# Patient Record
Sex: Female | Born: 1955
Health system: Southern US, Community
[De-identification: ages and names within clinical notes are randomized; demographics above are authoritative.]

## PROBLEM LIST (undated history)

## (undated) DIAGNOSIS — M810 Age-related osteoporosis without current pathological fracture: Secondary | ICD-10-CM

## (undated) DIAGNOSIS — I700 Atherosclerosis of aorta: Secondary | ICD-10-CM

## (undated) DIAGNOSIS — E039 Hypothyroidism, unspecified: Secondary | ICD-10-CM

## (undated) DIAGNOSIS — J449 Chronic obstructive pulmonary disease, unspecified: Secondary | ICD-10-CM

## (undated) DIAGNOSIS — J189 Pneumonia, unspecified organism: Secondary | ICD-10-CM

## (undated) DIAGNOSIS — E041 Nontoxic single thyroid nodule: Secondary | ICD-10-CM

## (undated) DIAGNOSIS — B009 Herpesviral infection, unspecified: Secondary | ICD-10-CM

## (undated) DIAGNOSIS — E785 Hyperlipidemia, unspecified: Secondary | ICD-10-CM

## (undated) DIAGNOSIS — J45909 Unspecified asthma, uncomplicated: Secondary | ICD-10-CM

## (undated) DIAGNOSIS — U071 COVID-19: Secondary | ICD-10-CM

## (undated) DIAGNOSIS — B977 Papillomavirus as the cause of diseases classified elsewhere: Secondary | ICD-10-CM

## (undated) DIAGNOSIS — I2510 Atherosclerotic heart disease of native coronary artery without angina pectoris: Secondary | ICD-10-CM

## (undated) DIAGNOSIS — K635 Polyp of colon: Secondary | ICD-10-CM

## (undated) DIAGNOSIS — D649 Anemia, unspecified: Secondary | ICD-10-CM

## (undated) DIAGNOSIS — J439 Emphysema, unspecified: Secondary | ICD-10-CM

## (undated) DIAGNOSIS — R911 Solitary pulmonary nodule: Secondary | ICD-10-CM

## (undated) HISTORY — DX: Atherosclerosis of aorta: I70.0

## (undated) HISTORY — DX: Herpesviral infection, unspecified: B00.9

## (undated) HISTORY — DX: Nontoxic single thyroid nodule: E04.1

## (undated) HISTORY — DX: Polyp of colon: K63.5

## (undated) HISTORY — DX: Age-related osteoporosis without current pathological fracture: M81.0

## (undated) HISTORY — PX: COLONOSCOPY: SHX174

## (undated) HISTORY — DX: Papillomavirus as the cause of diseases classified elsewhere: B97.7

## (undated) HISTORY — DX: COVID-19: U07.1

## (undated) HISTORY — DX: Hypothyroidism, unspecified: E03.9

## (undated) HISTORY — DX: Solitary pulmonary nodule: R91.1

## (undated) HISTORY — DX: Anemia, unspecified: D64.9

## (undated) HISTORY — DX: Emphysema, unspecified: J43.9

## (undated) HISTORY — DX: Chronic obstructive pulmonary disease, unspecified: J44.9

## (undated) HISTORY — DX: Atherosclerotic heart disease of native coronary artery without angina pectoris: I25.10

## (undated) HISTORY — DX: Unspecified asthma, uncomplicated: J45.909

## (undated) HISTORY — DX: Pneumonia, unspecified organism: J18.9

## (undated) HISTORY — DX: Hyperlipidemia, unspecified: E78.5

---

## 2009-09-28 ENCOUNTER — Ambulatory Visit: Payer: Self-pay | Admitting: Internal Medicine

## 2009-09-28 DIAGNOSIS — J029 Acute pharyngitis, unspecified: Secondary | ICD-10-CM

## 2009-09-28 DIAGNOSIS — J45909 Unspecified asthma, uncomplicated: Secondary | ICD-10-CM | POA: Insufficient documentation

## 2009-09-28 DIAGNOSIS — J4540 Moderate persistent asthma, uncomplicated: Secondary | ICD-10-CM | POA: Insufficient documentation

## 2009-09-28 DIAGNOSIS — D649 Anemia, unspecified: Secondary | ICD-10-CM | POA: Insufficient documentation

## 2009-09-28 DIAGNOSIS — E039 Hypothyroidism, unspecified: Secondary | ICD-10-CM | POA: Insufficient documentation

## 2009-09-28 HISTORY — DX: Unspecified asthma, uncomplicated: J45.909

## 2009-09-28 HISTORY — DX: Anemia, unspecified: D64.9

## 2009-09-28 HISTORY — DX: Hypothyroidism, unspecified: E03.9

## 2010-02-21 NOTE — Assessment & Plan Note (Signed)
Summary: SORE THROAT (OK PER DR K) // RS   Vital Signs:  Patient profile:   55 year old female Height:      64.5 inches Weight:      133 pounds BMI:     22.56 Temp:     98.3 degrees F oral BP sitting:   108 / 78  (right arm) Cuff size:   regular  Vitals Entered By: Duard Brady LPN (September 28, 2009 11:44 AM) CC: c/o sore throat x 2 wks on/off pain Is Patient Diabetic? Yes   CC:  c/o sore throat x 2 wks on/off pain.  History of Present Illness: 55 year old patient who is seen today with a two week history of intermittent sore throat.  She has a history of asthma.  Other symptoms include occasional postnasal drip, and intermittent productive cough.  There's been no fever, sore throat, is her primary symptom.  There has been no asthma, shortness of breath or wheezing.  She continues to smoke tobacco products.  Preventive Screening-Counseling & Management  Alcohol-Tobacco     Smoking Status: current     Smoking Cessation Counseling: yes  Allergies (verified): 1)  ! Sulfa  Past History:  Past Medical History: Hypothyroidism history of cervical dysplasia Asthma  Past Surgical History: none  Family History: Reviewed history and no changes required. father with history of prostate cancer mother history of breast cancer  Social History: Reviewed history and no changes required. Occupation:massage therapist Married Current Smoker Smoking Status:  current  Review of Systems       The patient complains of anorexia and prolonged cough.  The patient denies fever, weight loss, weight gain, vision loss, decreased hearing, hoarseness, chest pain, syncope, dyspnea on exertion, peripheral edema, headaches, hemoptysis, abdominal pain, melena, hematochezia, severe indigestion/heartburn, hematuria, incontinence, genital sores, muscle weakness, suspicious skin lesions, transient blindness, difficulty walking, depression, unusual weight change, abnormal bleeding, enlarged  lymph nodes, angioedema, and breast masses.    Physical Exam  General:  Well-developed,well-nourished,in no acute distress; alert,appropriate and cooperative throughout examination Head:  Normocephalic and atraumatic without obvious abnormalities. No apparent alopecia or balding. Eyes:  No corneal or conjunctival inflammation noted. EOMI. Perrla. Funduscopic exam benign, without hemorrhages, exudates or papilledema. Vision grossly normal. Ears:  erythema of the oral pharynx with tonsillar enlargement Mouth:  Oral mucosa and oropharynx without lesions or exudates.  Teeth in good repair. Neck:  No deformities, masses, or tenderness noted. Chest Wall:  No deformities, masses, or tenderness noted. Lungs:  Normal respiratory effort, chest expands symmetrically. Lungs are clear to auscultation, no crackles or wheezes. Abdomen:  Bowel sounds positive,abdomen soft and non-tender without masses, organomegaly or hernias noted.   Impression & Recommendations:  Problem # 1:  PHARYNGITIS-ACUTE (ICD-462)  Problem # 2:  HYPOTHYROIDISM (ICD-244.9)  Complete Medication List: 1)  Theophylline Cr 300 Mg Xr12h-tab (Theophylline) .... Bid 2)  Albuterol Sulfate (2.5 Mg/80ml) 0.083% Nebu (Albuterol sulfate) .... Neb tx 3)  Ipratropium Bromide 0.02 % Soln (Ipratropium bromide) .... Ned tx 4)  Nature-throid Tabs (thyroid)  .... Qd 5)  Progesterone Compound   Other Orders: Rapid Strep (16109)  Patient Instructions: 1)  Tobacco is very bad for your health and your loved ones! You Should stop smoking!. 2)  Take 400-600mg  of Ibuprofen (Advil, Motrin) with food every 4-6 hours as needed for relief of pain or comfort of fever.  Laboratory Results  Date/Time Received: September 28, 2009 12:05 PM  Date/Time Reported: September 28, 2009 12:05 PM  Other Tests  Rapid Strep: negative

## 2010-04-05 ENCOUNTER — Encounter: Payer: Self-pay | Admitting: Internal Medicine

## 2010-04-06 ENCOUNTER — Ambulatory Visit (INDEPENDENT_AMBULATORY_CARE_PROVIDER_SITE_OTHER): Payer: Self-pay | Admitting: Internal Medicine

## 2010-04-06 ENCOUNTER — Encounter: Payer: Self-pay | Admitting: Internal Medicine

## 2010-04-06 VITALS — BP 110/78 | Temp 98.8°F | Ht 64.5 in | Wt 132.0 lb

## 2010-04-06 DIAGNOSIS — G4700 Insomnia, unspecified: Secondary | ICD-10-CM

## 2010-04-06 DIAGNOSIS — J45909 Unspecified asthma, uncomplicated: Secondary | ICD-10-CM

## 2010-04-06 MED ORDER — ALPRAZOLAM 0.5 MG PO TABS: 0.5000 mg | ORAL_TABLET | Freq: Every evening | ORAL | Status: AC | PRN

## 2010-04-06 MED ORDER — FLUTICASONE PROPIONATE HFA 220 MCG/ACT IN AERO: 2.0000 | INHALATION_SPRAY | Freq: Two times a day (BID) | RESPIRATORY_TRACT | Status: DC

## 2010-04-06 MED ORDER — FLUCONAZOLE 150 MG PO TABS: 150.0000 mg | ORAL_TABLET | Freq: Once | ORAL | Status: AC

## 2010-04-06 NOTE — Patient Instructions (Signed)
Smoking tobacco is very bad for your health. You should stop smoking immediately.  Call or return to clinic prn if these symptoms worsen or fail to improve as anticipated.  

## 2010-04-06 NOTE — Progress Notes (Signed)
  Subjective:    Patient ID: Barbara Lawson, female    DOB: 1955/09/16, 55 y.o.   MRN: [id number]  HPI  a 55 year old patient who is seen in frequently with a history of asthma. She does quite well clinically and has no health insurance. She does use home nebulizer treatments but states she gets very little benefit from rescue MDI. She has been given samples of  Dulera  Which he has not been able to afford. She has done well with Flovent maintenance medication in the past. Her progress status seems fairly stable but does have daily symptoms requiring nebulizer treatments. She has a history of mild thyroid enlargement for which she takes low-dose suppressive therapy. She continues to smoke tobacco unfortunately.  Review of Systems  Constitutional: Negative.   HENT: Negative for hearing loss, congestion, sore throat, rhinorrhea, dental problem, sinus pressure and tinnitus.   Eyes: Negative for pain, discharge and visual disturbance.  Respiratory: Positive for wheezing. Negative for cough and shortness of breath.   Cardiovascular: Negative for chest pain, palpitations and leg swelling.  Gastrointestinal: Negative for nausea, vomiting, abdominal pain, diarrhea, constipation, blood in stool and abdominal distention.  Genitourinary: Negative for dysuria, urgency, frequency, hematuria, flank pain, vaginal bleeding, vaginal discharge, difficulty urinating, vaginal pain and pelvic pain.  Musculoskeletal: Negative for joint swelling, arthralgias and gait problem.  Skin: Negative for rash.  Neurological: Negative for dizziness, syncope, speech difficulty, weakness, numbness and headaches.  Hematological: Negative for adenopathy.  Psychiatric/Behavioral: Negative for behavioral problems, dysphoric mood and agitation. The patient is not nervous/anxious.        Objective:   Physical Exam  Constitutional: She is oriented to person, place, and time. She appears well-developed and well-nourished.  HENT:    Head: Normocephalic.  Right Ear: External ear normal.  Left Ear: External ear normal.  Mouth/Throat: Oropharynx is clear and moist.  Eyes: Conjunctivae and EOM are normal. Pupils are equal, round, and reactive to light.  Neck: Normal range of motion. Neck supple. No thyromegaly present.  Cardiovascular: Normal rate, regular rhythm, normal heart sounds and intact distal pulses.   Pulmonary/Chest: Effort normal. No respiratory distress. She has no wheezes. She has no rales.  Abdominal: Soft. Bowel sounds are normal. She exhibits no mass. There is no tenderness.  Musculoskeletal: Normal range of motion.  Lymphadenopathy:    She has no cervical adenopathy.  Neurological: She is alert and oriented to person, place, and time.  Skin: Skin is warm and dry. No rash noted.  Psychiatric: She has a normal mood and affect. Her behavior is normal.          Assessment & Plan:   chronic stable asthma. We'll give a prescription for Flovent. Samples of Dulera  Also dispensed. She will continue home nebulizer treatments when necessary. She does not  Wish  a prescription for rescue albuterol MDI which she feels is not effective

## 2011-03-26 ENCOUNTER — Telehealth: Payer: Self-pay

## 2011-03-26 NOTE — Telephone Encounter (Signed)
.  [facility] Barbara Lawson STATES SHE NEED RECORDS ON HER DAUGHTER AND HER DAUGHTER IS FAXING OVER A RELEASE FOR HER TO OBTAIN THEM REALLY NEED ALL SHOT RECORDS. PLEASE CALL [phone] WHEN READY FOR P/U WAS TOLD IT MAY BE 24-48 HRS.

## 2013-02-05 ENCOUNTER — Ambulatory Visit (INDEPENDENT_AMBULATORY_CARE_PROVIDER_SITE_OTHER): Payer: Self-pay | Admitting: Internal Medicine

## 2013-02-05 ENCOUNTER — Encounter: Payer: Self-pay | Admitting: Internal Medicine

## 2013-02-05 VITALS — BP 116/70 | HR 90 | Temp 98.4°F | Resp 20 | Ht 64.5 in | Wt 126.0 lb

## 2013-02-05 DIAGNOSIS — B9789 Other viral agents as the cause of diseases classified elsewhere: Principal | ICD-10-CM

## 2013-02-05 DIAGNOSIS — J069 Acute upper respiratory infection, unspecified: Secondary | ICD-10-CM

## 2013-02-05 MED ORDER — HYDROCODONE-HOMATROPINE 5-1.5 MG/5ML PO SYRP: 5.0000 mL | ORAL_SOLUTION | Freq: Four times a day (QID) | ORAL | Status: AC | PRN

## 2013-02-05 MED ORDER — AZITHROMYCIN 250 MG PO TABS: ORAL_TABLET | ORAL | Status: DC

## 2013-02-05 NOTE — Progress Notes (Signed)
Subjective:    Patient ID: Barbara Lawson, female    DOB: June 24, 1955, 58 y.o.   MRN: [id number]  HPI  58 year old patient who presents with a ten-day history of worsening cough congestion and some wheezing. She is a former smoker but quit 10 days ago at the onset of her acute illness. Cough has become productive of yellow and green sputum. She has developed a sinus congestion and right-sided headaches.  Past Medical History  Diagnosis Date  . ANEMIA-NOS 09/28/2009  . ASTHMA 09/28/2009  . HYPOTHYROIDISM 09/28/2009    History   Social History  . Marital Status: Married    Spouse Name: N/A    Number of Children: N/A  . Years of Education: N/A   Occupational History  . Not on file.   Social History Main Topics  . Smoking status: Former Smoker -- 1.00 packs/day    Types: Cigarettes    Quit date: 01/26/2013  . Smokeless tobacco: Not on file  . Alcohol Use: 1.5 oz/week    3 drink(s) per week  . Drug Use: No  . Sexual Activity: Not on file   Other Topics Concern  . Not on file   Social History Narrative  . No narrative on file    History reviewed. No pertinent past surgical history.  No family history on file.  Allergies  Allergen Reactions  . Sulfonamide Derivatives     Current Outpatient Prescriptions on File Prior to Visit  Medication Sig Dispense Refill  . albuterol (PROVENTIL) (2.5 MG/3ML) 0.083% nebulizer solution Take 2.5 mg by nebulization every 6 (six) hours as needed.        Marland Kitchen ipratropium (ATROVENT) 0.02 % nebulizer solution Take 500 mcg by nebulization 4 (four) times daily.        Marland Kitchen PROGESTERONE MICRONIZED PO Take by mouth daily.        . theophylline (THEOPHYLLINE) 300 MG 12 hr tablet Take 300 mg by mouth 2 (two) times daily.        . Thyroid (NATURE-THROID PO) Take by mouth.         No current facility-administered medications on file prior to visit.    BP 116/70  Pulse 90  Temp(Src) 98.4 F (36.9 C) (Oral)  Resp 20  Ht 5' 4.5" (1.638 m)  Wt 126 lb  (57.153 kg)  BMI 21.30 kg/m2  SpO2 97%       Review of Systems  Constitutional: Positive for activity change, appetite change and fatigue.  HENT: Positive for rhinorrhea and sinus pressure. Negative for congestion, dental problem, hearing loss, sore throat and tinnitus.   Eyes: Negative for pain, discharge and visual disturbance.  Respiratory: Positive for cough. Negative for shortness of breath.   Cardiovascular: Negative for chest pain, palpitations and leg swelling.  Gastrointestinal: Negative for nausea, vomiting, abdominal pain, diarrhea, constipation, blood in stool and abdominal distention.  Genitourinary: Negative for dysuria, urgency, frequency, hematuria, flank pain, vaginal bleeding, vaginal discharge, difficulty urinating, vaginal pain and pelvic pain.  Musculoskeletal: Negative for arthralgias, gait problem and joint swelling.  Skin: Negative for rash.  Neurological: Positive for headaches. Negative for dizziness, syncope, speech difficulty, weakness and numbness.  Hematological: Negative for adenopathy.  Psychiatric/Behavioral: Negative for behavioral problems, dysphoric mood and agitation. The patient is not nervous/anxious.        Objective:   Physical Exam  Constitutional: She is oriented to person, place, and time. She appears well-developed and well-nourished.  HENT:  Head: Normocephalic.  Right Ear: External ear normal.  Left Ear: External ear normal.  Mouth/Throat: Oropharynx is clear and moist.  Eyes: Conjunctivae and EOM are normal. Pupils are equal, round, and reactive to light.  Neck: Normal range of motion. Neck supple. No thyromegaly present.  Cardiovascular: Normal rate, regular rhythm, normal heart sounds and intact distal pulses.   Pulmonary/Chest: Effort normal and breath sounds normal.  Bilateral coarse rhonchi. Frequent paroxysms of cough. O2 saturation 97%  Abdominal: Soft. Bowel sounds are normal. She exhibits no mass. There is no tenderness.    Musculoskeletal: Normal range of motion.  Lymphadenopathy:    She has no cervical adenopathy.  Neurological: She is alert and oriented to person, place, and time.  Skin: Skin is warm and dry. No rash noted.  Psychiatric: She has a normal mood and affect. Her behavior is normal.          Assessment & Plan:   COPD exacerbation. Continue smoking cessation encouraged. We'll treat with expectorants antitussives and azithromycin

## 2013-02-05 NOTE — Progress Notes (Signed)
Pre-visit discussion using our clinic review tool. No additional management support is needed unless otherwise documented below in the visit note.  

## 2013-02-05 NOTE — Patient Instructions (Signed)
Smoking tobacco is very bad for your health. You should stop smoking immediately.    Take over-the-counter expectorants and cough medications such as  Mucinex DM.  Call if there is no improvement in 5 to 7 days or if he developed worsening cough, fever, or new symptoms, such as shortness of breath or chest pain.  Take your antibiotic as prescribed until ALL of it is gone, but stop if you develop a rash, swelling, or any side effects of the medication.  Contact our office as soon as possible if  there are side effects of the medication.

## 2013-06-02 ENCOUNTER — Ambulatory Visit (INDEPENDENT_AMBULATORY_CARE_PROVIDER_SITE_OTHER): Payer: Self-pay | Admitting: Internal Medicine

## 2013-06-02 ENCOUNTER — Encounter: Payer: Self-pay | Admitting: Internal Medicine

## 2013-06-02 ENCOUNTER — Telehealth: Payer: Self-pay | Admitting: Internal Medicine

## 2013-06-02 VITALS — BP 100/68 | HR 75 | Temp 98.1°F | Resp 18 | Ht 64.5 in | Wt 126.0 lb

## 2013-06-02 DIAGNOSIS — J329 Chronic sinusitis, unspecified: Secondary | ICD-10-CM

## 2013-06-02 DIAGNOSIS — J45909 Unspecified asthma, uncomplicated: Secondary | ICD-10-CM

## 2013-06-02 DIAGNOSIS — E039 Hypothyroidism, unspecified: Secondary | ICD-10-CM

## 2013-06-02 MED ORDER — DOXYCYCLINE HYCLATE 100 MG PO TABS: 100.0000 mg | ORAL_TABLET | Freq: Two times a day (BID) | ORAL | Status: DC

## 2013-06-02 MED ORDER — BECLOMETHASONE DIPROPIONATE 80 MCG/ACT IN AERS: 1.0000 | INHALATION_SPRAY | Freq: Two times a day (BID) | RESPIRATORY_TRACT | Status: DC

## 2013-06-02 MED ORDER — ALPRAZOLAM 0.25 MG PO TABS: 0.2500 mg | ORAL_TABLET | Freq: Every evening | ORAL | Status: DC | PRN

## 2013-06-02 NOTE — Telephone Encounter (Signed)
Called received from Jefferson at [facility], [place]) to get clarification on rx written today for ALPRAZolam (XANAX) 0.25 MG tablet.  Per Jaclyn Shaggy pt has been getting clonazepam filled that is being prescribed by another provider.  Will the rx for alprazolam wrote today replace the clonazepam?

## 2013-06-02 NOTE — Telephone Encounter (Signed)
FYI - spoke with pharmacist.  Patient has called today and requested a refill of Klonopin from Dr Laurence Aly in [facility].  Patient has been informed that she can not have both prescriptions (alprazolam and Klonopin).

## 2013-06-02 NOTE — Progress Notes (Signed)
Pre-visit discussion using our clinic review tool. No additional management support is needed unless otherwise documented below in the visit note.  

## 2013-06-02 NOTE — Progress Notes (Signed)
Subjective:    Patient ID: Barbara Lawson, female    DOB: [birth year], 58 y.o.   MRN: [id number]  HPI  58 year old patient who has a history of asthma.  She has been on Proventil and also theophylline for a number of years.  She discontinued tobacco use in January of this year and has had some chronic insomnia.  For the past 2 weeks has had increasing cough, sinus pain, congestion, and intermittent, low-grade fever. Her asthma generally has been improved.  She still smokes sporadically, but rarely and does use nicotine gum  Past Medical History  Diagnosis Date  . ANEMIA-NOS 09/28/2009  . ASTHMA 09/28/2009  . HYPOTHYROIDISM 09/28/2009    History   Social History  . Marital Status: Married    Spouse Name: N/A    Number of Children: N/A  . Years of Education: N/A   Occupational History  . Not on file.   Social History Main Topics  . Smoking status: Former Smoker -- 1.00 packs/day    Types: Cigarettes    Quit date: 01/26/2013  . Smokeless tobacco: Not on file  . Alcohol Use: 1.5 oz/week    3 drink(s) per week  . Drug Use: No  . Sexual Activity: Not on file   Other Topics Concern  . Not on file   Social History Narrative  . No narrative on file    History reviewed. No pertinent past surgical history.  No family history on file.  Allergies  Allergen Reactions  . Sulfonamide Derivatives     Current Outpatient Prescriptions on File Prior to Visit  Medication Sig Dispense Refill  . albuterol (PROVENTIL) (2.5 MG/3ML) 0.083% nebulizer solution Take 2.5 mg by nebulization every 6 (six) hours as needed.        Marland Kitchen ipratropium (ATROVENT) 0.02 % nebulizer solution Take 500 mcg by nebulization 4 (four) times daily.        Marland Kitchen PROGESTERONE MICRONIZED PO Take by mouth daily.        . Thyroid (NATURE-THROID PO) Take by mouth.         No current facility-administered medications on file prior to visit.    BP 100/68  Pulse 75  Temp(Src) 98.1 F (36.7 C) (Oral)  Resp 18  Ht 5'  4.5" (1.638 m)  Wt 126 lb (57.153 kg)  BMI 21.30 kg/m2  SpO2 98%       Review of Systems  Constitutional: Positive for fever, activity change and fatigue.  HENT: Positive for congestion, postnasal drip and sinus pressure. Negative for dental problem, hearing loss, rhinorrhea, sore throat and tinnitus.   Eyes: Negative for pain, discharge and visual disturbance.  Respiratory: Positive for cough. Negative for shortness of breath.   Cardiovascular: Negative for chest pain, palpitations and leg swelling.  Gastrointestinal: Negative for nausea, vomiting, abdominal pain, diarrhea, constipation, blood in stool and abdominal distention.  Genitourinary: Negative for dysuria, urgency, frequency, hematuria, flank pain, vaginal bleeding, vaginal discharge, difficulty urinating, vaginal pain and pelvic pain.  Musculoskeletal: Negative for arthralgias, gait problem and joint swelling.  Skin: Negative for rash.  Neurological: Positive for headaches. Negative for dizziness, syncope, speech difficulty, weakness and numbness.  Hematological: Negative for adenopathy.  Psychiatric/Behavioral: Negative for behavioral problems, dysphoric mood and agitation. The patient is not nervous/anxious.        Objective:   Physical Exam  Constitutional: She is oriented to person, place, and time. She appears well-developed and well-nourished.  HENT:  Head: Normocephalic.  Right Ear: External ear normal.  Left Ear: External ear normal.  Mouth/Throat: Oropharynx is clear and moist.  Eyes: Conjunctivae and EOM are normal. Pupils are equal, round, and reactive to light.  Neck: Normal range of motion. Neck supple. No thyromegaly present.  Cardiovascular: Normal rate, regular rhythm, normal heart sounds and intact distal pulses.   Pulmonary/Chest: Effort normal and breath sounds normal.  Abdominal: Soft. Bowel sounds are normal. She exhibits no mass. There is no tenderness.  Musculoskeletal: Normal range of motion.    Lymphadenopathy:    She has no cervical adenopathy.  Neurological: She is alert and oriented to person, place, and time.  Skin: Skin is warm and dry. No rash noted.  Psychiatric: She has a normal mood and affect. Her behavior is normal.          Assessment & Plan:   Subacute sinusitis.  Will treatment with doxycycline for 10 days Asthma.  We'll taper and discontinue theophylline and substitute ICS Insomnia.  Hopefully, improve off theophylline and once acute illness, resolved.  We'll treat with short term Xanax per patient request No health insurance.  CPX when available

## 2013-06-02 NOTE — Telephone Encounter (Signed)
Yes alprazolam will replace Klonopin

## 2013-06-02 NOTE — Patient Instructions (Signed)
Taper and discontinue theophylline   Use saline irrigation, warm  moist compresses and over-the-counter decongestants only as directed.  Call if there is no improvement in 5 to 7 days, or sooner if you develop increasing pain, fever, or any new symptoms.

## 2014-06-16 ENCOUNTER — Telehealth: Payer: Self-pay | Admitting: Family Medicine

## 2014-06-16 NOTE — Telephone Encounter (Signed)
Left a message for the pt to return my call.  Need to see if she has had her annual mammogram.  If so, where and when.

## 2015-02-16 DIAGNOSIS — E8941 Symptomatic postprocedural ovarian failure: Secondary | ICD-10-CM | POA: Insufficient documentation

## 2015-02-16 DIAGNOSIS — N952 Postmenopausal atrophic vaginitis: Secondary | ICD-10-CM | POA: Insufficient documentation

## 2015-09-06 DIAGNOSIS — Z8619 Personal history of other infectious and parasitic diseases: Secondary | ICD-10-CM | POA: Insufficient documentation

## 2015-10-03 DIAGNOSIS — R059 Cough, unspecified: Secondary | ICD-10-CM | POA: Insufficient documentation

## 2015-10-03 DIAGNOSIS — R490 Dysphonia: Secondary | ICD-10-CM | POA: Insufficient documentation

## 2015-12-27 DIAGNOSIS — Z1211 Encounter for screening for malignant neoplasm of colon: Secondary | ICD-10-CM | POA: Insufficient documentation

## 2015-12-27 DIAGNOSIS — Z0000 Encounter for general adult medical examination without abnormal findings: Secondary | ICD-10-CM | POA: Insufficient documentation

## 2016-02-03 DIAGNOSIS — F419 Anxiety disorder, unspecified: Secondary | ICD-10-CM | POA: Insufficient documentation

## 2016-12-15 DIAGNOSIS — D72825 Bandemia: Secondary | ICD-10-CM | POA: Insufficient documentation

## 2017-01-21 LAB — HM COLONOSCOPY

## 2017-03-28 LAB — HM PAP SMEAR: HM Pap smear: NEGATIVE

## 2018-12-03 DIAGNOSIS — S93401A Sprain of unspecified ligament of right ankle, initial encounter: Secondary | ICD-10-CM | POA: Insufficient documentation

## 2019-08-24 LAB — HM DEXA SCAN

## 2019-12-09 LAB — HM MAMMOGRAPHY

## 2020-01-06 DIAGNOSIS — R82994 Hypercalciuria: Secondary | ICD-10-CM | POA: Insufficient documentation

## 2020-05-31 ENCOUNTER — Telehealth: Payer: Self-pay | Admitting: Internal Medicine

## 2020-05-31 ENCOUNTER — Encounter: Payer: Self-pay | Admitting: Internal Medicine

## 2020-05-31 ENCOUNTER — Other Ambulatory Visit: Payer: Self-pay

## 2020-05-31 ENCOUNTER — Ambulatory Visit (INDEPENDENT_AMBULATORY_CARE_PROVIDER_SITE_OTHER): Payer: Medicare Other | Admitting: Internal Medicine

## 2020-05-31 VITALS — BP 106/78 | HR 99 | Temp 98.5°F | Ht 63.86 in | Wt 117.0 lb

## 2020-05-31 DIAGNOSIS — I2510 Atherosclerotic heart disease of native coronary artery without angina pectoris: Secondary | ICD-10-CM | POA: Insufficient documentation

## 2020-05-31 DIAGNOSIS — R82994 Hypercalciuria: Secondary | ICD-10-CM

## 2020-05-31 DIAGNOSIS — J449 Chronic obstructive pulmonary disease, unspecified: Secondary | ICD-10-CM | POA: Diagnosis not present

## 2020-05-31 DIAGNOSIS — R634 Abnormal weight loss: Secondary | ICD-10-CM

## 2020-05-31 DIAGNOSIS — E559 Vitamin D deficiency, unspecified: Secondary | ICD-10-CM

## 2020-05-31 DIAGNOSIS — M810 Age-related osteoporosis without current pathological fracture: Secondary | ICD-10-CM

## 2020-05-31 DIAGNOSIS — E041 Nontoxic single thyroid nodule: Secondary | ICD-10-CM

## 2020-05-31 DIAGNOSIS — E785 Hyperlipidemia, unspecified: Secondary | ICD-10-CM

## 2020-05-31 DIAGNOSIS — E039 Hypothyroidism, unspecified: Secondary | ICD-10-CM

## 2020-05-31 DIAGNOSIS — R918 Other nonspecific abnormal finding of lung field: Secondary | ICD-10-CM

## 2020-05-31 DIAGNOSIS — Z1231 Encounter for screening mammogram for malignant neoplasm of breast: Secondary | ICD-10-CM

## 2020-05-31 DIAGNOSIS — Z0000 Encounter for general adult medical examination without abnormal findings: Secondary | ICD-10-CM

## 2020-05-31 DIAGNOSIS — R911 Solitary pulmonary nodule: Secondary | ICD-10-CM | POA: Diagnosis not present

## 2020-05-31 DIAGNOSIS — Z124 Encounter for screening for malignant neoplasm of cervix: Secondary | ICD-10-CM

## 2020-05-31 DIAGNOSIS — Z13818 Encounter for screening for other digestive system disorders: Secondary | ICD-10-CM

## 2020-05-31 DIAGNOSIS — B977 Papillomavirus as the cause of diseases classified elsewhere: Secondary | ICD-10-CM

## 2020-05-31 DIAGNOSIS — I10 Essential (primary) hypertension: Secondary | ICD-10-CM

## 2020-05-31 DIAGNOSIS — K635 Polyp of colon: Secondary | ICD-10-CM

## 2020-05-31 DIAGNOSIS — N83202 Unspecified ovarian cyst, left side: Secondary | ICD-10-CM

## 2020-05-31 DIAGNOSIS — Z87891 Personal history of nicotine dependence: Secondary | ICD-10-CM

## 2020-05-31 DIAGNOSIS — E7841 Elevated Lipoprotein(a): Secondary | ICD-10-CM

## 2020-05-31 DIAGNOSIS — Z1389 Encounter for screening for other disorder: Secondary | ICD-10-CM

## 2020-05-31 DIAGNOSIS — E069 Thyroiditis, unspecified: Secondary | ICD-10-CM

## 2020-05-31 DIAGNOSIS — B009 Herpesviral infection, unspecified: Secondary | ICD-10-CM

## 2020-05-31 DIAGNOSIS — I700 Atherosclerosis of aorta: Secondary | ICD-10-CM

## 2020-05-31 DIAGNOSIS — E7849 Other hyperlipidemia: Secondary | ICD-10-CM

## 2020-05-31 MED ORDER — VALACYCLOVIR HCL 1 G PO TABS: ORAL_TABLET | ORAL | 3 refills | Status: DC

## 2020-05-31 MED ORDER — HYDROCHLOROTHIAZIDE 25 MG PO TABS: 12.5000 mg | ORAL_TABLET | Freq: Every day | ORAL | 0 refills | Status: DC

## 2020-05-31 MED ORDER — ATORVASTATIN CALCIUM 10 MG PO TABS: 10.0000 mg | ORAL_TABLET | Freq: Every day | ORAL | 3 refills | Status: DC

## 2020-05-31 MED ORDER — THYROID 15 MG PO TABS: ORAL_TABLET | ORAL | Status: DC

## 2020-05-31 NOTE — Progress Notes (Signed)
Chief Complaint  Patient presents with  . Establish Care   New patient complicated with multiple medical chronic conditions stable moved from [place] and needs to establish new specialists  1. Hypothyroidism with h/o thyroiditis (I.e hashimotos), thyroid nodules right side on armour thyroid 45 mg M-F and 30 mg Sat and Sunday TSH uncontrolled with h/o osteoporosis on actonel 150 mg Q30 days and h/o hypercalciuria will refer to endocrine  Results for Barbara, Lawson (MRN [id number]) as of 06/28/2020 18:31  06/23/2020 08:55 TSH: 4.90 (H) Triiodothyronine,Free,Serum: 3.9 T4,Free(Direct): 0.84 Thyroperoxidase Ab SerPl-aCnc: 183 (H)  2.htn BP controlled on hctz 12.5 or 25 mg qd /familial hld with h/o elevated lipoprotein A, CAD, AA -will refer to cardiology Dr. Debara Pickett and [facility] cards stable no chest pain just establish care  She is on lipitor 10 mg qhs lipid normal  Results for Barbara, Lawson (MRN [id number]) as of 06/28/2020 18:50  06/23/2020 08:55 Total CHOL/HDL Ratio: 3 Cholesterol: 164 HDL Cholesterol: 56.00 LDL (calc): 93 NonHDL: 108.25 Triglycerides: 78.0 VLDL: 15.6   3. H/o herpes and takes prn valtrex will refill  4. H/o moderate persistent asthma/COPD stable, former smoker and copd on symbicort, prn combivent with h/o lung nodules see prior CT chest in A/P of note  She needs to establish with new pulmonologist refer [facility] in [place] on theophylline 200 mg in am and 1/2 pill qhs I do not Rx this informed pt   5. H/o colon polyps will refer to [facility] in [place] for f/u h/o 20+polyps in the past last [facility] [place]  6. H/o anxiety/chronic insomnia her holistic md Rx klonopin taking 0.25 mg qhs and hydroxyzine 50 mg qhs and hydrocodone 7.5-325 mg qd  7. H/o abnormal pap needs to see ob/gyn for f/u  Will refer     Review of Systems  Constitutional: Negative for weight loss.  HENT: Negative for hearing loss.   Eyes: Negative for blurred vision.  Respiratory: Negative for  shortness of breath.   Cardiovascular: Negative for chest pain.  Gastrointestinal: Negative for abdominal pain.  Musculoskeletal: Negative for back pain, falls, joint pain and neck pain.  Skin: Negative for rash.  Neurological: Negative for headaches.  Psychiatric/Behavioral: The patient is nervous/anxious and has insomnia.    Past Medical History:  Diagnosis Date  . ANEMIA-NOS 09/28/2009  . Aortic atherosclerosis ([facility])   . ASTHMA 09/28/2009   moderate persistent   . CAD (coronary artery disease)   . Colon polyps   . COPD (chronic obstructive pulmonary disease) ([place])   . COVID-19    01/20/2020  . Emphysema lung ([facility])    vs bronchitis;paraseptal   . Herpes   . HPV in female    hpv 20+ years   . Hyperlipemia   . HYPOTHYROIDISM 09/28/2009  . Lung nodule   . Osteoporosis   . Pneumonia    hosp in 2018  . Thyroid nodule    right thyroid 5.2 x 1.8 x 1.9 cm right lobe   Past Surgical History:  Procedure Laterality Date  . COLONOSCOPY     Family History  Problem Relation Age of Onset  . Cancer Mother   . Asthma Father   . Cancer Father   . Hyperlipidemia Father   . Hyperlipidemia Brother    Social History   Socioeconomic History  . Marital status: Married    Spouse name: Not on file  . Number of children: Not on file  . Years of education: Not on file  . Highest education  level: Not on file  Occupational History  . Not on file  Tobacco Use  . Smoking status: Former Smoker    Packs/day: 1.00    Types: Cigarettes    Quit date: 01/26/2013    Years since quitting: 7.4  . Smokeless tobacco: Never Used  Substance and Sexual Activity  . Alcohol use: Yes    Alcohol/week: 3.0 standard drinks    Types: 3 Standard drinks or equivalent per week  . Drug use: No  . Sexual activity: Not on file  Other Topics Concern  . Not on file  Social History Narrative   Lives alone with dog    [place]    Bachelors degree massage therapy    Age 29 y.o abortion    lmp age 8     Social Determinants of Health   Financial Resource Strain: Not on file  Food Insecurity: Not on file  Transportation Needs: Not on file  Physical Activity: Not on file  Stress: Not on file  Social Connections: Not on file  Intimate Partner Violence: Not on file   Current Meds  Medication Sig  . ascorbic acid (VITAMIN C) 1000 MG tablet Take by mouth.  . budesonide-formoterol (SYMBICORT) 80-4.5 MCG/ACT inhaler Inhale into the lungs.  . calcium citrate-vitamin D (CITRACAL+D) 315-200 MG-UNIT tablet Take 2 tablets by mouth daily.  . clonazePAM (KLONOPIN) 0.5 MG tablet Take 0.25 mg by mouth at bedtime. Outside prescriber holistic MD Dr. Cherre Blanc  . hydrochlorothiazide (HYDRODIURIL) 25 MG tablet Take 0.5 tablets (12.5 mg total) by mouth daily. (Patient taking differently: Take 25 mg by mouth daily. Verify dose at f/u 12.5 or 25 mg qd)  . HYDROcodone-acetaminophen (NORCO) 7.5-325 MG tablet Take by mouth. From holistic MD Dr. Cherre Blanc  . hydrOXYzine (VISTARIL) 25 MG capsule Take 50 mg by mouth at bedtime.  Marland Kitchen ipratropium (ATROVENT) 0.02 % nebulizer solution Take 500 mcg by nebulization 4 (four) times daily.  . Ipratropium-Albuterol (COMBIVENT RESPIMAT) 20-100 MCG/ACT AERS respimat INHALE ONE PUFF INTO THE LUNGS 4 TIMES DAILY  . Ipratropium-Albuterol (COMBIVENT RESPIMAT) 20-100 MCG/ACT AERS respimat Inhale 1 puff into the lungs every 6 (six) hours as needed for wheezing.  . progesterone (PROMETRIUM) 100 MG capsule Take 200 mg by mouth daily.  Marland Kitchen PROGESTERONE MICRONIZED PO Take by mouth daily.  . risedronate (ACTONEL) 150 MG tablet Take 150 mg by mouth every 30 (thirty) days.  . theophylline (THEODUR) 300 MG 12 hr tablet Take by mouth in the morning and at bedtime. Whole pill in the morning, half pill at night  . [DISCONTINUED] atorvastatin (LIPITOR) 10 MG tablet Take 1 tablet by mouth daily.  . [DISCONTINUED] hydrochlorothiazide (HYDRODIURIL) 25 MG tablet Take 25 mg by  mouth daily.  . [DISCONTINUED] thyroid (ARMOUR) 15 MG tablet TAKE 3 TABLETS BY MOUTH EVERY DAY EXCEPT 2 TABLETS ON SATURDAY AND SUNDAY  . [DISCONTINUED] valACYclovir (VALTREX) 1000 MG tablet Take by mouth.   Allergies  Allergen Reactions  . Cat Hair Extract   . Dust Mite Extract   . Sulfonamide Derivatives    Recent Results (from the past 2160 hour(s))  Hepatitis C antibody     Status: None   Collection Time: 06/23/20  8:55 AM  Result Value Ref Range   Hepatitis C Ab NON-REACTIVE NON-REACTIVE   SIGNAL TO CUT-OFF 0.00 <1.00    Comment: . HCV antibody was non-reactive. There is no laboratory  evidence of HCV infection. . In most cases, no further action is required. However,  if recent HCV exposure is suspected, a test for HCV RNA (test code 7371631822) is suggested. . For additional information please refer to http://education.questdiagn[facility].com/faq/FAQ22v1 (This link is being provided for informational/ educational purposes only.) .   Vitamin D (25 hydroxy)     Status: None   Collection Time: 06/23/20  8:55 AM  Result Value Ref Range   VITD 49.00 30.00 - 100.00 ng/mL  T3, free     Status: None   Collection Time: 06/23/20  8:55 AM  Result Value Ref Range   T3, Free 3.9 2.3 - 4.2 pg/mL  T4, free     Status: None   Collection Time: 06/23/20  8:55 AM  Result Value Ref Range   Free T4 0.84 0.60 - 1.60 ng/dL    Comment: Specimens from patients who are undergoing biotin therapy and /or ingesting biotin supplements may contain high levels of biotin.  The higher biotin concentration in these specimens interferes with this Free T4 assay.  Specimens that contain high levels  of biotin may cause false high results for this Free T4 assay.  Please interpret results in light of the total clinical presentation of the patient.    Thyroid peroxidase antibody     Status: Abnormal   Collection Time: 06/23/20  8:55 AM  Result Value Ref Range   Thyroperoxidase Ab SerPl-aCnc 183 (H) 0 - 34 IU/mL   Urinalysis, Routine w reflex microscopic     Status: None   Collection Time: 06/23/20  8:55 AM  Result Value Ref Range   Specific Gravity, UA 1.007 1.005 - 1.030   pH, UA 7.0 5.0 - 7.5   Color, UA Yellow Yellow   Appearance Ur Clear Clear   Leukocytes,UA Negative Negative   Protein,UA Negative Negative/Trace   Glucose, UA Negative Negative   Ketones, UA Negative Negative   RBC, UA Negative Negative   Bilirubin, UA Negative Negative   Urobilinogen, Ur 0.2 0.2 - 1.0 mg/dL   Nitrite, UA Negative Negative   Microscopic Examination Comment     Comment: Microscopic not indicated and not performed.  TSH     Status: Abnormal   Collection Time: 06/23/20  8:55 AM  Result Value Ref Range   TSH 4.90 (H) 0.35 - 4.50 uIU/mL  CBC with Differential/Platelet     Status: None   Collection Time: 06/23/20  8:55 AM  Result Value Ref Range   WBC 8.3 4.0 - 10.5 K/uL   RBC 4.46 3.87 - 5.11 Mil/uL   Hemoglobin 13.4 12.0 - 15.0 g/dL   HCT 40.1 36.0 - 46.0 %   MCV 89.8 78.0 - 100.0 fl   MCHC 33.5 30.0 - 36.0 g/dL   RDW 13.8 11.5 - 15.5 %   Platelets 370.0 150.0 - 400.0 K/uL   Neutrophils Relative % 65.6 43.0 - 77.0 %   Lymphocytes Relative 23.1 12.0 - 46.0 %   Monocytes Relative 7.3 3.0 - 12.0 %   Eosinophils Relative 2.9 0.0 - 5.0 %   Basophils Relative 1.1 0.0 - 3.0 %   Neutro Abs 5.4 1.4 - 7.7 K/uL   Lymphs Abs 1.9 0.7 - 4.0 K/uL   Monocytes Absolute 0.6 0.1 - 1.0 K/uL   Eosinophils Absolute 0.2 0.0 - 0.7 K/uL   Basophils Absolute 0.1 0.0 - 0.1 K/uL  Lipid panel     Status: None   Collection Time: 06/23/20  8:55 AM  Result Value Ref Range   Cholesterol 164 0 - 200 mg/dL    Comment: ATP III  Classification       Desirable:  < 200 mg/dL               Borderline High:  200 - 239 mg/dL          High:  > = 240 mg/dL   Triglycerides 78.0 0.0 - 149.0 mg/dL    Comment: Normal:  <150 mg/dLBorderline High:  150 - 199 mg/dL   HDL 56.00 >39.00 mg/dL   VLDL 15.6 0.0 - 40.0 mg/dL   LDL Cholesterol 93 0  - 99 mg/dL   Total CHOL/HDL Ratio 3     Comment:                Men          Women1/2 Average Risk     3.4          3.3Average Risk          5.0          4.42X Average Risk          9.6          7.13X Average Risk          15.0          11.0                       NonHDL 108.25     Comment: NOTE:  Non-HDL goal should be 30 mg/dL higher than patient's LDL goal (i.e. LDL goal of < 70 mg/dL, would have non-HDL goal of < 100 mg/dL)  Comprehensive metabolic panel     Status: None   Collection Time: 06/23/20  8:55 AM  Result Value Ref Range   Sodium 142 135 - 145 mEq/L   Potassium 3.9 3.5 - 5.1 mEq/L   Chloride 106 96 - 112 mEq/L   CO2 23 19 - 32 mEq/L   Glucose, Bld 93 70 - 99 mg/dL   BUN 14 6 - 23 mg/dL   Creatinine, Ser 0.77 0.40 - 1.20 mg/dL   Total Bilirubin 0.4 0.2 - 1.2 mg/dL   Alkaline Phosphatase 57 39 - 117 U/L   AST 15 0 - 37 U/L   ALT 14 0 - 35 U/L   Total Protein 6.3 6.0 - 8.3 g/dL   Albumin 4.3 3.5 - 5.2 g/dL   GFR 81.17 >60.00 mL/min    Comment: Calculated using the CKD-EPI Creatinine Equation (2021)   Calcium 9.5 8.4 - 10.5 mg/dL   Objective  Body mass index is 20.17 kg/m. Wt Readings from Last 3 Encounters:  05/31/20 117 lb (53.1 kg)  06/02/13 126 lb (57.2 kg)  02/05/13 126 lb (57.2 kg)   Temp Readings from Last 3 Encounters:  05/31/20 98.5 F (36.9 C) (Oral)  06/02/13 98.1 F (36.7 C) (Oral)  02/05/13 98.4 F (36.9 C) (Oral)   BP Readings from Last 3 Encounters:  05/31/20 106/78  06/02/13 100/68  02/05/13 116/70   Pulse Readings from Last 3 Encounters:  05/31/20 99  06/02/13 75  02/05/13 90    Physical Exam Vitals and nursing note reviewed.  Constitutional:      Appearance: Normal appearance. She is well-developed and well-groomed.  HENT:     Head: Normocephalic and atraumatic.  Eyes:     Conjunctiva/sclera: Conjunctivae normal.     Pupils: Pupils are equal, round, and reactive to light.  Cardiovascular:     Rate and Rhythm: Normal rate and  regular rhythm.  Heart sounds: Normal heart sounds. No murmur heard.   Pulmonary:     Effort: Pulmonary effort is normal.     Breath sounds: Normal breath sounds.  Abdominal:     General: Abdomen is flat. Bowel sounds are normal.  Skin:    General: Skin is warm and dry.  Neurological:     General: No focal deficit present.     Mental Status: She is alert and oriented to person, place, and time. Mental status is at baseline.     Gait: Gait normal.  Psychiatric:        Attention and Perception: Attention and perception normal.        Mood and Affect: Mood and affect normal.        Speech: Speech normal.        Behavior: Behavior normal. Behavior is cooperative.        Thought Content: Thought content normal.        Cognition and Memory: Cognition and memory normal.        Judgment: Judgment normal.     Assessment  Plan  CAD wo angina/HLD familial elevated lpa, AA - Plan: Ambulatory referral to Cardiology, Ambulatory referral to Cardiology [facility] [place] and Dr. Debara Pickett   Chronic obstructive pulmonary disease, stable former smoker h/o lung nodules and moderate persistent asthma - Plan: Ambulatory referral to Pulmonology [facility] in [place] Lung nodule - Plan: Ambulatory referral to Pulmonology  They will need to determine if she needs to continue theophylline 300 qam and 150 qpm  Cont symbicort, combivent prn   Thyroid nodule, hypothyroidism hashimotos thyroiditis, osteoporosis and hyercalciuria  - Plan: thyroid (ARMOUR) 15 MG tablet, Ambulatory referral to Endocrinology,  Thyroiditis - Plan: TSH, T4, free, Thyroid peroxidase antibody, T3, free, thyroid (ARMOUR) 45 mg M-F and Sat/sunday 30 mg qd   Vitamin D deficiency - Plan: Vitamin D (25 hydroxy) Results for Barbara, Lawson (MRN [id number]) as of 06/28/2020 18:59  Ref. Range 06/23/2020 08:55  VITD Latest Ref Range: 30.00 - 100.00 ng/mL 49.00    Polyp of colon,multiple - Plan: Ambulatory referral to Gastroenterology [facility] referred   [facility]    HPV in female - Plan: Ambulatory referral to Obstetrics / Gynecology [facility] due for pap Cyst of left ovary - Plan: Ambulatory referral to Obstetrics / Gynecology referred [facility] Herpes - Plan: Ambulatory referral to Obstetrics / Gynecology  Hypertension, controlled  On hctz 12.5 or 25 mg qd confirm dose   HM Flu shot not had  Tdap utd 10/20/19  shingrix 2/2  prevnar utd 06/23/20  pna 23 utd [place] 3/3 consider booster   Hep C negative 06/23/20 Pap 03/28/17 negative consider in future h/o abnormal pap  Mammogram 12/09/19 negative  --referred [place] when due 11/2020  dexa +osteoporsis dexa 08/24/19  -referred to [facility] endocrine   Colonoscopy h/o 20+ polyps as of 05/31/20 3-4 years ago  -referred [facility]   H/o thyroid nodule 5.2 x 1.8 x 1.9 cm right lobe thyroid  H/o osteoporosis T score -2.9, -2.2, -2.3  H/o lung nodules Stress echo had 06/26/16   Results  Global LVEF (rest): Normal (LVEF >50%)  Global LVEF (stress): Hyperkinetic (LVEF >70%)  ECG  Normal sinus rhythm. Normal tracing  Arrhythmias  No rhythm abnormality.  Symptoms  No cardiovascular symptoms with maximal exercise.   TVUS/US 08/08/16 2.5 cm simple cyst left ovary    CT 09/25/2018 CT lung screen  IMPRESSION:  1. Lung-RADS 2, benign appearance or behavior. Continue annual  screening with  low-dose chest CT without contrast in 12 months.  2. Emphysema. (ICD10-J43.9)  3. Aortic Atherosclerois (ICD10-170.0)    Electronically Signed  By: Misty Stanley M.D.  On: 09/26/2018 08:23 CT chest 01/07/20  IMPRESSION:  1. 1. Lung-RADS 2S, benign appearance or behavior. Continue annual  screening with low-dose chest CT without contrast in 12 months.  2. The "S" modifier above refers to potentially clinically  significant non lung cancer related findings. Specifically, there is  aortic atherosclerosis, in addition to 2 vessel coronary artery  disease. Please note that although the presence of coronary  artery  calcium documents the presence of coronary artery disease, the  severity of this disease and any potential stenosis cannot be  assessed on this non-gated CT examination. Assessment for potential  risk factor modification, dietary therapy or pharmacologic therapy  may be warranted, if clinically indicated.  3. Mild diffuse bronchial wall thickening with mild centrilobular  and paraseptal emphysema; imaging findings suggestive of underlying  COPD.   Aortic Atherosclerosis (ICD10-I70.0) and Emphysema (ICD10-J43.9).    Electronically Signed  By: Vinnie Langton M.D.  On: 01/08/2020 10:15   Xray right ankle 11/28/2018  1. Age-indeterminate avulsion fracture at the tip of the lateral malleolus.  2. Mild soft tissue swelling overlying the lateral malleolus.  3. Joint spaces are maintained.  4. Calcaneal enthesopathy  As of 05/2020 Referred  [facility] pulm in [place]  [facility] cards in [place] and [place] for HLD Dr. Jayme Cloud Endocrine  Mammogram [place] [facility] ob[facility]gyn  GI [place] in [facility]    Specialists  Eye exam as of 05/31/20 3 years ago  Cardiology Dr. Ella Jubilee Dr. Elijio Miles  Endocrine Dr. Marva Panda seen 05/28/19  Integrative doctor Dr. Cherre Blanc  Dr. Tacy Dura GYN [facility]  [facility] Dr. Dennie Bible prior [facility]  Dermatology Dr. Orlene Och [facility] sch 11/28/20  Or[facility]o [facility], Dr. Birdena Jubilee [facility] sch 07/06/20  [facility] pulm Dr. Elijio Miles  Chiropractor  [facility] ob/gyn Dr. Durene Fruits [facility] 08/31/16 voiding dysfunction Dr. Ihor Dow Neurology [facility] Dr. Candise Bowens 2018 [facility] [facility] cardiology Dr. Toya Smothers     Time spent >50% time 60 minutes review of patient prior history and notes and imaging care everywhere, addressed health maintenance, face to face with patient 30 minutes, 30 minutes review of entire medical chart labs, prior imaging, notes, and Rx medications with referrals to new specialists in the  area   Provider: Dr. Olivia Mackie McLean-[facility]oc[facility]-Internal Medicine

## 2020-05-31 NOTE — Telephone Encounter (Signed)
Did pt leave her new patient paperwork which was not complete I do not have this call and I need this please ?

## 2020-05-31 NOTE — Patient Instructions (Addendum)
Consider prevnar vaccine here nurse visit  [place] or [place] for chiropractor if interested  Figure out who in [place] you saw for colonoscopy please and we need to get records asap  replens over the counter  Consider GYN for pap smear   [facility]   Cardiology Dr. Debara Pickett in [place] and regular cardiologist   [facility]ocrine [facility]    Consider GYN [facility], [facility], [place] [facility], [facility]   [facility]  [place] [address] [phone]   [facility] [place]  [address]  [place] [address]  [phone]   Pneumococcal Conjugate Vaccine (Prevnar 13) Suspension for Injection What is this medicine? PNEUMOCOCCAL VACCINE (NEU mo KOK al vak SEEN) is a vaccine used to prevent pneumococcus bacterial infections. These bacteria can cause serious infections like pneumonia, meningitis, and blood infections. This vaccine will lower your chance of getting pneumonia. If you do get pneumonia, it can make your symptoms milder and your illness shorter. This vaccine will not treat an infection and will not cause infection. This vaccine is recommended for infants and young children, adults with certain medical conditions, and adults 25 years or older. This medicine may be used for other purposes; ask your health care provider or pharmacist if you have questions. COMMON BRAND NAME(S): Prevnar, Prevnar 13 What should I tell my health care provider before I take this medicine? They need to know if you have any of these conditions:  bleeding problems  fever  immune system problems  an unusual or allergic reaction to pneumococcal vaccine, diphtheria toxoid, other vaccines, latex, other medicines, foods, dyes, or preservatives  pregnant or trying to get pregnant  breast-feeding How should I use this  medicine? This vaccine is for injection into a muscle. It is given by a health care professional. A copy of Vaccine Information Statements will be given before each vaccination. Read this sheet carefully each time. The sheet may change frequently. Talk to your pediatrician regarding the use of this medicine in children. While this drug may be prescribed for children as young as 56 weeks old for selected conditions, precautions do apply. Overdosage: If you think you have taken too much of this medicine contact a poison control center or emergency room at once. NOTE: This medicine is only for you. Do not share this medicine with others. What if I miss a dose? It is important not to miss your dose. Call your doctor or health care professional if you are unable to keep an appointment. What may interact with this medicine?  medicines for cancer chemotherapy  medicines that suppress your immune function  steroid medicines like prednisone or cortisone This list may not describe all possible interactions. Give your health care provider a list of all the medicines, herbs, non-prescription drugs, or dietary supplements you use. Also tell them if you smoke, drink alcohol, or use illegal drugs. Some items may interact with your medicine. What should I watch for while using this medicine? Mild fever and pain should go away in 3 days or less. Report any unusual symptoms to your doctor or health care professional. What side effects may I notice from receiving this medicine? Side effects that you should report to your doctor or health care professional as soon as possible:  allergic reactions like skin rash, itching or hives, swelling of the face, lips, or tongue  breathing problems  confused  fast or irregular heartbeat  fever over 102 degrees F  seizures  unusual bleeding or bruising  unusual muscle weakness Side effects that usually do not require medical attention (report to your doctor or  health care professional if they continue or are bothersome):  aches and pains  diarrhea  fever of 102 degrees F or less  headache  irritable  loss of appetite  pain, tender at site where injected  trouble sleeping This list may not describe all possible side effects. Call your doctor for medical advice about side effects. You may report side effects to FDA at 1-800-FDA-1088. Where should I keep my medicine? This does not apply. This vaccine is given in a clinic, pharmacy, doctor's office, or other health care setting and will not be stored at home. NOTE: This sheet is a summary. It may not cover all possible information. If you have questions about this medicine, talk to your doctor, pharmacist, or health care provider.  2021 Elsevier/Gold Standard (2013-10-15 10:27:27)  High Cholesterol  High cholesterol is a condition in which the blood has high levels of a white, waxy substance similar to fat (cholesterol). The liver makes all the cholesterol that the body needs. The human body needs small amounts of cholesterol to help build cells. A person gets extra or excess cholesterol from the food that he or she eats. The blood carries cholesterol from the liver to the rest of the body. If you have high cholesterol, deposits (plaques) may build up on the walls of your arteries. Arteries are the blood vessels that carry blood away from your heart. These plaques make the arteries narrow and stiff. Cholesterol plaques increase your risk for heart attack and stroke. Work with your health care provider to keep your cholesterol levels in a healthy range. What increases the risk? The following factors may make you more likely to develop this condition:  Eating foods that are high in animal fat (saturated fat) or cholesterol.  Being overweight.  Not getting enough exercise.  A family history of high cholesterol (familial hypercholesterolemia).  Use of tobacco products.  Having  diabetes. What are the signs or symptoms? There are no symptoms of this condition. How is this diagnosed? This condition may be diagnosed based on the results of a blood test.  If you are older than 65 years of age, your health care provider may check your cholesterol levels every 4-6 years.  You may be checked more often if you have high cholesterol or other risk factors for heart disease. The blood test for cholesterol measures:  "Bad" cholesterol, or LDL cholesterol. This is the main type of cholesterol that causes heart disease. The desired level is less than 100 mg/dL.  "Good" cholesterol, or HDL cholesterol. HDL helps protect against heart disease by cleaning the arteries and carrying the LDL to the liver for processing. The desired level for HDL is 60 mg/dL or higher.  Triglycerides. These are fats that your body can store or burn for energy. The desired level is less than 150 mg/dL.  Total cholesterol. This measures the total amount of cholesterol in your blood and includes LDL, HDL, and triglycerides. The desired level is less than 200 mg/dL. How is this treated? This condition may be treated with:  Diet changes. You may be asked to eat foods that have more fiber and less saturated fats or added sugar.  Lifestyle changes. These may include regular exercise, maintaining a healthy weight, and quitting use of tobacco products.  Medicines. These  are given when diet and lifestyle changes have not worked. You may be prescribed a statin medicine to help lower your cholesterol levels. Follow these instructions at home: Eating and drinking  Eat a healthy, balanced diet. This diet includes: ? Daily servings of a variety of fresh, frozen, or canned fruits and vegetables. ? Daily servings of whole grain foods that are rich in fiber. ? Foods that are low in saturated fats and trans fats. These include poultry and fish without skin, lean cuts of meat, and low-fat dairy products. ? A  variety of fish, especially oily fish that contain omega-3 fatty acids. Aim to eat fish at least 2 times a week.  Avoid foods and drinks that have added sugar.  Use healthy cooking methods, such as roasting, grilling, broiling, baking, poaching, steaming, and stir-frying. Do not fry your food except for stir-frying.   Lifestyle  Get regular exercise. Aim to exercise for a total of 150 minutes a week. Increase your activity level by doing activities such as gardening, walking, and taking the stairs.  Do not use any products that contain nicotine or tobacco, such as cigarettes, e-cigarettes, and chewing tobacco. If you need help quitting, ask your health care provider.   General instructions  Take over-the-counter and prescription medicines only as told by your health care provider.  Keep all follow-up visits as told by your health care provider. This is important. Where to find more information  American Heart Association: www.heart.org  National Heart, Lung, and Blood Institute: https://wilson-eaton.com/ Contact a health care provider if:  You have trouble achieving or maintaining a healthy diet or weight.  You are starting an exercise program.  You are unable to stop smoking. Get help right away if:  You have chest pain.  You have trouble breathing.  You have any symptoms of a stroke. "BE FAST" is an easy way to remember the main warning signs of a stroke: ? B - Balance. Signs are dizziness, sudden trouble walking, or loss of balance. ? E - Eyes. Signs are trouble seeing or a sudden change in vision. ? F - Face. Signs are sudden weakness or numbness of the face, or the face or eyelid drooping on one side. ? A - Arms. Signs are weakness or numbness in an arm. This happens suddenly and usually on one side of the body. ? S - Speech. Signs are sudden trouble speaking, slurred speech, or trouble understanding what people say. ? T - Time. Time to call emergency services. Write down what  time symptoms started.  You have other signs of a stroke, such as: ? A sudden, severe headache with no known cause. ? Nausea or vomiting. ? Seizure. These symptoms may represent a serious problem that is an emergency. Do not wait to see if the symptoms will go away. Get medical help right away. Call your local emergency services (911 in the [place] Do not drive yourself to the hospital. Summary  Cholesterol plaques increase your risk for heart attack and stroke. Work with your health care provider to keep your cholesterol levels in a healthy range.  Eat a healthy, balanced diet, get regular exercise, and maintain a healthy weight.  Do not use any products that contain nicotine or tobacco, such as cigarettes, e-cigarettes, and chewing tobacco.  Get help right away if you have any symptoms of a stroke. This information is not intended to replace advice given to you by your health care provider. Make sure you discuss any questions you have  with your health care provider. Document Revised: 12/08/2018 Document Reviewed: 12/08/2018 Elsevier Patient Education  2021 [place].  Cholesterol Content in Foods Cholesterol is a waxy, fat-like substance that helps to carry fat in the blood. The body needs cholesterol in small amounts, but too much cholesterol can cause damage to the arteries and heart. Most people should eat less than 200 milligrams (mg) of cholesterol a day. Foods with cholesterol Cholesterol is found in animal-based foods, such as meat, seafood, and dairy. Generally, low-fat dairy and lean meats have less cholesterol than full-fat dairy and fatty meats. The milligrams of cholesterol per serving (mg per serving) of common cholesterol-containing foods are listed below. Meat and other proteins  Egg -- one large whole egg has 186 mg.  Veal shank -- 4 oz has 141 mg.  Lean ground [place] (93% lean) -- 4 oz has 118 mg.  Fat-trimmed lamb loin -- 4 oz has 106 mg.  Lean ground beef  (90% lean) -- 4 oz has 100 mg.  Lobster -- 3.5 oz has 90 mg.  Pork loin chops -- 4 oz has 86 mg.  Canned salmon -- 3.5 oz has 83 mg.  Fat-trimmed beef top loin -- 4 oz has 78 mg.  Frankfurter -- 1 frank (3.5 oz) has 77 mg.  Crab -- 3.5 oz has 71 mg.  Roasted chicken without skin, white meat -- 4 oz has 66 mg.  Light bologna -- 2 oz has 45 mg.  Deli-cut [place] -- 2 oz has 31 mg.  Canned tuna -- 3.5 oz has 31 mg.  Berniece Salines -- 1 oz has 29 mg.  Oysters and mussels (raw) -- 3.5 oz has 25 mg.  Mackerel -- 1 oz has 22 mg.  Trout -- 1 oz has 20 mg.  Pork sausage -- 1 link (1 oz) has 17 mg.  Salmon -- 1 oz has 16 mg.  Tilapia -- 1 oz has 14 mg. Dairy  Soft-serve ice cream --  cup (4 oz) has 103 mg.  Whole-milk yogurt -- 1 cup (8 oz) has 29 mg.  Cheddar cheese -- 1 oz has 28 mg.  American cheese -- 1 oz has 28 mg.  Whole milk -- 1 cup (8 oz) has 23 mg.  2% milk -- 1 cup (8 oz) has 18 mg.  Cream cheese -- 1 tablespoon (Tbsp) has 15 mg.  Cottage cheese --  cup (4 oz) has 14 mg.  Low-fat (1%) milk -- 1 cup (8 oz) has 10 mg.  Sour cream -- 1 Tbsp has 8.5 mg.  Low-fat yogurt -- 1 cup (8 oz) has 8 mg.  Nonfat Greek yogurt -- 1 cup (8 oz) has 7 mg.  Half-and-half cream -- 1 Tbsp has 5 mg. Fats and oils  Cod liver oil -- 1 tablespoon (Tbsp) has 82 mg.  Butter -- 1 Tbsp has 15 mg.  Lard -- 1 Tbsp has 14 mg.  Bacon grease -- 1 Tbsp has 14 mg.  Mayonnaise -- 1 Tbsp has 5-10 mg.  Margarine -- 1 Tbsp has 3-10 mg. Exact amounts of cholesterol in these foods may vary depending on specific ingredients and brands.   Foods without cholesterol Most plant-based foods do not have cholesterol unless you combine them with a food that has cholesterol. Foods without cholesterol include:  Grains and cereals.  Vegetables.  Fruits.  Vegetable oils, such as olive, canola, and sunflower oil.  Legumes, such as peas, beans, and lentils.  Nuts and seeds.  Egg whites.  Summary  The body needs cholesterol in small amounts, but too much cholesterol can cause damage to the arteries and heart.  Most people should eat less than 200 milligrams (mg) of cholesterol a day. This information is not intended to replace advice given to you by your health care provider. Make sure you discuss any questions you have with your health care provider. Document Revised: 06/01/2019 Document Reviewed: 06/01/2019 Elsevier Patient Education  [place] injection What is this medicine? ALIROCUMAB (al i ROC ue mab) is known as a PCSK9 inhibitor. It is used to lower the level of cholesterol in the blood. It may be used alone or in combination with other cholesterol-lowering drugs. This drug may also be used to reduce the risk of heart attack, stroke, and certain types of chest pain (unstable angina) that may need hospitalization. This medicine may be used for other purposes; ask your health care provider or pharmacist if you have questions. COMMON BRAND NAME(S): Praluent What should I tell my health care provider before I take this medicine? They need to know if you have any of these conditions:  any unusual or allergic reaction to alirocumab, other medicines, foods, dyes, or preservatives  pregnant or trying to get pregnant  breast-feeding How should I use this medicine? This medicine is for injection under the skin. You will be taught how to prepare and give this medicine. Use exactly as directed. Take your medicine at regular intervals. Do not take your medicine more often than directed. It is important that you put your used needles and syringes in a special sharps container. Do not put them in a trash can. If you do not have a sharps container, call your pharmacist or healthcare provider to get one. Talk to your pediatrician regarding the use of this medicine in children. Special care may be needed. Overdosage: If you think you have taken too much of this  medicine contact a poison control center or emergency room at once. NOTE: This medicine is only for you. Do not share this medicine with others. What if I miss a dose? If you are on an every 2 week schedule and miss a dose, take it as soon as you can. If your next dose is to be taken in less than 7 days, then do not take the missed dose. Take the next dose at your regular time. Do not take double or extra doses. If you are on a monthly schedule and miss a dose, take it as soon as you can. If the dose given is within 7 days of the missed dose, continue with your regular monthly schedule. If the missed dose is administered after 7 days of the original date, administer the dose and start a new monthly schedule based on this date. What may interact with this medicine? Interactions are not expected. This list may not describe all possible interactions. Give your health care provider a list of all the medicines, herbs, non-prescription drugs, or dietary supplements you use. Also tell them if you smoke, drink alcohol, or use illegal drugs. Some items may interact with your medicine. What should I watch for while using this medicine? You may need blood work done while you are taking this medicine. What side effects may I notice from receiving this medicine? Side effects that you should report to your doctor or health care professional as soon as possible:  allergic reactions like skin rash, itching or hives, swelling of the face, lips, or tongue  signs  and symptoms of infection like fever or chills; cough; sore throat; pain or trouble passing urine  signs and symptoms of liver injury like dark yellow or brown urine; general ill feeling or flu-like symptoms; light-colored stools; loss of appetite; nausea; right upper belly pain; unusually weak or tired; yellowing of the eyes or skin Side effects that usually do not require medical attention (report to your doctor or health care professional if they continue  or are bothersome):  diarrhea  muscle cramps  muscle pain  pain, redness, or irritation at site where injected This list may not describe all possible side effects. Call your doctor for medical advice about side effects. You may report side effects to FDA at 1-800-FDA-1088. Where should I keep my medicine? Keep out of the reach of children. You will be instructed on how to store this medicine. Throw away any unused medicine after the expiration date on the label. NOTE: This sheet is a summary. It may not cover all possible information. If you have questions about this medicine, talk to your doctor, pharmacist, or health care provider.  2021 Elsevier/Gold Standard (2017-05-23 22:02:52)  Evolocumab injection What is this medicine? EVOLOCUMAB (e voe LOK ue mab) treats high cholesterol. It is used with lifestyle changes, like diet and exercise. It is used alone or with other medicines. This medicine may be used for other purposes; ask your health care provider or pharmacist if you have questions. COMMON BRAND NAME(S): Repatha What should I tell my health care provider before I take this medicine? They need to know if you have any of these conditions:  an unusual or allergic reaction to evolocumab, other medicines, latex, foods, dyes, or preservatives  pregnant or trying to get pregnant  breast-feeding How should I use this medicine? This medicine is injected under the skin. You will be taught how to prepare and give it. Take it as directed on the prescription label at the same time every day. Keep taking it unless your health care provider tells you to stop. It is important that you put your used needles and syringes in a special sharps container. Do not put them in a trash can. If you do not have a sharps container, call your pharmacist or health care provider to get one. This medicine comes with INSTRUCTIONS FOR USE. Ask your pharmacist for directions on how to use this medicine. Read  the information carefully. Talk to your pharmacist or health care provider if you have questions. Talk to your health care provider about the use of this medicine in children. While it may be prescribed for children as young as 10 for selected conditions, precautions do apply. Overdosage: If you think you have taken too much of this medicine contact a poison control center or emergency room at once. NOTE: This medicine is only for you. Do not share this medicine with others. What if I miss a dose? It is important not to miss any doses. Talk to your health care provider about what to do if you miss a dose. What may interact with this medicine? Interactions are not expected. This list may not describe all possible interactions. Give your health care provider a list of all the medicines, herbs, non-prescription drugs, or dietary supplements you use. Also tell them if you smoke, drink alcohol, or use illegal drugs. Some items may interact with your medicine. What should I watch for while using this medicine? Visit your health care provider for regular checks on your progress. Tell your health care  provider if your symptoms do not start to get better or if they get worse. You may need blood work while you are taking this drug. Do not wear the on-body infuser during an MRI. Taking this drug is only part of a total heart healthy program. Your health care provider may give you a special diet to follow. Avoid alcohol. Avoid smoking. Ask your health care provider how much you should exercise. What side effects may I notice from receiving this medicine? Side effects that you should report to your doctor or health care provider as soon as possible:  allergic reactions (skin rash, itching or hives; swelling of the face, lips, or tongue)  high blood sugar (increased hunger, thirst, or urination; unusually weak or tired, blurry vision)  infection (fever, chills, cough, sore throat, pain, or trouble passing  urine) Side effects that usually do not require medical attention (report to your doctor or health care provider if they continue or are bothersome):  diarrhea  muscle pain  nausea  pain, redness, or irritation at site where injected This list may not describe all possible side effects. Call your doctor for medical advice about side effects. You may report side effects to FDA at 1-800-FDA-1088. Where should I keep my medicine? Keep out of the reach of children and pets. Store in a refrigerator or at room temperature between 20 and 25 degrees C (68 and 77 degrees F). Refrigeration (preferred): Store it in the refrigerator. Do not freeze. Keep it in the original carton until you are ready to take it. Remove the dose from the carton about 30 minutes before it is time for you to take it. Throw away any unused medicine after the expiration date. Room temperature: This medicine may be stored at room temperature for up to 30 days. Keep it in the original carton until you are ready to take it. If it is stored at room temperature, throw away any unused medicine after 30 days or after it expires, whichever is first. Protect from light. Do not shake. Avoid exposure to extreme heat. To get rid of medicines that are no longer needed or have expired:  Take the medicine to a medicine take-back program. Check with your pharmacy or law enforcement to find a location.  If you cannot return the medicine, ask your pharmacist or health care provider how to get rid of this medicine safely. NOTE: This sheet is a summary. It may not cover all possible information. If you have questions about this medicine, talk to your doctor, pharmacist, or health care provider.  2021 Elsevier/Gold Standard (2018-12-31 12:26:14)  Coronary Artery Disease, Female Coronary artery disease (CAD) is a condition in which the arteries that lead to the heart (coronary arteries) become narrow or blocked. The narrowing or blockage can lead  to decreased blood flow to the heart. Prolonged reduced blood flow can cause a heart attack (myocardial infarction or MI). This condition may also be called coronary heart disease. Because CAD is the leading cause of death in women, it is important to understand what causes this condition and how it is treated. What are the causes? CAD is most often caused by atherosclerosis. This is the buildup of fat and cholesterol (plaque) on the inside of the arteries. Over time, the plaque may narrow or block the artery, reducing blood flow to the heart. Plaque can also become weak and break off within a coronary artery and cause a sudden blockage. Other less common causes of CAD include:  A blood  clot or a piece of a blood clot or other substance that blocks the flow of blood in a coronary artery (embolism).  A tearing of the artery (spontaneous coronary artery dissection).  An enlargement of an artery (aneurysm).  Inflammation (vasculitis) in the artery wall.   What increases the risk? The following factors may make you more likely to develop this condition:  Age. Women over age 22 are at a greater risk of CAD.  Family history of CAD.  High blood pressure (hypertension).  Diabetes.  High cholesterol levels.  Tobacco use.  Lack of exercise.  Menopause. ? All postmenopausal women are at greater risk of CAD. ? Women who have experienced menopause between the ages of 66-45 (early menopause) are at a higher risk of CAD. ? Women who have experienced menopause before age 94 (premature menopause) are at a very high risk of CAD.  Excessive alcohol use.  A diet high in saturated and trans fats, such as fried food and processed meat. Other possible risk factors include:  High stress levels.  Depression.  Obesity.  Sleep apnea. What are the signs or symptoms? Many people do not have any symptoms during the early stages of CAD. As the condition progresses, symptoms may include:  Chest pain  (angina). The pain can: ? Feel like crushing or squeezing, or like a tightness, pressure, fullness, or heaviness in the chest. ? Last more than a few minutes or can stop and recur. The pain tends to get worse with exercise or stress and to fade with rest.  Pain in the arms, neck, jaw, ear, or back.  Unexplained heartburn or indigestion.  Shortness of breath.  Nausea.  Sudden cold sweats.  Sudden light-headedness.  Fluttering or fast heartbeat (palpitations). Many women have chest discomfort and the other symptoms. However, women often have unusual (atypical) symptoms, such as:  Fatigue.  Vomiting.  Unexplained feelings of nervousness or anxiety.  Unexplained weakness.  Dizziness or fainting. How is this diagnosed? This condition is diagnosed based on:  Your family and medical history.  A physical exam.  Tests, including: ? A test to check the electrical signals in your heart (electrocardiogram). ? Exercise stress test. This looks for signs of blockage when the heart is stressed with exercise, such as running on a treadmill. ? Pharmacologic stress test. This test looks for signs of blockage when the heart is being stressed with a medicine. ? Blood tests. ? Coronary angiogram. This is a procedure to look at the coronary arteries to see if there is any blockage. During this test, a dye is injected into your arteries so they appear on an X-ray. ? Coronary artery CT scan. This CT scan helps detect calcium deposits in your coronary arteries. Calcium deposits are an indicator of CAD. ? A test that uses sound waves to take a picture of your heart (echocardiogram). ? Chest X-ray. How is this treated? This condition may be treated by:  Healthy lifestyle changes to reduce risk factors.  Medicines such as: ? Antiplatelet medicines and blood-thinning medicines, such as aspirin. These help to prevent blood clots. ? Nitroglycerin. ? Blood pressure  medicines. ? Cholesterol-lowering medicine.  Coronary angioplasty and stenting. During this procedure, a thin, flexible tube is inserted through a blood vessel and into a blocked artery. A balloon or similar device on the end of the tube is inflated to open up the artery. In some cases, a small, mesh tube (stent) is inserted into the artery to keep it open.  Coronary  artery bypass surgery. During this surgery, veins or arteries from other parts of the body are used to create a bypass around the blockage and allow blood to reach your heart. Follow these instructions at home: Medicines  Take over-the-counter and prescription medicines only as told by your health care provider.  Do not take the following medicines unless your health care provider approves: ? NSAIDs, such as ibuprofen, naproxen, or celecoxib. ? Vitamin supplements that contain vitamin A, vitamin E, or both. ? Hormone replacement therapy that contains estrogen with or without progestin. Lifestyle  Follow an exercise program approved by your health care provider. Aim for 150 minutes of moderate exercise or 75 minutes of vigorous exercise each week.  Maintain a healthy weight or lose weight as approved by your health care provider.  Learn to manage stress or try to limit your stress. Ask your health care provider for suggestions if you need help.  Get screened for depression and seek treatment, if needed.  Do not use any products that contain nicotine or tobacco, such as cigarettes, e-cigarettes, and chewing tobacco. If you need help quitting, ask your health care provider.  Do not use illegal drugs. Eating and drinking  Follow a heart-healthy diet. A dietitian can help educate you about healthy food options and changes. In general, eat plenty of fruits and vegetables, lean meats, and whole grains.  Avoid foods high in: ? Sugar. ? Salt (sodium). ? Saturated fats, such as processed or fatty meat. ? Trans fats, such as  fried food.  Use healthy cooking methods such as roasting, grilling, broiling, baking, poaching, steaming, or stir-frying.  Do not drink alcohol if: ? Your health care provider tells you not to drink. ? You are pregnant, may be pregnant, or are planning to become pregnant.  If you drink alcohol: ? Limit how much you have to 0-1 drink a day. ? Be aware of how much alcohol is in your drink. In the [place] one drink equals one 12 oz bottle of beer (355 mL), one 5 oz glass of wine (148 mL), or one 1 oz glass of hard liquor (44 mL).   General instructions  Manage any other health conditions, such as hypertension and diabetes. These conditions affect your heart.  Your health care provider may ask you to monitor your blood pressure. Ideally, your blood pressure should be below 130/80.  Keep all follow-up visits as told by your health care provider. This is important. Get help right away if:  You have pain in your chest, neck, ear, arm, jaw, stomach, or back that: ? Lasts more than a few minutes. ? Is recurring. ? Is not relieved by taking medicine under your tongue (sublingual nitroglycerin).  You have profuse sweating without cause.  You have unexplained: ? Heartburn or indigestion. ? Shortness of breath or difficulty breathing. ? Fluttering or fast heartbeat (palpitations). ? Nausea or vomiting. ? Fatigue. ? Feelings of nervousness or anxiety. ? Weakness. ? Diarrhea.  You have sudden light-headedness or dizziness.  You faint.  You feel like hurting yourself or think about taking your own life. These symptoms may represent a serious problem that is an emergency. Do not wait to see if the symptoms will go away. Get medical help right away. Call your local emergency services (911 in the [place] Do not drive yourself to the hospital. Summary  Coronary artery disease (CAD) is a condition in which the arteries that lead to the heart (coronary arteries) become narrow or blocked. The  narrowing  or blockage can lead to a heart attack.  Many women have chest discomfort and other common symptoms of CAD. However, women often have unusual (atypical) symptoms, such as fatigue, vomiting, weakness, or dizziness.  CAD can be treated with lifestyle changes, medicines, surgery, or a combination of these treatments. This information is not intended to replace advice given to you by your health care provider. Make sure you discuss any questions you have with your health care provider. Document Revised: 09/27/2017 Document Reviewed: 09/17/2017 Elsevier Patient Education  2021 [employer].

## 2020-06-01 NOTE — Telephone Encounter (Signed)
Yes was on my desk. Given back to Dr Olivia Mackie McLean-Scocuzza this morning

## 2020-06-23 ENCOUNTER — Ambulatory Visit (INDEPENDENT_AMBULATORY_CARE_PROVIDER_SITE_OTHER): Payer: Medicare Other

## 2020-06-23 ENCOUNTER — Other Ambulatory Visit: Payer: Self-pay

## 2020-06-23 ENCOUNTER — Encounter (INDEPENDENT_AMBULATORY_CARE_PROVIDER_SITE_OTHER): Payer: Self-pay

## 2020-06-23 DIAGNOSIS — E069 Thyroiditis, unspecified: Secondary | ICD-10-CM

## 2020-06-23 DIAGNOSIS — E785 Hyperlipidemia, unspecified: Secondary | ICD-10-CM

## 2020-06-23 DIAGNOSIS — Z0000 Encounter for general adult medical examination without abnormal findings: Secondary | ICD-10-CM

## 2020-06-23 DIAGNOSIS — Z23 Encounter for immunization: Secondary | ICD-10-CM | POA: Diagnosis not present

## 2020-06-23 DIAGNOSIS — Z13818 Encounter for screening for other digestive system disorders: Secondary | ICD-10-CM | POA: Diagnosis not present

## 2020-06-23 DIAGNOSIS — E7841 Elevated Lipoprotein(a): Secondary | ICD-10-CM

## 2020-06-23 DIAGNOSIS — E559 Vitamin D deficiency, unspecified: Secondary | ICD-10-CM | POA: Diagnosis not present

## 2020-06-23 DIAGNOSIS — Z1389 Encounter for screening for other disorder: Secondary | ICD-10-CM

## 2020-06-23 LAB — CBC WITH DIFFERENTIAL/PLATELET
Basophils Absolute: 0.1 10*3/uL (ref 0.0–0.1)
Basophils Relative: 1.1 % (ref 0.0–3.0)
Eosinophils Absolute: 0.2 10*3/uL (ref 0.0–0.7)
Eosinophils Relative: 2.9 % (ref 0.0–5.0)
HCT: 40.1 % (ref 36.0–46.0)
Hemoglobin: 13.4 g/dL (ref 12.0–15.0)
Lymphocytes Relative: 23.1 % (ref 12.0–46.0)
Lymphs Abs: 1.9 10*3/uL (ref 0.7–4.0)
MCHC: 33.5 g/dL (ref 30.0–36.0)
MCV: 89.8 fl (ref 78.0–100.0)
Monocytes Absolute: 0.6 10*3/uL (ref 0.1–1.0)
Monocytes Relative: 7.3 % (ref 3.0–12.0)
Neutro Abs: 5.4 10*3/uL (ref 1.4–7.7)
Neutrophils Relative %: 65.6 % (ref 43.0–77.0)
Platelets: 370 10*3/uL (ref 150.0–400.0)
RBC: 4.46 Mil/uL (ref 3.87–5.11)
RDW: 13.8 % (ref 11.5–15.5)
WBC: 8.3 10*3/uL (ref 4.0–10.5)

## 2020-06-23 LAB — VITAMIN D 25 HYDROXY (VIT D DEFICIENCY, FRACTURES): VITD: 49 ng/mL (ref 30.00–100.00)

## 2020-06-23 LAB — TSH: TSH: 4.9 u[IU]/mL — ABNORMAL HIGH (ref 0.35–4.50)

## 2020-06-23 LAB — T3, FREE: T3, Free: 3.9 pg/mL (ref 2.3–4.2)

## 2020-06-23 LAB — T4, FREE: Free T4: 0.84 ng/dL (ref 0.60–1.60)

## 2020-06-23 NOTE — Progress Notes (Signed)
Patient presented for prevnar 13 injection to left deltoid, patient voiced no concerns nor showed any signs of distress during injection.

## 2020-06-24 ENCOUNTER — Encounter: Payer: Self-pay | Admitting: Internal Medicine

## 2020-06-24 DIAGNOSIS — E785 Hyperlipidemia, unspecified: Secondary | ICD-10-CM | POA: Insufficient documentation

## 2020-06-24 DIAGNOSIS — E7849 Other hyperlipidemia: Secondary | ICD-10-CM | POA: Insufficient documentation

## 2020-06-24 LAB — COMPREHENSIVE METABOLIC PANEL
ALT: 14 U/L (ref 0–35)
AST: 15 U/L (ref 0–37)
Albumin: 4.3 g/dL (ref 3.5–5.2)
Alkaline Phosphatase: 57 U/L (ref 39–117)
BUN: 14 mg/dL (ref 6–23)
CO2: 23 mEq/L (ref 19–32)
Calcium: 9.5 mg/dL (ref 8.4–10.5)
Chloride: 106 mEq/L (ref 96–112)
Creatinine, Ser: 0.77 mg/dL (ref 0.40–1.20)
GFR: 81.17 mL/min (ref >60.00)
Glucose, Bld: 93 mg/dL (ref 70–99)
Potassium: 3.9 mEq/L (ref 3.5–5.1)
Sodium: 142 mEq/L (ref 135–145)
Total Bilirubin: 0.4 mg/dL (ref 0.2–1.2)
Total Protein: 6.3 g/dL (ref 6.0–8.3)

## 2020-06-24 LAB — URINALYSIS, ROUTINE W REFLEX MICROSCOPIC
Bilirubin, UA: NEGATIVE
Glucose, UA: NEGATIVE
Ketones, UA: NEGATIVE
Leukocytes,UA: NEGATIVE
Nitrite, UA: NEGATIVE
Protein,UA: NEGATIVE
RBC, UA: NEGATIVE
Specific Gravity, UA: 1.007 (ref 1.005–1.030)
Urobilinogen, Ur: 0.2 mg/dL (ref 0.2–1.0)
pH, UA: 7 (ref 5.0–7.5)

## 2020-06-24 LAB — LIPID PANEL
Cholesterol: 164 mg/dL (ref 0–200)
HDL: 56 mg/dL (ref >39.00)
LDL Cholesterol: 93 mg/dL (ref 0–99)
NonHDL: 108.25
Total CHOL/HDL Ratio: 3
Triglycerides: 78 mg/dL (ref 0.0–149.0)
VLDL: 15.6 mg/dL (ref 0.0–40.0)

## 2020-06-24 LAB — HEPATITIS C ANTIBODY
Hepatitis C Ab: NONREACTIVE
SIGNAL TO CUT-OFF: 0 (ref <1.00)

## 2020-06-24 LAB — THYROID PEROXIDASE ANTIBODY: Thyroperoxidase Ab SerPl-aCnc: 183 IU/mL — ABNORMAL HIGH (ref 0–34)

## 2020-06-28 ENCOUNTER — Encounter: Payer: Self-pay | Admitting: Internal Medicine

## 2020-06-28 DIAGNOSIS — R82994 Hypercalciuria: Secondary | ICD-10-CM | POA: Insufficient documentation

## 2020-06-28 DIAGNOSIS — I700 Atherosclerosis of aorta: Secondary | ICD-10-CM | POA: Insufficient documentation

## 2020-06-28 DIAGNOSIS — Z87891 Personal history of nicotine dependence: Secondary | ICD-10-CM | POA: Insufficient documentation

## 2020-06-28 DIAGNOSIS — J449 Chronic obstructive pulmonary disease, unspecified: Secondary | ICD-10-CM | POA: Insufficient documentation

## 2020-06-28 DIAGNOSIS — E069 Thyroiditis, unspecified: Secondary | ICD-10-CM | POA: Insufficient documentation

## 2020-06-28 DIAGNOSIS — R918 Other nonspecific abnormal finding of lung field: Secondary | ICD-10-CM | POA: Insufficient documentation

## 2020-06-28 DIAGNOSIS — J189 Pneumonia, unspecified organism: Secondary | ICD-10-CM | POA: Insufficient documentation

## 2020-06-28 DIAGNOSIS — M810 Age-related osteoporosis without current pathological fracture: Secondary | ICD-10-CM | POA: Insufficient documentation

## 2020-06-28 DIAGNOSIS — N83202 Unspecified ovarian cyst, left side: Secondary | ICD-10-CM | POA: Insufficient documentation

## 2020-06-28 DIAGNOSIS — I2510 Atherosclerotic heart disease of native coronary artery without angina pectoris: Secondary | ICD-10-CM | POA: Insufficient documentation

## 2020-06-28 DIAGNOSIS — K635 Polyp of colon: Secondary | ICD-10-CM | POA: Insufficient documentation

## 2020-06-28 DIAGNOSIS — I10 Essential (primary) hypertension: Secondary | ICD-10-CM | POA: Insufficient documentation

## 2020-06-28 DIAGNOSIS — B009 Herpesviral infection, unspecified: Secondary | ICD-10-CM | POA: Insufficient documentation

## 2020-06-28 DIAGNOSIS — B977 Papillomavirus as the cause of diseases classified elsewhere: Secondary | ICD-10-CM | POA: Insufficient documentation

## 2020-06-28 DIAGNOSIS — E7841 Elevated Lipoprotein(a): Secondary | ICD-10-CM | POA: Insufficient documentation

## 2020-06-28 DIAGNOSIS — E041 Nontoxic single thyroid nodule: Secondary | ICD-10-CM | POA: Insufficient documentation

## 2020-06-28 NOTE — Addendum Note (Signed)
Addended by: Orland Mustard on: 06/28/2020 07:23 PM   Modules accepted: Orders

## 2020-06-30 ENCOUNTER — Telehealth: Payer: Self-pay | Admitting: Internal Medicine

## 2020-06-30 ENCOUNTER — Telehealth: Payer: Self-pay

## 2020-06-30 NOTE — Telephone Encounter (Signed)
Pt screened and scheduled with Dr Olivia Mackie tomorrow.

## 2020-06-30 NOTE — Telephone Encounter (Signed)
PT called to advise that she has a bad stye on her eye and would like to see what can be done about it. She would like a callback asap to find out if she needs to be seen or go to urgent care or if something can be prescribed.

## 2020-06-30 NOTE — Telephone Encounter (Signed)
LMTCB for labs. 

## 2020-07-01 ENCOUNTER — Encounter: Payer: Self-pay | Admitting: Internal Medicine

## 2020-07-01 ENCOUNTER — Other Ambulatory Visit: Payer: Self-pay

## 2020-07-01 ENCOUNTER — Ambulatory Visit (INDEPENDENT_AMBULATORY_CARE_PROVIDER_SITE_OTHER): Payer: Medicare Other | Admitting: Internal Medicine

## 2020-07-01 VITALS — BP 118/70 | HR 74 | Temp 97.9°F | Ht 63.86 in | Wt 114.2 lb

## 2020-07-01 DIAGNOSIS — H00012 Hordeolum externum right lower eyelid: Secondary | ICD-10-CM

## 2020-07-01 DIAGNOSIS — M25551 Pain in right hip: Secondary | ICD-10-CM | POA: Diagnosis not present

## 2020-07-01 DIAGNOSIS — M545 Low back pain, unspecified: Secondary | ICD-10-CM

## 2020-07-01 DIAGNOSIS — R82994 Hypercalciuria: Secondary | ICD-10-CM

## 2020-07-01 DIAGNOSIS — G894 Chronic pain syndrome: Secondary | ICD-10-CM | POA: Diagnosis not present

## 2020-07-01 DIAGNOSIS — F419 Anxiety disorder, unspecified: Secondary | ICD-10-CM

## 2020-07-01 DIAGNOSIS — G8929 Other chronic pain: Secondary | ICD-10-CM

## 2020-07-01 DIAGNOSIS — M25561 Pain in right knee: Secondary | ICD-10-CM

## 2020-07-01 DIAGNOSIS — F32A Depression, unspecified: Secondary | ICD-10-CM

## 2020-07-01 DIAGNOSIS — M25562 Pain in left knee: Secondary | ICD-10-CM | POA: Insufficient documentation

## 2020-07-01 MED ORDER — DOXYCYCLINE HYCLATE 100 MG PO TABS: 100.0000 mg | ORAL_TABLET | Freq: Two times a day (BID) | ORAL | 0 refills | Status: DC

## 2020-07-01 MED ORDER — HYDROCHLOROTHIAZIDE 25 MG PO TABS: 12.5000 mg | ORAL_TABLET | Freq: Every day | ORAL | 3 refills | Status: DC

## 2020-07-01 MED ORDER — ERYTHROMYCIN 5 MG/GM OP OINT: 1.0000 "application " | TOPICAL_OINTMENT | Freq: Three times a day (TID) | OPHTHALMIC | 0 refills | Status: DC

## 2020-07-01 NOTE — Progress Notes (Signed)
Chief Complaint  Patient presents with   Stye    On right eye since Tuesday. She has tried warm compresses, taking many showers & green tea bags with no relief. Eye is painful.    F/u  1. Stye right lower eye lid x Tuesday tried warm compress 2. Calciuria on hctz 12.5 mg qd but feels dehydrated asks about iv hydration therapy  3. Chronic pain knees, back, hip right hip wants referral to ortho and PT breakthrough did pt breakthrough in [place] and helped wants referral to [facility]  -let me know about ortho referral  4. CAD/HLD appt Dr. Debara Pickett wants referral to Dr Raliegh Ip in [place] [facility]  5. Anxiety/depression wants referral to therapy will let me know 6. Wt loss reduced amount of food intake due to stress but refer GI further w/u lost 15 lbs  Review of Systems  Constitutional:  Positive for weight loss.  HENT:  Negative for hearing loss.   Eyes:  Positive for redness.  Respiratory:  Negative for shortness of breath.   Cardiovascular:  Negative for chest pain.  Gastrointestinal:        Low appetite   Musculoskeletal:  Positive for back pain, joint pain and neck pain.  Skin:  Negative for rash.  Psychiatric/Behavioral:  Positive for depression. The patient is nervous/anxious.   Past Medical History:  Diagnosis Date   ANEMIA-NOS 09/28/2009   Aortic atherosclerosis ([place])    ASTHMA 09/28/2009   moderate persistent    CAD (coronary artery disease)    Colon polyps    COPD (chronic obstructive pulmonary disease) ([place])    COVID-19    01/20/2020   Emphysema lung ([facility])    vs bronchitis;paraseptal    Herpes    HPV in female    hpv 20+ years    Hyperlipemia    HYPOTHYROIDISM 09/28/2009   Lung nodule    Osteoporosis    Pneumonia    hosp in 2018   Thyroid nodule    right thyroid 5.2 x 1.8 x 1.9 cm right lobe   Past Surgical History:  Procedure Laterality Date   COLONOSCOPY     Family History  Problem Relation Age of Onset   Cancer Mother    Asthma Father    Cancer Father     Hyperlipidemia Father    Hyperlipidemia Brother    Social History   Socioeconomic History   Marital status: Married    Spouse name: Not on file   Number of children: Not on file   Years of education: Not on file   Highest education level: Not on file  Occupational History   Not on file  Tobacco Use   Smoking status: Former    Packs/day: 1.00    Pack years: 0.00    Types: Cigarettes    Quit date: 01/26/2013    Years since quitting: 7.4   Smokeless tobacco: Never  Substance and Sexual Activity   Alcohol use: Yes    Alcohol/week: 3.0 standard drinks    Types: 3 Standard drinks or equivalent per week   Drug use: No   Sexual activity: Not on file  Other Topics Concern   Not on file  Social History Narrative   Lives alone with dog    Moved from [place]    Bachelors degree massage therapy    Age 65 y.o abortion    lmp age 83    Social Determinants of Health   Financial Resource Strain: Not on file  Food Insecurity: Not  on file  Transportation Needs: Not on file  Physical Activity: Not on file  Stress: Not on file  Social Connections: Not on file  Intimate Partner Violence: Not on file   Current Meds  Medication Sig   ascorbic acid (VITAMIN C) 1000 MG tablet Take by mouth.   atorvastatin (LIPITOR) 10 MG tablet Take 1 tablet (10 mg total) by mouth daily.   budesonide-formoterol (SYMBICORT) 80-4.5 MCG/ACT inhaler Inhale into the lungs.   cholecalciferol (VITAMIN D) 25 MCG (1000 UNIT) tablet Take 2,000 Units by mouth daily.   clonazePAM (KLONOPIN) 0.5 MG tablet Take 0.25 mg by mouth at bedtime. Outside prescriber holistic MD Dr. Cherre Blanc   doxycycline (VIBRA-TABS) 100 MG tablet Take 1 tablet (100 mg total) by mouth 2 (two) times daily. With food   erythromycin ophthalmic ointment Place 1 application into the right eye 3 (three) times daily. X 4-7 days   HYDROcodone-acetaminophen (NORCO) 7.5-325 MG tablet Take by mouth. From holistic MD Dr. Sheppard Coil [place]    hydrOXYzine (VISTARIL) 25 MG capsule Take 50 mg by mouth at bedtime.   ipratropium (ATROVENT) 0.02 % nebulizer solution Take 500 mcg by nebulization 4 (four) times daily.   Ipratropium-Albuterol (COMBIVENT RESPIMAT) 20-100 MCG/ACT AERS respimat INHALE ONE PUFF INTO THE LUNGS 4 TIMES DAILY   Ipratropium-Albuterol (COMBIVENT RESPIMAT) 20-100 MCG/ACT AERS respimat Inhale 1 puff into the lungs every 6 (six) hours as needed for wheezing.   progesterone (PROMETRIUM) 100 MG capsule Take 200 mg by mouth daily.   risedronate (ACTONEL) 150 MG tablet Take 150 mg by mouth every 30 (thirty) days.   theophylline (THEODUR) 300 MG 12 hr tablet Take by mouth in the morning and at bedtime. Whole pill in the morning, half pill at night   thyroid (ARMOUR) 15 MG tablet 45 mg M-F, 30 mg Sat/Sunday   UNABLE TO FIND 4 tablets daily. AlgaeCal   valACYclovir (VALTREX) 1000 MG tablet 2 pills x 1 dose then X bid 3-7 days with outbreak until resolution   [DISCONTINUED] hydrochlorothiazide (HYDRODIURIL) 25 MG tablet Take 0.5 tablets (12.5 mg total) by mouth daily. (Patient taking differently: Take 25 mg by mouth daily. Verify dose at f/u 12.5 or 25 mg qd)   Allergies  Allergen Reactions   Cat Hair Extract    Dust Mite Extract    Sulfonamide Derivatives    Recent Results (from the past 2160 hour(s))  Hepatitis C antibody     Status: None   Collection Time: 06/23/20  8:55 AM  Result Value Ref Range   Hepatitis C Ab NON-REACTIVE NON-REACTIVE   SIGNAL TO CUT-OFF 0.00 <1.00    Comment: . HCV antibody was non-reactive. There is no laboratory  evidence of HCV infection. . In most cases, no further action is required. However, if recent HCV exposure is suspected, a test for HCV RNA (test code [id number]) is suggested. . For additional information please refer to http://education.questdiagnostics.com/faq/FAQ22v1 (This link is being provided for informational/ educational purposes only.) .   Vitamin D (25 hydroxy)      Status: None   Collection Time: 06/23/20  8:55 AM  Result Value Ref Range   VITD 49.00 30.00 - 100.00 ng/mL  T3, free     Status: None   Collection Time: 06/23/20  8:55 AM  Result Value Ref Range   T3, Free 3.9 2.3 - 4.2 pg/mL  T4, free     Status: None   Collection Time: 06/23/20  8:55 AM  Result Value Ref Range   Free  T4 0.84 0.60 - 1.60 ng/dL    Comment: Specimens from patients who are undergoing biotin therapy and /or ingesting biotin supplements may contain high levels of biotin.  The higher biotin concentration in these specimens interferes with this Free T4 assay.  Specimens that contain high levels  of biotin may cause false high results for this Free T4 assay.  Please interpret results in light of the total clinical presentation of the patient.    Thyroid peroxidase antibody     Status: Abnormal   Collection Time: 06/23/20  8:55 AM  Result Value Ref Range   Thyroperoxidase Ab SerPl-aCnc 183 (H) 0 - 34 IU/mL  Urinalysis, Routine w reflex microscopic     Status: None   Collection Time: 06/23/20  8:55 AM  Result Value Ref Range   Specific Gravity, UA 1.007 1.005 - 1.030   pH, UA 7.0 5.0 - 7.5   Color, UA Yellow Yellow   Appearance Ur Clear Clear   Leukocytes,UA Negative Negative   Protein,UA Negative Negative/Trace   Glucose, UA Negative Negative   Ketones, UA Negative Negative   RBC, UA Negative Negative   Bilirubin, UA Negative Negative   Urobilinogen, Ur 0.2 0.2 - 1.0 mg/dL   Nitrite, UA Negative Negative   Microscopic Examination Comment     Comment: Microscopic not indicated and not performed.  TSH     Status: Abnormal   Collection Time: 06/23/20  8:55 AM  Result Value Ref Range   TSH 4.90 (H) 0.35 - 4.50 uIU/mL  CBC with Differential/Platelet     Status: None   Collection Time: 06/23/20  8:55 AM  Result Value Ref Range   WBC 8.3 4.0 - 10.5 K/uL   RBC 4.46 3.87 - 5.11 Mil/uL   Hemoglobin 13.4 12.0 - 15.0 g/dL   HCT 40.1 36.0 - 46.0 %   MCV 89.8 78.0 - 100.0  fl   MCHC 33.5 30.0 - 36.0 g/dL   RDW 13.8 11.5 - 15.5 %   Platelets 370.0 150.0 - 400.0 K/uL   Neutrophils Relative % 65.6 43.0 - 77.0 %   Lymphocytes Relative 23.1 12.0 - 46.0 %   Monocytes Relative 7.3 3.0 - 12.0 %   Eosinophils Relative 2.9 0.0 - 5.0 %   Basophils Relative 1.1 0.0 - 3.0 %   Neutro Abs 5.4 1.4 - 7.7 K/uL   Lymphs Abs 1.9 0.7 - 4.0 K/uL   Monocytes Absolute 0.6 0.1 - 1.0 K/uL   Eosinophils Absolute 0.2 0.0 - 0.7 K/uL   Basophils Absolute 0.1 0.0 - 0.1 K/uL  Lipid panel     Status: None   Collection Time: 06/23/20  8:55 AM  Result Value Ref Range   Cholesterol 164 0 - 200 mg/dL    Comment: ATP III Classification       Desirable:  < 200 mg/dL               Borderline High:  200 - 239 mg/dL          High:  > = 240 mg/dL   Triglycerides 78.0 0.0 - 149.0 mg/dL    Comment: Normal:  <150 mg/dLBorderline High:  150 - 199 mg/dL   HDL 56.00 >39.00 mg/dL   VLDL 15.6 0.0 - 40.0 mg/dL   LDL Cholesterol 93 0 - 99 mg/dL   Total CHOL/HDL Ratio 3     Comment:                Men  Women1/2 Average Risk     3.4          3.3Average Risk          5.0          4.42X Average Risk          9.6          7.13X Average Risk          15.0          11.0                       NonHDL 108.25     Comment: NOTE:  Non-HDL goal should be 30 mg/dL higher than patient's LDL goal (i.e. LDL goal of < 70 mg/dL, would have non-HDL goal of < 100 mg/dL)  Comprehensive metabolic panel     Status: None   Collection Time: 06/23/20  8:55 AM  Result Value Ref Range   Sodium 142 135 - 145 mEq/L   Potassium 3.9 3.5 - 5.1 mEq/L   Chloride 106 96 - 112 mEq/L   CO2 23 19 - 32 mEq/L   Glucose, Bld 93 70 - 99 mg/dL   BUN 14 6 - 23 mg/dL   Creatinine, Ser 0.77 0.40 - 1.20 mg/dL   Total Bilirubin 0.4 0.2 - 1.2 mg/dL   Alkaline Phosphatase 57 39 - 117 U/L   AST 15 0 - 37 U/L   ALT 14 0 - 35 U/L   Total Protein 6.3 6.0 - 8.3 g/dL   Albumin 4.3 3.5 - 5.2 g/dL   GFR 81.17 >60.00 mL/min    Comment:  Calculated using the CKD-EPI Creatinine Equation (2021)   Calcium 9.5 8.4 - 10.5 mg/dL   Objective  Body mass index is 19.69 kg/m. Wt Readings from Last 3 Encounters:  07/01/20 114 lb 3.2 oz (51.8 kg)  05/31/20 117 lb (53.1 kg)  06/02/13 126 lb (57.2 kg)   Temp Readings from Last 3 Encounters:  07/01/20 97.9 F (36.6 C) (Oral)  05/31/20 98.5 F (36.9 C) (Oral)  06/02/13 98.1 F (36.7 C) (Oral)   BP Readings from Last 3 Encounters:  07/01/20 118/70  05/31/20 106/78  06/02/13 100/68   Pulse Readings from Last 3 Encounters:  07/01/20 74  05/31/20 99  06/02/13 75    Physical Exam Vitals and nursing note reviewed.  Constitutional:      Appearance: Normal appearance. She is well-developed and well-groomed.  HENT:     Head: Normocephalic and atraumatic.     Right Ear: There is impacted cerumen.     Left Ear: There is impacted cerumen.  Eyes:   Cardiovascular:     Rate and Rhythm: Normal rate and regular rhythm.     Heart sounds: Normal heart sounds. No murmur heard. Pulmonary:     Effort: Pulmonary effort is normal.     Breath sounds: Normal breath sounds.  Skin:    General: Skin is warm and dry.  Neurological:     General: No focal deficit present.     Mental Status: She is alert and oriented to person, place, and time. Mental status is at baseline.     Gait: Gait normal.  Psychiatric:        Attention and Perception: Attention and perception normal.        Mood and Affect: Mood and affect normal.        Speech: Speech normal.        Behavior: Behavior normal.  Behavior is cooperative.        Thought Content: Thought content normal.        Cognition and Memory: Cognition and memory normal.        Judgment: Judgment normal.    Assessment  Plan  Hordeolum externum of right lower eyelid - Plan: doxycycline (VIBRA-TABS) 100 MG tablet, erythromycin ophthalmic ointment Consider eye md if not better  Warm compress   Chronic pain syndrome - Plan: Ambulatory  referral to Physical Therapy  Chronic pain of right hip - Plan: Ambulatory referral to Physical Therapy  Chronic low back pain, unspecified back pain laterality, unspecified whether sciatica present - Plan: Ambulatory referral to Physical Therapy  Chronic pain of both knees - Plan: Ambulatory referral to Physical Therapy -consider ortho  Calciuria - Plan: hydrochlorothiazide (HYDRODIURIL) 12.5 MG tablet  If interested iv hydration  [facility] [address]  [place] [address]  [phone]   Anxiety and depression  Let me know about ortho/therapy referrals and eye    Therapy  [facility] or [facility] check to see if insurance will cover,   No referral needed   [facility] [place] [phone]   [facility] [id number] [facility] Flu shot not had Tdap utd 10/20/19  shingrix 2/2  prevnar utd 06/23/20 pna 23 utd [place] 3/3 consider booster   Hep C negative 06/23/20 Pap 03/28/17 negative consider in future h/o abnormal pap  Mammogram 12/09/19 negative  --referred [facility] when due 11/2020 she may go back other facility  dexa +osteoporsis dexa 08/24/19 -referred to [facility]    Colonoscopy h/o 20+ polyps as of 05/31/20 3-4 years ago  -referred [facility]   H/o thyroid nodule 5.2 x 1.8 x 1.9 cm right lobe thyroid H/o osteoporosis T score -2.9, -2.2, -2.3 H/o lung nodules Stress echo had 06/26/16   Results   Global LVEF (rest): Normal (LVEF >50%)   Global LVEF (stress): Hyperkinetic (LVEF >70%)   ECG   Normal sinus rhythm. Normal tracing   Arrhythmias   No rhythm abnormality.   Symptoms   No cardiovascular symptoms with maximal exercise.    TVUS/US 08/08/16 2.5 cm simple cyst left ovary     CT 09/25/2018 CT lung screen  IMPRESSION:  1. Lung-RADS 2, benign appearance or behavior. Continue annual   screening with low-dose chest CT without contrast in 12 months.  2.  Emphysema. (ICD10-J43.9)  3.  Aortic Atherosclerois (ICD10-170.0)    Electronically Signed    By: Misty Stanley M.D.    On: 09/26/2018 08:23 CT chest 01/07/20  IMPRESSION:  1. 1. Lung-RADS 2S, benign appearance or behavior. Continue annual  screening with low-dose chest CT without contrast in 12 months.  2. The "S" modifier above refers to potentially clinically  significant non lung cancer related findings. Specifically, there is  aortic atherosclerosis, in addition to 2 vessel coronary artery  disease. Please note that although the presence of coronary artery  calcium documents the presence of coronary artery disease, the  severity of this disease and any potential stenosis cannot be  assessed on this non-gated CT examination. Assessment for potential  risk factor modification, dietary therapy or pharmacologic therapy  may be warranted, if clinically indicated.  3. Mild diffuse bronchial wall thickening with mild centrilobular  and paraseptal emphysema; imaging findings suggestive of  underlying  COPD.   Aortic Atherosclerosis (ICD10-I70.0) and Emphysema (ICD10-J43.9).   Provider: Dr. Olivia Mackie McLean-Scocuzza-Internal Medicine

## 2020-07-01 NOTE — Patient Instructions (Addendum)
If interested iv hydration  [facility] [place] [address]  [phone]   Let me know about ortho/therapy referrals and eye    Therapy  [facility] or [facility] check to see if insurance will cover,   No referral needed   [facility] [place] [address] [phone]   [facility] [place] [address] [place] [address] [phone]   Stye A stye, also known as a hordeolum, is a bump that forms on an eyelid. It may look like a pimple next to the eyelash. A stye can form inside the eyelid (internal stye) or outside the eyelid (external stye). A stye can cause redness, swelling, and pain on the eyelid. Styes are very common. Anyone can get them at any age. They usually occur injust one eye, but you may have more than one in either eye. What are the causes? A stye is caused by an infection. The infection is almost always caused by bacteria called Staphylococcus aureus.This is a common type of bacteria that lives on the skin. An internal stye may result from an infected oil-producing gland inside the eyelid. An external stye may be caused by an infection at the base of the eyelash (hair follicle). What increases the risk? You are more likely to develop a stye if: You have had a stye before. You have any of these conditions: Diabetes. Red, itchy, inflamed eyelids (blepharitis). A skin condition such as seborrheic dermatitis or rosacea. High fat levels in your blood (lipids). What are the signs or symptoms? The most common symptom of a stye is eyelid pain. Internal styes are more painful than external styes. Other symptoms may include: Painful swelling of your eyelid. A scratchy feeling in your eye. Tearing and redness of your eye. Pus draining from the stye. How is this diagnosed? Your health care  provider may be able to diagnose a stye just by examining your eye. The health care provider may also check to make sure: You do not have a fever or other signs of a more serious infection. The infection has not spread to other parts of your eye or areas around your eye. How is this treated? Most styes will clear up in a few days without treatment or with warm compresses applied to the area. You may need to use antibiotic drops orointment to treat an infection. In some cases, if your stye does not heal with routine treatment, your health care provider may drain pus from the stye using a thin blade or needle. This may be done if the stye is large, causing a lot of pain, or affecting yourvision. Follow these instructions at home: Take over-the-counter and prescription medicines only as told by your health care provider. This includes eye drops or ointments. If you were prescribed an antibiotic medicine, apply or use it as told by your health care provider. Do not stop using the antibiotic even if your condition improves. Apply a warm, wet cloth (warm compress) to your eye for 5-10 minutes, 4 times a day. Clean the affected eyelid as directed by your health care provider. Do not wear contact lenses or eye makeup until your stye has healed. Do not try to pop or drain the stye. Do not rub your eye. Contact a health care provider if: You have chills or a fever. Your  stye does not go away after several days. Your stye affects your vision. Your eyeball becomes swollen, red, or painful. Get help right away if: You have pain when moving your eye around. Summary A stye is a bump that forms on an eyelid. It may look like a pimple next to the eyelash. A stye can form inside the eyelid (internal stye) or outside the eyelid (external stye). A stye can cause redness, swelling, and pain on the eyelid. Your health care provider may be able to diagnose a stye just by examining your eye. Apply a warm, wet cloth  (warm compress) to your eye for 5-10 minutes, 4 times a day. This information is not intended to replace advice given to you by your health care provider. Make sure you discuss any questions you have with your healthcare provider. Document Revised: 07/29/2019 Document Reviewed: 09/17/2019 Elsevier Patient Education  [place].

## 2020-07-01 NOTE — Addendum Note (Signed)
Addended by: Orland Mustard on: 07/01/2020 10:05 AM   Modules accepted: Orders

## 2020-07-07 ENCOUNTER — Telehealth: Payer: Self-pay | Admitting: Internal Medicine

## 2020-07-07 ENCOUNTER — Encounter: Payer: Self-pay | Admitting: Internal Medicine

## 2020-07-07 NOTE — Telephone Encounter (Signed)
Rejection Reason - Duplicate Referral - See referral currently in process- note that patient has been contacted multiple times by phone and mail but has not responded. Please ask patient to contact our office at [phone] so that we can schedule her appointment." Doyle Askew said on Jul 06, 2020 9:40 AM   Lft vm for pt to call [place] from [facility] endo

## 2020-07-27 ENCOUNTER — Telehealth: Payer: Self-pay | Admitting: Internal Medicine

## 2020-07-27 NOTE — Telephone Encounter (Signed)
Patient informed and verbalized understanding.  States she no longer wants to be seen at [facility] clinic endo. She would like a new referral to Dr Ronnald Collum

## 2020-07-27 NOTE — Telephone Encounter (Signed)
Referred to [facility] endocrine they can address osteoporosis Barbara Lawson is also endocrine she does not need 2  Referred ob/gyn Dr. Leafy Ro

## 2020-07-27 NOTE — Telephone Encounter (Signed)
PT called to see if she can get several referrals put in. The first one for Dr.Morayati at the [facility] for Osteoporosis. They are wanting the last bloodwork and previous notes from office visits to be sent in to [phone]. They also want to know if they can get a copy of their last lab work that they can pick up. They also would like to get a referral to Belva Agee at [facility] who is a gynecologist.

## 2020-07-27 NOTE — Telephone Encounter (Signed)
Okay for new referrals? Patient last seen 06/2020

## 2020-07-27 NOTE — Addendum Note (Signed)
Addended by: Orland Mustard on: 07/27/2020 01:31 PM   Modules accepted: Orders

## 2020-07-29 NOTE — Telephone Encounter (Signed)
New referral placed.

## 2020-07-29 NOTE — Addendum Note (Signed)
Addended by: Orland Mustard on: 07/29/2020 04:41 PM   Modules accepted: Orders

## 2020-07-29 NOTE — Telephone Encounter (Signed)
PT called to find out what is going on in regards to the referral to Dr.Morayati and would like for it to still be put in.

## 2020-08-04 ENCOUNTER — Telehealth: Payer: Self-pay | Admitting: Internal Medicine

## 2020-08-04 ENCOUNTER — Encounter: Payer: Self-pay | Admitting: Internal Medicine

## 2020-08-04 DIAGNOSIS — D126 Benign neoplasm of colon, unspecified: Secondary | ICD-10-CM | POA: Insufficient documentation

## 2020-08-04 NOTE — Telephone Encounter (Signed)
Dr.Morayti office called to advise that they did have the referral for the PT and would like for it to be faxed over to them at [phone].

## 2020-08-08 ENCOUNTER — Telehealth: Payer: Self-pay | Admitting: Internal Medicine

## 2020-08-08 ENCOUNTER — Encounter: Payer: Self-pay | Admitting: Internal Medicine

## 2020-08-08 NOTE — Telephone Encounter (Signed)
PT called to advise that they are needing a copy of their recent labs and would like a call in regards to when its ready for pickup. They would like either a vm or text if she can't be reached.

## 2020-08-08 NOTE — Progress Notes (Signed)
Release of information faxed to [facility]. Confirmation received and ROI sent to scan

## 2020-08-08 NOTE — Telephone Encounter (Signed)
Printed and Patient informed. Placed upfront

## 2020-08-09 DIAGNOSIS — I10 Essential (primary) hypertension: Secondary | ICD-10-CM | POA: Insufficient documentation

## 2020-08-12 ENCOUNTER — Telehealth: Payer: Self-pay | Admitting: Internal Medicine

## 2020-08-12 NOTE — Telephone Encounter (Signed)
"  Rejection Reason - Patient went elsewhere" Barbara Lawson said on Aug 12, 2020 1:29 PM  Msg from [facility] ob/gyn

## 2020-08-24 DIAGNOSIS — E063 Autoimmune thyroiditis: Secondary | ICD-10-CM | POA: Diagnosis not present

## 2020-08-24 DIAGNOSIS — E042 Nontoxic multinodular goiter: Secondary | ICD-10-CM | POA: Diagnosis not present

## 2020-08-24 DIAGNOSIS — E039 Hypothyroidism, unspecified: Secondary | ICD-10-CM | POA: Diagnosis not present

## 2020-08-24 DIAGNOSIS — J449 Chronic obstructive pulmonary disease, unspecified: Secondary | ICD-10-CM | POA: Diagnosis not present

## 2020-08-24 DIAGNOSIS — M810 Age-related osteoporosis without current pathological fracture: Secondary | ICD-10-CM | POA: Diagnosis not present

## 2020-08-31 DIAGNOSIS — M810 Age-related osteoporosis without current pathological fracture: Secondary | ICD-10-CM | POA: Diagnosis not present

## 2020-09-09 ENCOUNTER — Encounter: Payer: Self-pay | Admitting: Internal Medicine

## 2020-09-09 DIAGNOSIS — E063 Autoimmune thyroiditis: Secondary | ICD-10-CM | POA: Diagnosis not present

## 2020-09-09 DIAGNOSIS — M810 Age-related osteoporosis without current pathological fracture: Secondary | ICD-10-CM | POA: Diagnosis not present

## 2020-09-09 DIAGNOSIS — J449 Chronic obstructive pulmonary disease, unspecified: Secondary | ICD-10-CM | POA: Diagnosis not present

## 2020-09-09 DIAGNOSIS — E039 Hypothyroidism, unspecified: Secondary | ICD-10-CM | POA: Diagnosis not present

## 2020-09-09 DIAGNOSIS — E042 Nontoxic multinodular goiter: Secondary | ICD-10-CM | POA: Diagnosis not present

## 2020-09-13 DIAGNOSIS — M810 Age-related osteoporosis without current pathological fracture: Secondary | ICD-10-CM | POA: Diagnosis not present

## 2020-09-13 DIAGNOSIS — J449 Chronic obstructive pulmonary disease, unspecified: Secondary | ICD-10-CM | POA: Diagnosis not present

## 2020-09-13 DIAGNOSIS — E042 Nontoxic multinodular goiter: Secondary | ICD-10-CM | POA: Diagnosis not present

## 2020-09-13 DIAGNOSIS — E039 Hypothyroidism, unspecified: Secondary | ICD-10-CM | POA: Diagnosis not present

## 2020-09-13 DIAGNOSIS — E785 Hyperlipidemia, unspecified: Secondary | ICD-10-CM | POA: Diagnosis not present

## 2020-09-13 DIAGNOSIS — E063 Autoimmune thyroiditis: Secondary | ICD-10-CM | POA: Diagnosis not present

## 2020-09-15 DIAGNOSIS — I6523 Occlusion and stenosis of bilateral carotid arteries: Secondary | ICD-10-CM | POA: Diagnosis not present

## 2020-09-15 DIAGNOSIS — I25118 Atherosclerotic heart disease of native coronary artery with other forms of angina pectoris: Secondary | ICD-10-CM | POA: Diagnosis not present

## 2020-09-15 DIAGNOSIS — E785 Hyperlipidemia, unspecified: Secondary | ICD-10-CM | POA: Diagnosis not present

## 2020-09-15 DIAGNOSIS — I10 Essential (primary) hypertension: Secondary | ICD-10-CM | POA: Diagnosis not present

## 2020-09-20 DIAGNOSIS — Z6820 Body mass index (BMI) 20.0-20.9, adult: Secondary | ICD-10-CM | POA: Diagnosis not present

## 2020-09-20 DIAGNOSIS — R898 Other abnormal findings in specimens from other organs, systems and tissues: Secondary | ICD-10-CM | POA: Diagnosis not present

## 2020-09-20 DIAGNOSIS — Z8619 Personal history of other infectious and parasitic diseases: Secondary | ICD-10-CM | POA: Diagnosis not present

## 2020-09-20 LAB — HM PAP SMEAR: HM Pap smear: NORMAL

## 2020-09-21 ENCOUNTER — Ambulatory Visit (HOSPITAL_BASED_OUTPATIENT_CLINIC_OR_DEPARTMENT_OTHER): Payer: Medicare Other | Admitting: Internal Medicine

## 2020-09-22 DIAGNOSIS — R5381 Other malaise: Secondary | ICD-10-CM | POA: Diagnosis not present

## 2020-09-22 DIAGNOSIS — R634 Abnormal weight loss: Secondary | ICD-10-CM | POA: Diagnosis not present

## 2020-09-22 DIAGNOSIS — R7309 Other abnormal glucose: Secondary | ICD-10-CM | POA: Diagnosis not present

## 2020-10-03 DIAGNOSIS — I6523 Occlusion and stenosis of bilateral carotid arteries: Secondary | ICD-10-CM | POA: Insufficient documentation

## 2020-10-03 DIAGNOSIS — E782 Mixed hyperlipidemia: Secondary | ICD-10-CM | POA: Diagnosis not present

## 2020-10-03 DIAGNOSIS — I10 Essential (primary) hypertension: Secondary | ICD-10-CM | POA: Diagnosis not present

## 2020-10-03 DIAGNOSIS — I700 Atherosclerosis of aorta: Secondary | ICD-10-CM | POA: Diagnosis not present

## 2020-10-03 DIAGNOSIS — I2510 Atherosclerotic heart disease of native coronary artery without angina pectoris: Secondary | ICD-10-CM | POA: Diagnosis not present

## 2020-10-11 DIAGNOSIS — E063 Autoimmune thyroiditis: Secondary | ICD-10-CM | POA: Diagnosis not present

## 2020-10-11 DIAGNOSIS — E042 Nontoxic multinodular goiter: Secondary | ICD-10-CM | POA: Diagnosis not present

## 2020-10-11 DIAGNOSIS — J449 Chronic obstructive pulmonary disease, unspecified: Secondary | ICD-10-CM | POA: Diagnosis not present

## 2020-10-11 DIAGNOSIS — E039 Hypothyroidism, unspecified: Secondary | ICD-10-CM | POA: Diagnosis not present

## 2020-10-11 DIAGNOSIS — M810 Age-related osteoporosis without current pathological fracture: Secondary | ICD-10-CM | POA: Diagnosis not present

## 2020-10-11 DIAGNOSIS — E785 Hyperlipidemia, unspecified: Secondary | ICD-10-CM | POA: Diagnosis not present

## 2020-10-18 DIAGNOSIS — E039 Hypothyroidism, unspecified: Secondary | ICD-10-CM | POA: Diagnosis not present

## 2020-10-18 DIAGNOSIS — E063 Autoimmune thyroiditis: Secondary | ICD-10-CM | POA: Diagnosis not present

## 2020-10-18 DIAGNOSIS — E042 Nontoxic multinodular goiter: Secondary | ICD-10-CM | POA: Diagnosis not present

## 2020-10-18 DIAGNOSIS — J449 Chronic obstructive pulmonary disease, unspecified: Secondary | ICD-10-CM | POA: Diagnosis not present

## 2020-10-18 DIAGNOSIS — M810 Age-related osteoporosis without current pathological fracture: Secondary | ICD-10-CM | POA: Diagnosis not present

## 2020-10-18 DIAGNOSIS — Z23 Encounter for immunization: Secondary | ICD-10-CM | POA: Diagnosis not present

## 2020-10-21 DIAGNOSIS — Z23 Encounter for immunization: Secondary | ICD-10-CM | POA: Diagnosis not present

## 2020-11-01 DIAGNOSIS — E042 Nontoxic multinodular goiter: Secondary | ICD-10-CM | POA: Diagnosis not present

## 2020-11-01 DIAGNOSIS — E039 Hypothyroidism, unspecified: Secondary | ICD-10-CM | POA: Diagnosis not present

## 2020-11-01 DIAGNOSIS — J449 Chronic obstructive pulmonary disease, unspecified: Secondary | ICD-10-CM | POA: Diagnosis not present

## 2020-11-01 DIAGNOSIS — M810 Age-related osteoporosis without current pathological fracture: Secondary | ICD-10-CM | POA: Diagnosis not present

## 2020-11-08 DIAGNOSIS — H524 Presbyopia: Secondary | ICD-10-CM | POA: Diagnosis not present

## 2020-11-08 DIAGNOSIS — H52223 Regular astigmatism, bilateral: Secondary | ICD-10-CM | POA: Diagnosis not present

## 2020-11-08 DIAGNOSIS — H5212 Myopia, left eye: Secondary | ICD-10-CM | POA: Diagnosis not present

## 2020-11-17 DIAGNOSIS — J449 Chronic obstructive pulmonary disease, unspecified: Secondary | ICD-10-CM | POA: Diagnosis not present

## 2020-11-17 DIAGNOSIS — Z87891 Personal history of nicotine dependence: Secondary | ICD-10-CM | POA: Diagnosis not present

## 2020-11-28 DIAGNOSIS — L578 Other skin changes due to chronic exposure to nonionizing radiation: Secondary | ICD-10-CM | POA: Diagnosis not present

## 2020-11-28 DIAGNOSIS — L814 Other melanin hyperpigmentation: Secondary | ICD-10-CM | POA: Diagnosis not present

## 2020-11-28 DIAGNOSIS — L820 Inflamed seborrheic keratosis: Secondary | ICD-10-CM | POA: Diagnosis not present

## 2020-12-01 ENCOUNTER — Other Ambulatory Visit: Payer: Medicare Other

## 2020-12-01 ENCOUNTER — Ambulatory Visit (INDEPENDENT_AMBULATORY_CARE_PROVIDER_SITE_OTHER): Payer: Medicare Other | Admitting: Internal Medicine

## 2020-12-01 ENCOUNTER — Encounter: Payer: Self-pay | Admitting: Internal Medicine

## 2020-12-01 ENCOUNTER — Ambulatory Visit (INDEPENDENT_AMBULATORY_CARE_PROVIDER_SITE_OTHER): Payer: Medicare Other

## 2020-12-01 ENCOUNTER — Other Ambulatory Visit: Payer: Self-pay

## 2020-12-01 VITALS — BP 100/66 | HR 64 | Temp 97.1°F | Ht 63.86 in | Wt 130.4 lb

## 2020-12-01 DIAGNOSIS — M25562 Pain in left knee: Secondary | ICD-10-CM | POA: Diagnosis not present

## 2020-12-01 DIAGNOSIS — M810 Age-related osteoporosis without current pathological fracture: Secondary | ICD-10-CM | POA: Diagnosis not present

## 2020-12-01 NOTE — Progress Notes (Signed)
Chief Complaint  Patient presents with   Follow-up   Knee Pain    Left knee   F/u  1. Left knee pain 5/10 on and off for a while went hiking made worse and jumping on trampoline left knee pain and swelling  Tried ice and tens  2. Osteoporosis on prolia    Review of Systems  Constitutional:  Negative for weight loss.  HENT:  Negative for hearing loss.   Eyes:  Negative for blurred vision.  Respiratory:  Negative for shortness of breath.   Cardiovascular:  Negative for chest pain.  Gastrointestinal:  Negative for abdominal pain and blood in stool.  Genitourinary:  Negative for dysuria.  Musculoskeletal:  Positive for joint pain. Negative for falls.  Skin:  Negative for rash.  Neurological:  Negative for headaches.  Psychiatric/Behavioral:  Negative for depression.   Past Medical History:  Diagnosis Date   ANEMIA-NOS 09/28/2009   Aortic atherosclerosis ([place])    ASTHMA 09/28/2009   moderate persistent    CAD (coronary artery disease)    Colon polyps    COPD (chronic obstructive pulmonary disease) ([place])    COVID-19    01/20/2020   Emphysema lung ([facility])    vs bronchitis;paraseptal    Herpes    HPV in female    hpv 20+ years    Hyperlipemia    HYPOTHYROIDISM 09/28/2009   Lung nodule    Osteoporosis    Pneumonia    hosp in 2018   Thyroid nodule    right thyroid 5.2 x 1.8 x 1.9 cm right lobe   Past Surgical History:  Procedure Laterality Date   COLONOSCOPY     Family History  Problem Relation Age of Onset   Cancer Mother    Asthma Father    Cancer Father    Hyperlipidemia Father    Hyperlipidemia Brother    Social History   Socioeconomic History   Marital status: Married    Spouse name: Not on file   Number of children: Not on file   Years of education: Not on file   Highest education level: Not on file  Occupational History   Not on file  Tobacco Use   Smoking status: Former    Packs/day: 1.00    Types: Cigarettes    Quit date: 01/26/2013    Years since  quitting: 7.8   Smokeless tobacco: Never  Substance and Sexual Activity   Alcohol use: Yes    Alcohol/week: 3.0 standard drinks    Types: 3 Standard drinks or equivalent per week   Drug use: No   Sexual activity: Not on file  Other Topics Concern   Not on file  Social History Narrative   Lives alone with dog    Moved from [place]    Bachelors degree massage therapy    Age 41 y.o abortion    lmp age 58    Social Determinants of Health   Financial Resource Strain: Not on file  Food Insecurity: Not on file  Transportation Needs: Not on file  Physical Activity: Not on file  Stress: Not on file  Social Connections: Not on file  Intimate Partner Violence: Not on file   Current Meds  Medication Sig   ascorbic acid (VITAMIN C) 1000 MG tablet Take by mouth.   atorvastatin (LIPITOR) 10 MG tablet Take 1 tablet (10 mg total) by mouth daily.   budesonide-formoterol (SYMBICORT) 80-4.5 MCG/ACT inhaler Inhale into the lungs.   calcium citrate-vitamin D (CITRACAL+D) 315-200 MG-UNIT  tablet Take 2 tablets by mouth daily.   cholecalciferol (VITAMIN D) 25 MCG (1000 UNIT) tablet Take 2,000 Units by mouth daily.   clonazePAM (KLONOPIN) 0.5 MG tablet Take 0.25 mg by mouth at bedtime. Outside prescriber holistic MD Dr. Sheppard Coil Augustides   denosumab (PROLIA) 60 MG/ML SOSY injection Inject 60 mg into the skin every 6 (six) months.   hydrochlorothiazide (HYDRODIURIL) 25 MG tablet Take 0.5 tablets (12.5 mg total) by mouth daily.   hydrOXYzine (VISTARIL) 25 MG capsule Take 50 mg by mouth at bedtime.   ipratropium (ATROVENT) 0.02 % nebulizer solution Take 500 mcg by nebulization 4 (four) times daily.   Ipratropium-Albuterol (COMBIVENT RESPIMAT) 20-100 MCG/ACT AERS respimat Inhale 1 puff into the lungs every 6 (six) hours as needed for wheezing.   progesterone (PROMETRIUM) 100 MG capsule Take 200 mg by mouth daily.   theophylline (THEODUR) 300 MG 12 hr tablet Take by mouth in the morning and at bedtime.  Whole pill in the morning, half pill at night   thyroid (ARMOUR) 15 MG tablet 45 mg M-F, 30 mg Sat/Sunday   Allergies  Allergen Reactions   Cat Hair Extract    Dust Mite Extract    Sulfonamide Derivatives    No results found for this or any previous visit (from the past 2160 hour(s)). Objective  Body mass index is 22.48 kg/m. Wt Readings from Last 3 Encounters:  12/01/20 130 lb 6.4 oz (59.1 kg)  07/01/20 114 lb 3.2 oz (51.8 kg)  05/31/20 117 lb (53.1 kg)   Temp Readings from Last 3 Encounters:  12/01/20 (!) 97.1 F (36.2 C) (Temporal)  07/01/20 97.9 F (36.6 C) (Oral)  05/31/20 98.5 F (36.9 C) (Oral)   BP Readings from Last 3 Encounters:  12/01/20 100/66  07/01/20 118/70  05/31/20 106/78   Pulse Readings from Last 3 Encounters:  12/01/20 64  07/01/20 74  05/31/20 99    Physical Exam Vitals and nursing note reviewed.  Constitutional:      Appearance: Normal appearance. She is well-developed and well-groomed.  HENT:     Head: Normocephalic and atraumatic.  Eyes:     Conjunctiva/sclera: Conjunctivae normal.     Pupils: Pupils are equal, round, and reactive to light.  Cardiovascular:     Rate and Rhythm: Normal rate and regular rhythm.     Heart sounds: Normal heart sounds. No murmur heard. Pulmonary:     Effort: Pulmonary effort is normal.     Breath sounds: Normal breath sounds.  Abdominal:     General: Abdomen is flat. Bowel sounds are normal.     Tenderness: There is no abdominal tenderness.  Musculoskeletal:     Left lower leg: Swelling, tenderness and bony tenderness present.       Legs:  Skin:    General: Skin is warm and dry.  Neurological:     General: No focal deficit present.     Mental Status: She is alert and oriented to person, place, and time. Mental status is at baseline.     Cranial Nerves: Cranial nerves 2-12 are intact.     Gait: Gait is intact.  Psychiatric:        Attention and Perception: Attention and perception normal.         Mood and Affect: Mood and affect normal.        Speech: Speech normal.        Behavior: Behavior normal. Behavior is cooperative.        Thought Content: Thought content  normal.        Cognition and Memory: Cognition and memory normal.        Judgment: Judgment normal.    Assessment  Plan  Acute pain of left knee - Plan: DG Knee Complete 4 Views Left, AMB referral to orthopedics [facility]  Osteoporosis, unspecified osteoporosis type, unspecified pathological fracture presence  On prolia see Dr. Ronnald Collum for this and thyroid nodules  HM Flu shot utd Tdap utd 10/20/19  shingrix 2/2  prevnar utd 06/23/20  pna 23 utd [place] 4/4 consider booster    Hep C negative 06/23/20 Pap 03/28/17 negative consider in future h/o abnormal pap  PFW Dr. Lynnette Caffey   Mammogram 12/09/19 negative  --sch 12/2020 will go to [facility]  dexa +osteoporsis dexa 08/24/19  Prolia 10/2020 Dr. Ronnald Collum for thyroid and osteoporosis    Colonoscopy 01/21/18 tubular polyps x 5 repeat in 2024   H/o thyroid nodule 5.2 x 1.8 x 1.9 cm right lobe thyroid  H/o osteoporosis T score -2.9, -2.2, -2.3  H/o lung nodules Stress echo had 06/26/16   Results   Global LVEF (rest): Normal (LVEF >50%)   Global LVEF (stress): Hyperkinetic (LVEF >70%)   ECG   Normal sinus rhythm. Normal tracing   Arrhythmias   No rhythm abnormality.   Symptoms   No cardiovascular symptoms with maximal exercise.    TVUS/US 08/08/16 2.5 cm simple cyst left ovary      CT 09/25/2018 CT lung screen  IMPRESSION:  1. Lung-RADS 2, benign appearance or behavior. Continue annual  screening with low-dose chest CT without contrast in 12 months.  2.  Emphysema. (ICD10-J43.9)  3.  Aortic Atherosclerois (ICD10-170.0)    Electronically Signed    By: Misty Stanley M.D.    On: 09/26/2018 08:23 CT chest 01/07/20  IMPRESSION:  1. 1. Lung-RADS 2S, benign appearance or behavior. Continue annual  screening with low-dose chest CT without contrast in 12 months.  2.  The "S" modifier above refers to potentially clinically  significant non lung cancer related findings. Specifically, there is  aortic atherosclerosis, in addition to 2 vessel coronary artery  disease. Please note that although the presence of coronary artery  calcium documents the presence of coronary artery disease, the  severity of this disease and any potential stenosis cannot be  assessed on this non-gated CT examination. Assessment for potential  risk factor modification, dietary therapy or pharmacologic therapy  may be warranted, if clinically indicated.  3. Mild diffuse bronchial wall thickening with mild centrilobular  and paraseptal emphysema; imaging findings suggestive of underlying  COPD.   Aortic Atherosclerosis (ICD10-I70.0) and Emphysema (ICD10-J43.9).   Provider: Dr. Olivia Mackie McLean-Scocuzza-[facility] Medicine

## 2020-12-01 NOTE — Patient Instructions (Signed)
Tylenol  Aspercream with lidocaine Or voltaren gel   Knee Exercises Ask your health care provider which exercises are safe for you. Do exercises exactly as told by your health care provider and adjust them as directed. It is normal to feel mild stretching, pulling, tightness, or discomfort as you do these exercises. Stop right away if you feel sudden pain or your pain gets worse. Do not begin these exercises until told by your health care provider. Stretching and range-of-motion exercises These exercises warm up your muscles and joints and improve the movement and flexibility of your knee. These exercises also help to relieve pain and swelling. Knee extension, prone  Lie on your abdomen (prone position) on a bed. Place your left / right knee just beyond the edge of the surface so your knee is not on the bed. You can put a towel under your left / right thigh just above your kneecap for comfort. Relax your leg muscles and allow gravity to straighten your knee (extension). You should feel a stretch behind your left / right knee. Hold this position for __________ seconds. Scoot up so your knee is supported between repetitions. Repeat __________ times. Complete this exercise __________ times a day. Knee flexion, active  Lie on your back with both legs straight. If this causes back discomfort, bend your left / right knee so your foot is flat on the floor. Slowly slide your left / right heel back toward your buttocks. Stop when you feel a gentle stretch in the front of your knee or thigh (flexion). Hold this position for __________ seconds. Slowly slide your left / right heel back to the starting position. Repeat __________ times. Complete this exercise __________ times a day. Quadriceps stretch, prone  Lie on your abdomen on a firm surface, such as a bed or padded floor. Bend your left / right knee and hold your ankle. If you cannot reach your ankle or pant leg, loop a belt around your foot and  grab the belt instead. Gently pull your heel toward your buttocks. Your knee should not slide out to the side. You should feel a stretch in the front of your thigh and knee (quadriceps). Hold this position for __________ seconds. Repeat __________ times. Complete this exercise __________ times a day. Hamstring, supine  Lie on your back (supine position). Loop a belt or towel over the ball of your left / right foot. The ball of your foot is on the walking surface, right under your toes. Straighten your left / right knee and slowly pull on the belt to raise your leg until you feel a gentle stretch behind your knee (hamstring). Do not let your knee bend while you do this. Keep your other leg flat on the floor. Hold this position for __________ seconds. Repeat __________ times. Complete this exercise __________ times a day. Strengthening exercises These exercises build strength and endurance in your knee. Endurance is the ability to use your muscles for a long time, even after they get tired. Quadriceps, isometric This exercise strengthens the muscles in front of your thigh (quadriceps) without moving your knee joint (isometric). Lie on your back with your left / right leg extended and your other knee bent. Put a rolled towel or small pillow under your knee if told by your health care provider. Slowly tense the muscles in the front of your left / right thigh. You should see your kneecap slide up toward your hip or see increased dimpling just above the knee. This motion  will push the back of the knee toward the floor. For __________ seconds, hold the muscle as tight as you can without increasing your pain. Relax the muscles slowly and completely. Repeat __________ times. Complete this exercise __________ times a day. Straight leg raises This exercise strengthens the muscles in front of your thigh (quadriceps) and the muscles that move your hips (hip flexors). Lie on your back with your left /  right leg extended and your other knee bent. Tense the muscles in the front of your left / right thigh. You should see your kneecap slide up or see increased dimpling just above the knee. Your thigh may even shake a bit. Keep these muscles tight as you raise your leg 4-6 inches (10-15 cm) off the floor. Do not let your knee bend. Hold this position for __________ seconds. Keep these muscles tense as you lower your leg. Relax your muscles slowly and completely after each repetition. Repeat __________ times. Complete this exercise __________ times a day. Hamstring, isometric  Lie on your back on a firm surface. Bend your left / right knee about __________ degrees. Dig your left / right heel into the surface as if you are trying to pull it toward your buttocks. Tighten the muscles in the back of your thighs (hamstring) to "dig" as hard as you can without increasing any pain. Hold this position for __________ seconds. Release the tension gradually and allow your muscles to relax completely for __________ seconds after each repetition. Repeat __________ times. Complete this exercise __________ times a day. Hamstring curls If told by your health care provider, do this exercise while wearing ankle weights. Begin with __________lb / kg weights. Then increase the weight by 1 lb (0.5 kg) increments. Do not wear ankle weights that are more than __________lb / kg. Lie on your abdomen with your legs straight. Bend your left / right knee as far as you can without feeling pain. Keep your hips flat against the floor. Hold this position for __________ seconds. Slowly lower your leg to the starting position. Repeat __________ times. Complete this exercise __________ times a day. Squats This exercise strengthens the muscles in front of your thigh and knee (quadriceps). Stand in front of a table, with your feet and knees pointing straight ahead. You may rest your hands on the table for balance but not for  support. Slowly bend your knees and lower your hips like you are going to sit in a chair. Keep your weight over your heels, not over your toes. Keep your lower legs upright so they are parallel with the table legs. Do not let your hips go lower than your knees. Do not bend lower than told by your health care provider. If your knee pain increases, do not bend as low. Hold the squat position for __________ seconds. Slowly push with your legs to return to standing. Do not use your hands to pull yourself to standing. Repeat __________ times. Complete this exercise __________ times a day. Wall slides This exercise strengthens the muscles in front of your thigh and knee (quadriceps). Lean your back against a smooth wall or door, and walk your feet out 18-24 inches (46-61 cm) from it. Place your feet hip-width apart. Slowly slide down the wall or door until your knees bend __________ degrees. Keep your knees over your heels, not over your toes. Keep your knees in line with your hips. Hold this position for __________ seconds. Repeat __________ times. Complete this exercise __________ times a day. Straight  leg raises, side-lying This exercise strengthens the muscles that rotate the leg at the hip and move it away from your body (hip abductors). Lie on your side with your left / right leg in the top position. Lie so your head, shoulder, knee, and hip line up. You may bend your bottom knee to help you keep your balance. Roll your hips slightly forward so your hips are stacked directly over each other and your left / right knee is facing forward. Leading with your heel, lift your top leg 4-6 inches (10-15 cm). You should feel the muscles in your outer hip lifting. Do not let your foot drift forward. Do not let your knee roll toward the ceiling. Hold this position for __________ seconds. Slowly return your leg to the starting position. Let your muscles relax completely after each repetition. Repeat  __________ times. Complete this exercise __________ times a day. Straight leg raises, prone This exercise stretches the muscles that move your hips away from the front of the pelvis (hip extensors). Lie on your abdomen on a firm surface. You can put a pillow under your hips if that is more comfortable. Tense the muscles in your buttocks and lift your left / right leg about 4-6 inches (10-15 cm). Keep your knee straight as you lift your leg. Hold this position for __________ seconds. Slowly lower your leg to the starting position. Let your leg relax completely after each repetition. Repeat __________ times. Complete this exercise __________ times a day. This information is not intended to replace advice given to you by your health care provider. Make sure you discuss any questions you have with your health care provider. Document Revised: 09/20/2020 Document Reviewed: 09/20/2020 Elsevier Patient Education  [place].  Exercises for Chronic Knee Pain Chronic knee pain is pain that lasts longer than 3 months. For most people with chronic knee pain, exercise and weight loss is an important part of treatment. Your health care provider may want you to focus on: Strengthening the muscles that support your knee. This can take pressure off your knee and lessen pain. Preventing knee stiffness. Maintaining or increasing how far you can move your knee. Losing weight (if this applies) to take pressure off your knee, decrease your risk for injury, and make it easier for you to exercise. Your health care provider will help you develop an exercise program that matches your needs and physical abilities. Below are simple, low-impact exercises you can do at home. Ask your health care provider or a physical therapist how often you should do your exercise program and how many times to repeat each exercise. General safety tips Follow these safety tips for exercising with chronic knee pain: Get your health  care provider's approval before doing any exercises. Start slowly and stop any time an exercise causes pain. Do not exercise if your knee pain is flaring up. Warm up first. Stretching a cold muscle can cause an injury. Do 5-10 minutes of easy movement or light stretching before beginning your exercise routine. Do 5-10 minutes of low-impact activity (like walking or cycling) before starting strengthening exercises. Contact your health care provider any time you have pain during or after exercising. Exercise may cause discomfort but should not be painful. It is normal to be a little stiff or sore after exercising.  Stretching and range-of-motion exercises Front thigh stretch  Stand up straight and support your body by holding on to a chair or resting one hand on a wall. With your legs straight  and close together, bend one knee to lift your heel up toward your buttocks. Using one hand for support, grab your ankle with your free hand. Pull your foot up closer toward your buttocks to feel the stretch in front of your thigh. Hold the stretch for 30 seconds. Repeat __________ times. Complete this exercise __________ times a day. Back thigh stretch  Sit on the floor with your back straight and your legs out straight in front of you. Place the palms of your hands on the floor and slide them toward your feet as you bend at the hip. Try to touch your nose to your knees and feel the stretch in the back of your thighs. Hold for 30 seconds. Repeat __________ times. Complete this exercise __________ times a day. Calf stretch  Stand facing a wall. Place the palms of your hands flat against the wall, arms extended, and lean slightly against the wall. Get into a lunge position with one leg bent at the knee and the other leg stretched out straight behind you. Keep both feet facing the wall and increase the bend in your knee while keeping the heel of the other leg flat on the ground. You should feel the  stretch in your calf. Hold for 30 seconds. Repeat __________ times. Complete this exercise __________ times a day. Strengthening exercises Straight leg lift Lie on your back with one knee bent and the other leg out straight. Slowly lift the straight leg without bending the knee. Lift until your foot is about 12 inches (30 cm) off the floor. Hold for 3-5 seconds and slowly lower your leg. Repeat __________ times. Complete this exercise __________ times a day. Single leg dip Stand between two chairs and put both hands on the backs of the chairs for support. Extend one leg out straight with your body weight resting on the heel of the standing leg. Slowly bend your standing knee to dip your body to the level that is comfortable for you. Hold for 3-5 seconds. Repeat __________ times. Complete this exercise __________ times a day. Hamstring curls Stand straight, knees close together, facing the back of a chair. Hold on to the back of a chair with both hands. Keep one leg straight. Bend the other knee while bringing the heel up toward the buttock until the knee is bent at a 90-degree angle (right angle). Hold for 3-5 seconds. Repeat __________ times. Complete this exercise __________ times a day. Wall squat Stand straight with your back, hips, and head against a wall. Step forward one foot at a time with your back still against the wall. Your feet should be 2 feet (61 cm) from the wall at shoulder width. Keeping your back, hips, and head against the wall, slide down the wall to as close of a sitting position as you can get. Hold for 5-10 seconds, then slowly slide back up. Repeat __________ times. Complete this exercise __________ times a day. Step-ups Step up with one foot onto a sturdy platform or stool that is about 6 inches (15 cm) high. Face sideways with one foot on the platform and one on the ground. Place all your weight on the platform foot and lift your body off the ground until your  knee extends. Let your other leg hang free to the side. Hold for 3-5 seconds then slowly lower your weight down to the floor foot. Repeat __________ times. Complete this exercise __________ times a day. Contact a health care provider if: Your exercise causes pain. Your  pain is worse after you exercise. Your pain prevents you from doing your exercises. This information is not intended to replace advice given to you by your health care provider. Make sure you discuss any questions you have with your health care provider. Document Revised: 05/14/2019 Document Reviewed: 01/05/2019 Elsevier Patient Education  2022 [employer].

## 2020-12-05 NOTE — Progress Notes (Signed)
Mild osteoarthritis in left knee follow up with ortho  Referral placed

## 2020-12-07 DIAGNOSIS — M1712 Unilateral primary osteoarthritis, left knee: Secondary | ICD-10-CM | POA: Diagnosis not present

## 2020-12-08 DIAGNOSIS — J449 Chronic obstructive pulmonary disease, unspecified: Secondary | ICD-10-CM | POA: Diagnosis not present

## 2020-12-12 DIAGNOSIS — Z1231 Encounter for screening mammogram for malignant neoplasm of breast: Secondary | ICD-10-CM | POA: Diagnosis not present

## 2020-12-12 LAB — HM MAMMOGRAPHY

## 2020-12-20 ENCOUNTER — Encounter: Payer: Self-pay | Admitting: Internal Medicine

## 2020-12-26 ENCOUNTER — Telehealth: Payer: Self-pay | Admitting: Internal Medicine

## 2020-12-26 NOTE — Telephone Encounter (Signed)
Patient has a history of lower and middle back pain. She is requesting x-rays of her back so she can take to the chiropractor.

## 2020-12-27 NOTE — Telephone Encounter (Signed)
Please advise, have you seen Patient for this before? Does she need an appointment?

## 2020-12-27 NOTE — Telephone Encounter (Signed)
She has Xrays from 2019 and 2018 prior to being seen my me if we have not discussed this issue  Sch appt please

## 2020-12-28 NOTE — Telephone Encounter (Signed)
Left message to return call. Okay to schedule Patient for an appointment when calling back

## 2020-12-29 ENCOUNTER — Ambulatory Visit: Payer: Medicare Other | Admitting: Internal Medicine

## 2020-12-29 NOTE — Telephone Encounter (Signed)
Patient calling back in and will be informed and scheduled by the front desk if Patient is agreeable

## 2021-01-02 ENCOUNTER — Telehealth: Payer: Self-pay | Admitting: Internal Medicine

## 2021-01-02 NOTE — Telephone Encounter (Signed)
Faxed signed ROI to [facility] requesting Patient's last pap results. Received confirmation of fax going through.   ROI sent to scan.

## 2021-01-05 ENCOUNTER — Ambulatory Visit (INDEPENDENT_AMBULATORY_CARE_PROVIDER_SITE_OTHER): Payer: Medicare Other | Admitting: Internal Medicine

## 2021-01-05 ENCOUNTER — Ambulatory Visit (INDEPENDENT_AMBULATORY_CARE_PROVIDER_SITE_OTHER): Payer: Medicare Other

## 2021-01-05 ENCOUNTER — Encounter: Payer: Self-pay | Admitting: Internal Medicine

## 2021-01-05 ENCOUNTER — Other Ambulatory Visit: Payer: Self-pay

## 2021-01-05 VITALS — BP 110/78 | HR 72 | Temp 97.0°F | Ht 63.86 in | Wt 134.2 lb

## 2021-01-05 DIAGNOSIS — M549 Dorsalgia, unspecified: Secondary | ICD-10-CM | POA: Diagnosis not present

## 2021-01-05 DIAGNOSIS — G8929 Other chronic pain: Secondary | ICD-10-CM

## 2021-01-05 DIAGNOSIS — M542 Cervicalgia: Secondary | ICD-10-CM | POA: Diagnosis not present

## 2021-01-05 DIAGNOSIS — M419 Scoliosis, unspecified: Secondary | ICD-10-CM | POA: Diagnosis not present

## 2021-01-05 DIAGNOSIS — Z0000 Encounter for general adult medical examination without abnormal findings: Secondary | ICD-10-CM

## 2021-01-05 DIAGNOSIS — M40204 Unspecified kyphosis, thoracic region: Secondary | ICD-10-CM | POA: Diagnosis not present

## 2021-01-05 DIAGNOSIS — M546 Pain in thoracic spine: Secondary | ICD-10-CM | POA: Diagnosis not present

## 2021-01-05 DIAGNOSIS — M50322 Other cervical disc degeneration at C5-C6 level: Secondary | ICD-10-CM | POA: Diagnosis not present

## 2021-01-05 DIAGNOSIS — M4312 Spondylolisthesis, cervical region: Secondary | ICD-10-CM | POA: Diagnosis not present

## 2021-01-05 DIAGNOSIS — M50323 Other cervical disc degeneration at C6-C7 level: Secondary | ICD-10-CM | POA: Diagnosis not present

## 2021-01-05 DIAGNOSIS — M545 Low back pain, unspecified: Secondary | ICD-10-CM

## 2021-01-05 MED ORDER — CYCLOBENZAPRINE HCL 10 MG PO TABS: 10.0000 mg | ORAL_TABLET | Freq: Every evening | ORAL | 3 refills | Status: DC | PRN

## 2021-01-05 NOTE — Patient Instructions (Addendum)
Pna 23  (pneumovax) due 06/23/21 do it pplease on same days as labs  Let me know if you want to do PT in the future [facility], [facility], or [facility] let me know  [facility] Barbara Lawson   Let me know if you want to do this Consider Ct cardiac calcium score test to look for plaque build up in heart arteries  Pneumococcal Vaccine, Polyvalent solution for injection What is this medication? PNEUMOCOCCAL VACCINE, POLYVALENT (NEU mo KOK al  vak SEEN,  pol ee VEY luhnt) is a vaccine to prevent pneumococcus bacteria infection. These bacteria are a major cause of ear infections, Strep throat infections, and serious pneumonia, meningitis, or blood infections worldwide. These vaccines help the body to produce antibodies (protective substances) that help your body defend against these bacteria. This vaccine is recommended for people 65 years of age and older with health problems. It is also recommended for all adults over 65 years old. This vaccine will not treat an infection. This medicine may be used for other purposes; ask your health care provider or pharmacist if you have questions. COMMON BRAND NAME(S): Pneumovax 23 What should I tell my care team before I take this medication? They need to know if you have any of these conditions: bleeding problems bone marrow or organ transplant cancer, Hodgkin's disease fever infection immune system problems low platelet count in the blood seizures an unusual or allergic reaction to pneumococcal vaccine, diphtheria toxoid, other vaccines, latex, other medicines, foods, dyes, or preservatives pregnant or trying to get pregnant breast-feeding How should I use this medication? This vaccine is for injection into a muscle or under the skin. It is given by a health care professional. A copy of Vaccine Information Statements will be given before each vaccination. Read this sheet carefully each time. The sheet may change frequently. Talk to your pediatrician regarding  the use of this medicine in children. While this drug may be prescribed for children as young as 65 years of age for selected conditions, precautions do apply. Overdosage: If you think you have taken too much of this medicine contact a poison control center or emergency room at once. NOTE: This medicine is only for you. Do not share this medicine with others. What if I miss a dose? It is important not to miss your dose. Call your doctor or health care professional if you are unable to keep an appointment. What may interact with this medication? medicines for cancer chemotherapy medicines that suppress your immune function medicines that treat or prevent blood clots like warfarin, enoxaparin, and dalteparin steroid medicines like prednisone or cortisone This list may not describe all possible interactions. Give your health care provider a list of all the medicines, herbs, non-prescription drugs, or dietary supplements you use. Also tell them if you smoke, drink alcohol, or use illegal drugs. Some items may interact with your medicine. What should I watch for while using this medication? Mild fever and pain should go away in 3 days or less. Report any unusual symptoms to your doctor or health care professional. What side effects may I notice from receiving this medication? Side effects that you should report to your doctor or health care professional as soon as possible: allergic reactions like skin rash, itching or hives, swelling of the face, lips, or tongue breathing problems confused fever over 102 degrees F pain, tingling, numbness in the hands or feet seizures unusual bleeding or bruising unusual muscle weakness Side effects that usually do not require medical  attention (report to your doctor or health care professional if they continue or are bothersome): aches and pains diarrhea fever of 102 degrees F or less headache irritable loss of appetite pain, tender at site where  injected trouble sleeping This list may not describe all possible side effects. Call your doctor for medical advice about side effects. You may report side effects to FDA at 1-800-FDA-1088. Where should I keep my medication? This does not apply. This vaccine is given in a clinic, pharmacy, doctor's office, or other health care setting and will not be stored at home. NOTE: This sheet is a summary. It may not cover all possible information. If you have questions about this medicine, talk to your doctor, pharmacist, or health care provider.  2022 Elsevier/Gold Standard (2007-08-31 00:00:00)  Coronary Calcium Scan A coronary calcium scan is an imaging test used to look for deposits of plaque in the inner lining of the blood vessels of the heart (coronary arteries). Plaque is made up of calcium, protein, and fatty substances. These deposits of plaque can partly clog and narrow the coronary arteries without producing any symptoms or warning signs. This puts a person at risk for a heart attack. This test is recommended for people who are at moderate risk for heart disease. The test can find plaque deposits before symptoms develop. Tell a health care provider about: Any allergies you have. All medicines you are taking, including vitamins, herbs, eye drops, creams, and over-the-counter medicines. Any problems you or family members have had with anesthetic medicines. Any blood disorders you have. Any surgeries you have had. Any medical conditions you have. Whether you are pregnant or may be pregnant. What are the risks? Generally, this is a safe procedure. However, problems may occur, including: Harm to a pregnant woman and her unborn baby. This test involves the use of radiation. Radiation exposure can be dangerous to a pregnant woman and her unborn baby. If you are pregnant or think you may be pregnant, you should not have this procedure done. Slight increase in the risk of cancer. This is because of  the radiation involved in the test. What happens before the procedure? Ask your health care provider for any specific instructions on how to prepare for this procedure. You may be asked to avoid products that contain caffeine, tobacco, or nicotine for 4 hours before the procedure. What happens during the procedure?  You will undress and remove any jewelry from your neck or chest. You will put on a hospital gown. Sticky electrodes will be placed on your chest. The electrodes will be connected to an electrocardiogram (ECG) machine to record a tracing of the electrical activity of your heart. You will lie down on a curved bed that is attached to the [place]. You may be given medicine to slow down your heart rate so that clear pictures can be created. You will be moved into the CT scanner, and the CT scanner will take pictures of your heart. During this time, you will be asked to lie still and hold your breath for 2-3 seconds at a time while each picture of your heart is being taken. The procedure may vary among health care providers and hospitals. What happens after the procedure? You can get dressed. You can return to your normal activities. It is up to you to get the results of your procedure. Ask your health care provider, or the department that is doing the procedure, when your results will be ready. Summary A coronary calcium  scan is an imaging test used to look for deposits of plaque in the inner lining of the blood vessels of the heart (coronary arteries). Plaque is made up of calcium, protein, and fatty substances. Generally, this is a safe procedure. Tell your health care provider if you are pregnant or may be pregnant. Ask your health care provider for any specific instructions on how to prepare for this procedure. A CT scanner will take pictures of your heart. You can return to your normal activities after the scan is done. This information is not intended to replace advice given to  you by your health care provider. Make sure you discuss any questions you have with your health care provider. Document Revised: 07/24/2018 Document Reviewed: 07/29/2018 Elsevier Patient Education  [place].

## 2021-01-05 NOTE — Progress Notes (Signed)
Chief Complaint  Patient presents with   Back Pain   F/u  1. Chronic neck pain looking down and to the left and scoliosis mid and low back going to see Dr. Max Fickle in [place] soon and needs Xrays tries ice tens and traction pain at times 8/10 non today  2. Cad disc ct calcium score declines today ordering of this test est with cards cad noted on ct lung cancer screening and test upcoming  Review of Systems  Constitutional:  Negative for weight loss.  HENT:  Negative for hearing loss.   Eyes:  Negative for blurred vision.  Respiratory:  Negative for shortness of breath.   Cardiovascular:  Negative for chest pain.  Gastrointestinal:  Negative for abdominal pain and blood in stool.  Genitourinary:  Negative for dysuria.  Musculoskeletal:  Positive for back pain and neck pain. Negative for falls and joint pain.  Skin:  Negative for rash.  Neurological:  Negative for headaches.  Psychiatric/Behavioral:  Negative for depression.   Past Medical History:  Diagnosis Date   ANEMIA-NOS 09/28/2009   Aortic atherosclerosis ([place])    ASTHMA 09/28/2009   moderate persistent    CAD (coronary artery disease)    Colon polyps    COPD (chronic obstructive pulmonary disease) ([place])    COVID-19    01/20/2020   Emphysema lung ([facility])    vs bronchitis;paraseptal    Herpes    HPV in female    hpv 20+ years    Hyperlipemia    HYPOTHYROIDISM 09/28/2009   Lung nodule    Osteoporosis    Pneumonia    hosp in 2018   Thyroid nodule    right thyroid 5.2 x 1.8 x 1.9 cm right lobe   Past Surgical History:  Procedure Laterality Date   COLONOSCOPY     Family History  Problem Relation Age of Onset   Cancer Mother    Asthma Father    Cancer Father    Hyperlipidemia Father    Hyperlipidemia Brother    Social History   Socioeconomic History   Marital status: Married    Spouse name: Not on file   Number of children: Not on file   Years of education: Not on file   Highest education level: Not  on file  Occupational History   Not on file  Tobacco Use   Smoking status: Former    Packs/day: 1.00    Types: Cigarettes    Quit date: 01/26/2013    Years since quitting: 7.9   Smokeless tobacco: Never  Substance and Sexual Activity   Alcohol use: Yes    Alcohol/week: 3.0 standard drinks    Types: 3 Standard drinks or equivalent per week   Drug use: No   Sexual activity: Not on file  Other Topics Concern   Not on file  Social History Narrative   Lives alone with dog    Moved from [place]    Bachelors degree massage therapy    Age 65 y.o abortion    lmp age 77    Social Determinants of Health   Financial Resource Strain: Not on file  Food Insecurity: Not on file  Transportation Needs: Not on file  Physical Activity: Not on file  Stress: Not on file  Social Connections: Not on file  Intimate Partner Violence: Not on file   Current Meds  Medication Sig   ascorbic acid (VITAMIN C) 1000 MG tablet Take by mouth.   atorvastatin (LIPITOR) 10 MG tablet Take  1 tablet (10 mg total) by mouth daily.   budesonide-formoterol (SYMBICORT) 80-4.5 MCG/ACT inhaler Inhale into the lungs.   calcium citrate-vitamin D (CITRACAL+D) 315-200 MG-UNIT tablet Take 2 tablets by mouth daily.   cholecalciferol (VITAMIN D) 25 MCG (1000 UNIT) tablet Take 2,000 Units by mouth daily.   clonazePAM (KLONOPIN) 0.5 MG tablet Take 0.25 mg by mouth at bedtime. Outside prescriber holistic MD Dr. Cherre Blanc   cyclobenzaprine (FLEXERIL) 10 MG tablet Take 1 tablet (10 mg total) by mouth at bedtime as needed for muscle spasms.   denosumab (PROLIA) 60 MG/ML SOSY injection Inject 60 mg into the skin every 6 (six) months.   hydrochlorothiazide (HYDRODIURIL) 25 MG tablet Take 0.5 tablets (12.5 mg total) by mouth daily.   HYDROcodone-acetaminophen (NORCO) 7.5-325 MG tablet Take by mouth. From holistic MD Dr. Sheppard Coil Augustides   hydrOXYzine (VISTARIL) 25 MG capsule Take 50 mg by mouth at bedtime.   ipratropium  (ATROVENT) 0.02 % nebulizer solution Take 500 mcg by nebulization 4 (four) times daily.   Ipratropium-Albuterol (COMBIVENT RESPIMAT) 20-100 MCG/ACT AERS respimat Inhale 1 puff into the lungs every 6 (six) hours as needed for wheezing.   progesterone (PROMETRIUM) 100 MG capsule Take 200 mg by mouth daily.   theophylline (THEODUR) 300 MG 12 hr tablet Take by mouth in the morning and at bedtime. Whole pill in the morning, half pill at night   thyroid (ARMOUR) 15 MG tablet 45 mg M-F, 30 mg Sat/Sunday   Allergies  Allergen Reactions   Cat Hair Extract    Dust Mite Extract    Sulfonamide Derivatives    No results found for this or any previous visit (from the past 2160 hour(s)). Objective  Body mass index is 23.14 kg/m. Wt Readings from Last 3 Encounters:  01/05/21 134 lb 3.2 oz (60.9 kg)  12/01/20 130 lb 6.4 oz (59.1 kg)  07/01/20 114 lb 3.2 oz (51.8 kg)   Temp Readings from Last 3 Encounters:  01/05/21 (!) 97 F (36.1 C) (Temporal)  12/01/20 (!) 97.1 F (36.2 C) (Temporal)  07/01/20 97.9 F (36.6 C) (Oral)   BP Readings from Last 3 Encounters:  01/05/21 110/78  12/01/20 100/66  07/01/20 118/70   Pulse Readings from Last 3 Encounters:  01/05/21 72  12/01/20 64  07/01/20 74    Physical Exam Vitals and nursing note reviewed.  Constitutional:      Appearance: Normal appearance. She is well-developed and well-groomed.  HENT:     Head: Normocephalic and atraumatic.  Eyes:     Conjunctiva/sclera: Conjunctivae normal.     Pupils: Pupils are equal, round, and reactive to light.  Cardiovascular:     Rate and Rhythm: Normal rate and regular rhythm.     Heart sounds: Normal heart sounds. No murmur heard. Pulmonary:     Effort: Pulmonary effort is normal.     Breath sounds: Normal breath sounds.  Abdominal:     General: Abdomen is flat. Bowel sounds are normal.     Tenderness: There is no abdominal tenderness.  Musculoskeletal:        General: No tenderness.     Cervical  back: Pain with movement present. Normal range of motion.     Thoracic back: Normal.     Lumbar back: Normal.  Skin:    General: Skin is warm and dry.  Neurological:     General: No focal deficit present.     Mental Status: She is alert and oriented to person, place, and time. Mental status  is at baseline.     Cranial Nerves: Cranial nerves 2-12 are intact.     Gait: Gait is intact.  Psychiatric:        Attention and Perception: Attention and perception normal.        Mood and Affect: Mood and affect normal.        Speech: Speech normal.        Behavior: Behavior normal. Behavior is cooperative.        Thought Content: Thought content normal.        Cognition and Memory: Cognition and memory normal.        Judgment: Judgment normal.    Assessment  Plan  Mid back pain h/o scoliosis - Plan: DG Thoracic Spine 2 View, cyclobenzaprine (FLEXERIL) 10 MG tablet  Chronic low back pain, unspecified back pain laterality, unspecified whether sciatica present - Plan: DG Lumbar Spine Complete, cyclobenzaprine (FLEXERIL) 10 MG tablet   Cervicalgia - Plan: DG Cervical Spine Complete, cyclobenzaprine (FLEXERIL) 10 MG tablet F/u Dr. Max Fickle chiropractor [place]    HM Flu shot utd Tdap utd 10/20/19  shingrix 2/2  prevnar utd 06/23/20  pna 23 utd will do 06/2021 with labs [place] 4/4 consider booster    Hep C negative 06/23/20 Pap 03/28/17 negative consider in future h/o abnormal pap  PF[facility] Dr. Lynnette Caffey    Mammogram 12/09/19 negative  --sch 12/2020 will go to [facility]   dexa +osteoporsis dexa 08/24/19  Prolia 10/2020 Dr. Ronnald Collum for thyroid and osteoporosis     Colonoscopy 01/21/18 tubular polyps x 5 repeat in 2024   H/o thyroid nodule 5.2 x 1.8 x 1.9 cm right lobe thyroid  H/o osteoporosis T score -2.9, -2.2, -2.3  H/o lung nodules Stress echo had 06/26/16   Results   Global LVEF (rest): Normal (LVEF >50%)   Global LVEF (stress): Hyperkinetic (LVEF >70%)   ECG   Normal sinus  rhythm. Normal tracing   Arrhythmias   No rhythm abnormality.   Symptoms   No cardiovascular symptoms with maximal exercise.    TVUS/US 08/08/16 2.5 cm simple cyst left ovary      CT 09/25/2018 CT lung screen  IMPRESSION:  1. Lung-RADS 2, benign appearance or behavior. Continue annual  screening with low-dose chest CT without contrast in 12 months.  2.  Emphysema. (ICD10-J43.9)  3.  Aortic Atherosclerois (ICD10-170.0)    Electronically Signed    By: Misty Stanley M.D.    On: 09/26/2018 08:23 CT chest 01/07/20  IMPRESSION:  1. 1. Lung-RADS 2S, benign appearance or behavior. Continue annual  screening with low-dose chest CT without contrast in 12 months.  2. The "S" modifier above refers to potentially clinically  significant non lung cancer related findings. Specifically, there is  aortic atherosclerosis, in addition to 2 vessel coronary artery  disease. Please note that although the presence of coronary artery  calcium documents the presence of coronary artery disease, the  severity of this disease and any potential stenosis cannot be  assessed on this non-gated CT examination. Assessment for potential  risk factor modification, dietary therapy or pharmacologic therapy  may be warranted, if clinically indicated.  3. Mild diffuse bronchial wall thickening with mild centrilobular  and paraseptal emphysema; imaging findings suggestive of underlying  COPD.   Aortic Atherosclerosis (ICD10-I70.0) and Emphysema (ICD10-J43.9).    Provider: Dr. Olivia Mackie McLean-Scocuzza-Internal Medicine

## 2021-01-06 ENCOUNTER — Telehealth: Payer: Self-pay | Admitting: Internal Medicine

## 2021-01-06 NOTE — Telephone Encounter (Signed)
Patient advised that had xray on yesterday morning and would like xray  imaging on disc and report with it. Please call patient  at [phone] Patient  advised need everything by Monday morning

## 2021-01-09 NOTE — Telephone Encounter (Signed)
Patient came and picked up disk

## 2021-01-09 NOTE — Telephone Encounter (Signed)
See result note.  

## 2021-01-09 NOTE — Telephone Encounter (Signed)
Can this be placed on a disk for the Patient?

## 2021-01-19 DIAGNOSIS — Z87891 Personal history of nicotine dependence: Secondary | ICD-10-CM | POA: Diagnosis not present

## 2021-01-23 DIAGNOSIS — M5451 Vertebrogenic low back pain: Secondary | ICD-10-CM | POA: Diagnosis not present

## 2021-01-23 DIAGNOSIS — M9904 Segmental and somatic dysfunction of sacral region: Secondary | ICD-10-CM | POA: Diagnosis not present

## 2021-01-23 DIAGNOSIS — M546 Pain in thoracic spine: Secondary | ICD-10-CM | POA: Diagnosis not present

## 2021-01-23 DIAGNOSIS — M461 Sacroiliitis, not elsewhere classified: Secondary | ICD-10-CM | POA: Diagnosis not present

## 2021-01-23 DIAGNOSIS — M9902 Segmental and somatic dysfunction of thoracic region: Secondary | ICD-10-CM | POA: Diagnosis not present

## 2021-01-23 DIAGNOSIS — M9903 Segmental and somatic dysfunction of lumbar region: Secondary | ICD-10-CM | POA: Diagnosis not present

## 2021-01-24 DIAGNOSIS — M810 Age-related osteoporosis without current pathological fracture: Secondary | ICD-10-CM | POA: Diagnosis not present

## 2021-01-24 DIAGNOSIS — E063 Autoimmune thyroiditis: Secondary | ICD-10-CM | POA: Diagnosis not present

## 2021-01-24 DIAGNOSIS — E785 Hyperlipidemia, unspecified: Secondary | ICD-10-CM | POA: Diagnosis not present

## 2021-01-24 DIAGNOSIS — E042 Nontoxic multinodular goiter: Secondary | ICD-10-CM | POA: Diagnosis not present

## 2021-01-24 DIAGNOSIS — E039 Hypothyroidism, unspecified: Secondary | ICD-10-CM | POA: Diagnosis not present

## 2021-01-25 DIAGNOSIS — M9902 Segmental and somatic dysfunction of thoracic region: Secondary | ICD-10-CM | POA: Diagnosis not present

## 2021-01-25 DIAGNOSIS — M9903 Segmental and somatic dysfunction of lumbar region: Secondary | ICD-10-CM | POA: Diagnosis not present

## 2021-01-25 DIAGNOSIS — M546 Pain in thoracic spine: Secondary | ICD-10-CM | POA: Diagnosis not present

## 2021-01-25 DIAGNOSIS — M5451 Vertebrogenic low back pain: Secondary | ICD-10-CM | POA: Diagnosis not present

## 2021-01-25 DIAGNOSIS — M9904 Segmental and somatic dysfunction of sacral region: Secondary | ICD-10-CM | POA: Diagnosis not present

## 2021-01-27 DIAGNOSIS — M5451 Vertebrogenic low back pain: Secondary | ICD-10-CM | POA: Diagnosis not present

## 2021-01-27 DIAGNOSIS — M9903 Segmental and somatic dysfunction of lumbar region: Secondary | ICD-10-CM | POA: Diagnosis not present

## 2021-01-27 DIAGNOSIS — M546 Pain in thoracic spine: Secondary | ICD-10-CM | POA: Diagnosis not present

## 2021-01-27 DIAGNOSIS — M9904 Segmental and somatic dysfunction of sacral region: Secondary | ICD-10-CM | POA: Diagnosis not present

## 2021-01-27 DIAGNOSIS — M9902 Segmental and somatic dysfunction of thoracic region: Secondary | ICD-10-CM | POA: Diagnosis not present

## 2021-01-30 DIAGNOSIS — M5451 Vertebrogenic low back pain: Secondary | ICD-10-CM | POA: Diagnosis not present

## 2021-01-30 DIAGNOSIS — M9903 Segmental and somatic dysfunction of lumbar region: Secondary | ICD-10-CM | POA: Diagnosis not present

## 2021-01-30 DIAGNOSIS — M9904 Segmental and somatic dysfunction of sacral region: Secondary | ICD-10-CM | POA: Diagnosis not present

## 2021-01-30 DIAGNOSIS — M546 Pain in thoracic spine: Secondary | ICD-10-CM | POA: Diagnosis not present

## 2021-01-30 DIAGNOSIS — M9902 Segmental and somatic dysfunction of thoracic region: Secondary | ICD-10-CM | POA: Diagnosis not present

## 2021-01-31 DIAGNOSIS — E042 Nontoxic multinodular goiter: Secondary | ICD-10-CM | POA: Diagnosis not present

## 2021-01-31 DIAGNOSIS — J449 Chronic obstructive pulmonary disease, unspecified: Secondary | ICD-10-CM | POA: Diagnosis not present

## 2021-01-31 DIAGNOSIS — E063 Autoimmune thyroiditis: Secondary | ICD-10-CM | POA: Diagnosis not present

## 2021-01-31 DIAGNOSIS — M810 Age-related osteoporosis without current pathological fracture: Secondary | ICD-10-CM | POA: Diagnosis not present

## 2021-01-31 DIAGNOSIS — E039 Hypothyroidism, unspecified: Secondary | ICD-10-CM | POA: Diagnosis not present

## 2021-02-01 DIAGNOSIS — M9902 Segmental and somatic dysfunction of thoracic region: Secondary | ICD-10-CM | POA: Diagnosis not present

## 2021-02-01 DIAGNOSIS — M546 Pain in thoracic spine: Secondary | ICD-10-CM | POA: Diagnosis not present

## 2021-02-01 DIAGNOSIS — M9904 Segmental and somatic dysfunction of sacral region: Secondary | ICD-10-CM | POA: Diagnosis not present

## 2021-02-01 DIAGNOSIS — M9903 Segmental and somatic dysfunction of lumbar region: Secondary | ICD-10-CM | POA: Diagnosis not present

## 2021-02-01 DIAGNOSIS — M5451 Vertebrogenic low back pain: Secondary | ICD-10-CM | POA: Diagnosis not present

## 2021-02-27 DIAGNOSIS — M5451 Vertebrogenic low back pain: Secondary | ICD-10-CM | POA: Diagnosis not present

## 2021-02-27 DIAGNOSIS — M9904 Segmental and somatic dysfunction of sacral region: Secondary | ICD-10-CM | POA: Diagnosis not present

## 2021-02-27 DIAGNOSIS — M9902 Segmental and somatic dysfunction of thoracic region: Secondary | ICD-10-CM | POA: Diagnosis not present

## 2021-02-27 DIAGNOSIS — M9903 Segmental and somatic dysfunction of lumbar region: Secondary | ICD-10-CM | POA: Diagnosis not present

## 2021-02-27 DIAGNOSIS — M546 Pain in thoracic spine: Secondary | ICD-10-CM | POA: Diagnosis not present

## 2021-03-31 ENCOUNTER — Encounter: Payer: Self-pay | Admitting: Internal Medicine

## 2021-03-31 ENCOUNTER — Ambulatory Visit (INDEPENDENT_AMBULATORY_CARE_PROVIDER_SITE_OTHER): Payer: Medicare Other | Admitting: Internal Medicine

## 2021-03-31 ENCOUNTER — Other Ambulatory Visit: Payer: Self-pay

## 2021-03-31 VITALS — BP 118/76 | HR 65 | Temp 97.9°F | Resp 16 | Ht 63.0 in | Wt 138.1 lb

## 2021-03-31 DIAGNOSIS — R52 Pain, unspecified: Secondary | ICD-10-CM | POA: Diagnosis not present

## 2021-03-31 DIAGNOSIS — J029 Acute pharyngitis, unspecified: Secondary | ICD-10-CM | POA: Diagnosis not present

## 2021-03-31 DIAGNOSIS — J321 Chronic frontal sinusitis: Secondary | ICD-10-CM | POA: Diagnosis not present

## 2021-03-31 LAB — POCT RAPID STREP A (OFFICE): Rapid Strep A Screen: NEGATIVE

## 2021-03-31 LAB — POCT INFLUENZA A/B
Influenza A, POC: NEGATIVE
Influenza B, POC: NEGATIVE

## 2021-03-31 NOTE — Progress Notes (Signed)
Chief Complaint  Patient presents with   Headache   Cough   Sinusitis   URI   F/u  Sinus pressure and pain frontal started Friday/sat worse Sunday worse achy, runny nose subjective fever felt hot tried prn zyrtec, nyquil no cough  has yellow nasal discharge and h/a and sinus congestion    Review of Systems  Constitutional:  Negative for weight loss.  HENT:  Negative for hearing loss.   Eyes:  Negative for blurred vision.  Respiratory:  Negative for cough and shortness of breath.   Cardiovascular:  Negative for chest pain.  Gastrointestinal:  Negative for abdominal pain and blood in stool.  Genitourinary:  Negative for dysuria.  Musculoskeletal:  Negative for falls and joint pain.  Skin:  Negative for rash.  Neurological:  Positive for headaches.  Psychiatric/Behavioral:  Negative for depression.   Past Medical History:  Diagnosis Date   ANEMIA-NOS 09/28/2009   Aortic atherosclerosis ([place])    ASTHMA 09/28/2009   moderate persistent    CAD (coronary artery disease)    Colon polyps    COPD (chronic obstructive pulmonary disease) ([place])    COVID-19    01/20/2020   Emphysema lung (HCC)    vs bronchitis;paraseptal    Herpes    HPV in female    hpv 20+ years    Hyperlipemia    HYPOTHYROIDISM 09/28/2009   Lung nodule    Osteoporosis    Pneumonia    hosp in 2018   Thyroid nodule    right thyroid 5.2 x 1.8 x 1.9 cm right lobe   Past Surgical History:  Procedure Laterality Date   COLONOSCOPY     Family History  Problem Relation Age of Onset   Cancer Mother    Asthma Father    Cancer Father    Hyperlipidemia Father    Hyperlipidemia Brother    Social History   Socioeconomic History   Marital status: Married    Spouse name: Not on file   Number of children: Not on file   Years of education: Not on file   Highest education level: Not on file  Occupational History   Not on file  Tobacco Use   Smoking status: Former    Packs/day: 1.00    Types: Cigarettes    Quit  date: 01/26/2013    Years since quitting: 8.1   Smokeless tobacco: Never  Substance and Sexual Activity   Alcohol use: Yes    Alcohol/week: 3.0 standard drinks    Types: 3 Standard drinks or equivalent per week   Drug use: No   Sexual activity: Not on file  Other Topics Concern   Not on file  Social History Narrative   Lives alone with dog    Moved from [place]    Bachelors degree massage therapy    Age 21 y.o abortion    lmp age 57    Social Determinants of Health   Financial Resource Strain: Not on file  Food Insecurity: Not on file  Transportation Needs: Not on file  Physical Activity: Not on file  Stress: Not on file  Social Connections: Not on file  Intimate Partner Violence: Not on file   Current Meds  Medication Sig   ascorbic acid (VITAMIN C) 1000 MG tablet Take by mouth.   atorvastatin (LIPITOR) 10 MG tablet Take 1 tablet (10 mg total) by mouth daily.   budesonide-formoterol (SYMBICORT) 80-4.5 MCG/ACT inhaler Inhale into the lungs.   calcium citrate-vitamin D (CITRACAL+D) 315-200 MG-UNIT  tablet Take 2 tablets by mouth daily.   cholecalciferol (VITAMIN D) 25 MCG (1000 UNIT) tablet Take 2,000 Units by mouth daily.   clonazePAM (KLONOPIN) 0.5 MG tablet Take 0.25 mg by mouth at bedtime. Outside prescriber holistic MD Dr. Cherre Blanc   cyclobenzaprine (FLEXERIL) 10 MG tablet Take 1 tablet (10 mg total) by mouth at bedtime as needed for muscle spasms.   denosumab (PROLIA) 60 MG/ML SOSY injection Inject 60 mg into the skin every 6 (six) months.   erythromycin ophthalmic ointment Place 1 application into the right eye 3 (three) times daily. X 4-7 days   hydrochlorothiazide (HYDRODIURIL) 25 MG tablet Take 0.5 tablets (12.5 mg total) by mouth daily.   HYDROcodone-acetaminophen (NORCO) 7.5-325 MG tablet Take by mouth. From holistic MD Dr. Sheppard Coil Augustides   hydrOXYzine (VISTARIL) 25 MG capsule Take 50 mg by mouth at bedtime.   ipratropium (ATROVENT) 0.02 % nebulizer  solution Take 500 mcg by nebulization 4 (four) times daily.   Ipratropium-Albuterol (COMBIVENT RESPIMAT) 20-100 MCG/ACT AERS respimat    Ipratropium-Albuterol (COMBIVENT RESPIMAT) 20-100 MCG/ACT AERS respimat Inhale 1 puff into the lungs every 6 (six) hours as needed for wheezing.   progesterone (PROMETRIUM) 100 MG capsule Take 200 mg by mouth daily.   theophylline (THEODUR) 300 MG 12 hr tablet Take by mouth in the morning and at bedtime. Whole pill in the morning, half pill at night   thyroid (ARMOUR) 15 MG tablet 45 mg M-F, 30 mg Sat/Sunday   valACYclovir (VALTREX) 1000 MG tablet 2 pills x 1 dose then X bid 3-7 days with outbreak until resolution   Allergies  Allergen Reactions   Cat Hair Extract    Dust Mite Extract    Sulfonamide Derivatives    Recent Results (from the past 2160 hour(s))  POCT rapid strep A     Status: None   Collection Time: 03/31/21 11:48 AM  Result Value Ref Range   Rapid Strep A Screen Negative Negative  POCT Influenza A/B     Status: None   Collection Time: 03/31/21 11:48 AM  Result Value Ref Range   Influenza A, POC Negative Negative   Influenza B, POC Negative Negative   Objective  Body mass index is 24.47 kg/m. Wt Readings from Last 3 Encounters:  03/31/21 138 lb 2 oz (62.7 kg)  01/05/21 134 lb 3.2 oz (60.9 kg)  12/01/20 130 lb 6.4 oz (59.1 kg)   Temp Readings from Last 3 Encounters:  03/31/21 97.9 F (36.6 C) (Oral)  01/05/21 (!) 97 F (36.1 C) (Temporal)  12/01/20 (!) 97.1 F (36.2 C) (Temporal)   BP Readings from Last 3 Encounters:  03/31/21 118/76  01/05/21 110/78  12/01/20 100/66   Pulse Readings from Last 3 Encounters:  03/31/21 65  01/05/21 72  12/01/20 64    Physical Exam Vitals and nursing note reviewed.  Constitutional:      Appearance: Normal appearance. She is well-developed and well-groomed.  HENT:     Head: Normocephalic and atraumatic.     Ears:     Comments: Right frontal eye pain Eyes:     Conjunctiva/sclera:  Conjunctivae normal.     Pupils: Pupils are equal, round, and reactive to light.  Cardiovascular:     Rate and Rhythm: Normal rate and regular rhythm.     Heart sounds: Normal heart sounds. No murmur heard. Pulmonary:     Effort: Pulmonary effort is normal.     Breath sounds: Normal breath sounds.  Abdominal:  General: Abdomen is flat. Bowel sounds are normal.     Tenderness: There is no abdominal tenderness.  Musculoskeletal:        General: No tenderness.  Skin:    General: Skin is warm and dry.  Neurological:     General: No focal deficit present.     Mental Status: She is alert and oriented to person, place, and time. Mental status is at baseline.     Cranial Nerves: Cranial nerves 2-12 are intact.     Motor: Motor function is intact.     Coordination: Coordination is intact.     Gait: Gait is intact.  Psychiatric:        Attention and Perception: Attention and perception normal.        Mood and Affect: Mood and affect normal.        Speech: Speech normal.        Behavior: Behavior normal. Behavior is cooperative.        Thought Content: Thought content normal.        Cognition and Memory: Cognition and memory normal.        Judgment: Judgment normal.    Assessment  Plan  Frontal sinusitis, unspecified chronicity  Body aches - Plan: POCT Influenza A/B, COVID-19, Flu A+B and RSV  Sore throat - Plan: POCT rapid strep A  Flu and strep neg  Declines Abx for now  Prn zyrtec,  nyquil  Prn sudafed x 3 years   If needing prescription strength medication we will need to make an appointment with a provider.  These are over the counter medication options:  Mucinex dm green label for cough or robitussin DM  Multivitamin or below vitamins  Vitamin C 1000 mg daily.  Vitamin D3 4000 Iu (units) daily.  Zinc 100 mg daily.  Quercetin 250-500 mg 2 times per day   Elderberry  Oil of oregano  cepacol or chloroseptic spray Warm salt water gargles +hydrogen peroxide Sugar free  cough drops  Warm tea with honey and lemon  Hydration  Try to eat though you dont feel like it   Tylenol or Advil  Nasal saline and Flonase 2 sprays nasal congestion  If sneezing/runny nose over the counter allergy pill claritin,allegra, zyrtec, xyzal Quarantine x 10-14 days 14 days preferred   Monitor pulse oximeter, buy from [place] if oxygen is less than 90 please go to the hospital.        Are you feeling really sick? Shortness of breath, cough, chest pain?, dizziness? Confusion   If so let me know  If worsening, go to hospital or [facility] Urgent care for further treatment.    Provider: Dr. Olivia Mackie McLean-Scocuzza-Internal Medicine

## 2021-03-31 NOTE — Patient Instructions (Addendum)
[facility]  ?Molnupiravir cousin of paxlovid use for covid 30 with +covid test  ? ?Nasal saline 2 sprays each followed by flonase  ?Continue zyrtec  ? ?Mucinex dm green label for cough or robitussin DM - for cough  ? ?Multivitamin or below vitamins  ?Vitamin C 1000 mg daily.  ?Vitamin D3 4000 Iu (units) daily.  ?Zinc 100 mg daily.  ? ?Quercetin 250-500 mg 2 times per day   ?Elderberry  ?Oil of oregano -immune  ? ?Cepacol lozenges or chloroseptic spray-sore throat  ? ?Warm salt water gargles +hydrogen peroxide-sore  ? ?Sugar free cough drops honey and lemon ? ?Warm tea with manuka honey and lemon  ? ?Hydration  ? ?Try to eat though you dont feel like it   ? ?Tylenol as needed  ? ?

## 2021-04-02 LAB — COVID-19, FLU A+B AND RSV
Influenza A, NAA: NOT DETECTED
Influenza B, NAA: NOT DETECTED
RSV, NAA: NOT DETECTED
SARS-CoV-2, NAA: NOT DETECTED

## 2021-04-27 DIAGNOSIS — M810 Age-related osteoporosis without current pathological fracture: Secondary | ICD-10-CM | POA: Diagnosis not present

## 2021-04-27 DIAGNOSIS — J441 Chronic obstructive pulmonary disease with (acute) exacerbation: Secondary | ICD-10-CM | POA: Diagnosis not present

## 2021-04-27 DIAGNOSIS — R7309 Other abnormal glucose: Secondary | ICD-10-CM | POA: Diagnosis not present

## 2021-04-27 DIAGNOSIS — E782 Mixed hyperlipidemia: Secondary | ICD-10-CM | POA: Diagnosis not present

## 2021-04-27 DIAGNOSIS — E039 Hypothyroidism, unspecified: Secondary | ICD-10-CM | POA: Diagnosis not present

## 2021-05-02 DIAGNOSIS — R7989 Other specified abnormal findings of blood chemistry: Secondary | ICD-10-CM | POA: Diagnosis not present

## 2021-05-02 DIAGNOSIS — I7090 Unspecified atherosclerosis: Secondary | ICD-10-CM | POA: Diagnosis not present

## 2021-05-10 DIAGNOSIS — E042 Nontoxic multinodular goiter: Secondary | ICD-10-CM | POA: Diagnosis not present

## 2021-05-10 DIAGNOSIS — E039 Hypothyroidism, unspecified: Secondary | ICD-10-CM | POA: Diagnosis not present

## 2021-05-10 DIAGNOSIS — M810 Age-related osteoporosis without current pathological fracture: Secondary | ICD-10-CM | POA: Diagnosis not present

## 2021-05-10 DIAGNOSIS — J449 Chronic obstructive pulmonary disease, unspecified: Secondary | ICD-10-CM | POA: Diagnosis not present

## 2021-05-11 ENCOUNTER — Other Ambulatory Visit: Payer: Self-pay | Admitting: Family Medicine

## 2021-05-11 DIAGNOSIS — E049 Nontoxic goiter, unspecified: Secondary | ICD-10-CM

## 2021-05-11 DIAGNOSIS — R7989 Other specified abnormal findings of blood chemistry: Secondary | ICD-10-CM

## 2021-05-15 ENCOUNTER — Telehealth: Payer: Self-pay | Admitting: Internal Medicine

## 2021-05-15 NOTE — Addendum Note (Signed)
Addended by: Orland Mustard on: 05/15/2021 09:57 AM ? ? Modules accepted: Orders ? ?

## 2021-05-15 NOTE — Telephone Encounter (Signed)
Pt would like to get a referral for ENT for her throat. She thinks its something down there ?

## 2021-05-18 ENCOUNTER — Ambulatory Visit
Admission: RE | Admit: 2021-05-18 | Discharge: 2021-05-18 | Disposition: A | Discharge status: Home / Self Care | Payer: Medicare Other | Source: Ambulatory Visit | Attending: Family Medicine | Admitting: Family Medicine

## 2021-05-18 DIAGNOSIS — R7989 Other specified abnormal findings of blood chemistry: Secondary | ICD-10-CM

## 2021-05-18 DIAGNOSIS — E049 Nontoxic goiter, unspecified: Secondary | ICD-10-CM | POA: Insufficient documentation

## 2021-05-18 DIAGNOSIS — R945 Abnormal results of liver function studies: Secondary | ICD-10-CM | POA: Diagnosis not present

## 2021-05-26 DIAGNOSIS — E782 Mixed hyperlipidemia: Secondary | ICD-10-CM | POA: Diagnosis not present

## 2021-05-26 DIAGNOSIS — R7989 Other specified abnormal findings of blood chemistry: Secondary | ICD-10-CM | POA: Diagnosis not present

## 2021-05-26 DIAGNOSIS — R7309 Other abnormal glucose: Secondary | ICD-10-CM | POA: Diagnosis not present

## 2021-05-26 DIAGNOSIS — E049 Nontoxic goiter, unspecified: Secondary | ICD-10-CM | POA: Diagnosis not present

## 2021-05-26 DIAGNOSIS — R945 Abnormal results of liver function studies: Secondary | ICD-10-CM | POA: Diagnosis not present

## 2021-05-26 DIAGNOSIS — E039 Hypothyroidism, unspecified: Secondary | ICD-10-CM | POA: Diagnosis not present

## 2021-05-26 DIAGNOSIS — D509 Iron deficiency anemia, unspecified: Secondary | ICD-10-CM | POA: Diagnosis not present

## 2021-05-26 DIAGNOSIS — R5381 Other malaise: Secondary | ICD-10-CM | POA: Diagnosis not present

## 2021-05-26 DIAGNOSIS — E063 Autoimmune thyroiditis: Secondary | ICD-10-CM | POA: Diagnosis not present

## 2021-06-02 DIAGNOSIS — M9903 Segmental and somatic dysfunction of lumbar region: Secondary | ICD-10-CM | POA: Diagnosis not present

## 2021-06-02 DIAGNOSIS — M461 Sacroiliitis, not elsewhere classified: Secondary | ICD-10-CM | POA: Diagnosis not present

## 2021-06-02 DIAGNOSIS — M5451 Vertebrogenic low back pain: Secondary | ICD-10-CM | POA: Diagnosis not present

## 2021-06-02 DIAGNOSIS — M9902 Segmental and somatic dysfunction of thoracic region: Secondary | ICD-10-CM | POA: Diagnosis not present

## 2021-06-02 DIAGNOSIS — M546 Pain in thoracic spine: Secondary | ICD-10-CM | POA: Diagnosis not present

## 2021-06-02 DIAGNOSIS — M9904 Segmental and somatic dysfunction of sacral region: Secondary | ICD-10-CM | POA: Diagnosis not present

## 2021-06-21 DIAGNOSIS — M9904 Segmental and somatic dysfunction of sacral region: Secondary | ICD-10-CM | POA: Diagnosis not present

## 2021-06-21 DIAGNOSIS — M9903 Segmental and somatic dysfunction of lumbar region: Secondary | ICD-10-CM | POA: Diagnosis not present

## 2021-06-21 DIAGNOSIS — M9902 Segmental and somatic dysfunction of thoracic region: Secondary | ICD-10-CM | POA: Diagnosis not present

## 2021-06-21 DIAGNOSIS — M546 Pain in thoracic spine: Secondary | ICD-10-CM | POA: Diagnosis not present

## 2021-06-21 DIAGNOSIS — M5451 Vertebrogenic low back pain: Secondary | ICD-10-CM | POA: Diagnosis not present

## 2021-06-23 DIAGNOSIS — K219 Gastro-esophageal reflux disease without esophagitis: Secondary | ICD-10-CM | POA: Diagnosis not present

## 2021-06-23 DIAGNOSIS — R1314 Dysphagia, pharyngoesophageal phase: Secondary | ICD-10-CM | POA: Diagnosis not present

## 2021-07-06 ENCOUNTER — Ambulatory Visit: Payer: Medicare Other | Admitting: Internal Medicine

## 2021-07-17 DIAGNOSIS — M461 Sacroiliitis, not elsewhere classified: Secondary | ICD-10-CM | POA: Diagnosis not present

## 2021-07-17 DIAGNOSIS — M5451 Vertebrogenic low back pain: Secondary | ICD-10-CM | POA: Diagnosis not present

## 2021-07-17 DIAGNOSIS — M9904 Segmental and somatic dysfunction of sacral region: Secondary | ICD-10-CM | POA: Diagnosis not present

## 2021-07-17 DIAGNOSIS — M546 Pain in thoracic spine: Secondary | ICD-10-CM | POA: Diagnosis not present

## 2021-07-17 DIAGNOSIS — M9902 Segmental and somatic dysfunction of thoracic region: Secondary | ICD-10-CM | POA: Diagnosis not present

## 2021-07-17 DIAGNOSIS — M9903 Segmental and somatic dysfunction of lumbar region: Secondary | ICD-10-CM | POA: Diagnosis not present

## 2021-07-19 ENCOUNTER — Telehealth: Payer: Self-pay

## 2021-07-19 DIAGNOSIS — Z03818 Encounter for observation for suspected exposure to other biological agents ruled out: Secondary | ICD-10-CM | POA: Diagnosis not present

## 2021-07-19 DIAGNOSIS — R197 Diarrhea, unspecified: Secondary | ICD-10-CM | POA: Diagnosis not present

## 2021-07-19 DIAGNOSIS — R059 Cough, unspecified: Secondary | ICD-10-CM | POA: Diagnosis not present

## 2021-07-19 NOTE — Telephone Encounter (Signed)
Patient states she has a cough, which started yesterday, and diarrhea, which started over a week ago.  Patient states Dr. Olivia Mackie McLean-Scocuzza told her to call if she feels like she needs a covid test, and patient states she feels like she needs to have a covid test.  Patient states she would like to have it today.  Patient states she took an RV trip across the country and flew back, arriving home 07/13/2021.  Patient states she took a covid test when she arrived home, and it was negative.

## 2021-07-31 DIAGNOSIS — E039 Hypothyroidism, unspecified: Secondary | ICD-10-CM | POA: Diagnosis not present

## 2021-07-31 DIAGNOSIS — E042 Nontoxic multinodular goiter: Secondary | ICD-10-CM | POA: Diagnosis not present

## 2021-07-31 DIAGNOSIS — M810 Age-related osteoporosis without current pathological fracture: Secondary | ICD-10-CM | POA: Diagnosis not present

## 2021-07-31 DIAGNOSIS — E785 Hyperlipidemia, unspecified: Secondary | ICD-10-CM | POA: Diagnosis not present

## 2021-07-31 DIAGNOSIS — E063 Autoimmune thyroiditis: Secondary | ICD-10-CM | POA: Diagnosis not present

## 2021-08-10 ENCOUNTER — Ambulatory Visit (INDEPENDENT_AMBULATORY_CARE_PROVIDER_SITE_OTHER): Payer: Medicare Other | Admitting: Internal Medicine

## 2021-08-10 ENCOUNTER — Encounter: Payer: Self-pay | Admitting: Internal Medicine

## 2021-08-10 ENCOUNTER — Ambulatory Visit: Payer: Medicare Other

## 2021-08-10 VITALS — BP 116/62 | HR 65 | Temp 97.9°F | Ht 63.0 in | Wt 136.4 lb

## 2021-08-10 DIAGNOSIS — Z23 Encounter for immunization: Secondary | ICD-10-CM

## 2021-08-10 DIAGNOSIS — J449 Chronic obstructive pulmonary disease, unspecified: Secondary | ICD-10-CM

## 2021-08-10 DIAGNOSIS — L709 Acne, unspecified: Secondary | ICD-10-CM | POA: Diagnosis not present

## 2021-08-10 DIAGNOSIS — R918 Other nonspecific abnormal finding of lung field: Secondary | ICD-10-CM

## 2021-08-10 DIAGNOSIS — R197 Diarrhea, unspecified: Secondary | ICD-10-CM | POA: Diagnosis not present

## 2021-08-10 DIAGNOSIS — J4520 Mild intermittent asthma, uncomplicated: Secondary | ICD-10-CM | POA: Diagnosis not present

## 2021-08-10 DIAGNOSIS — Z1283 Encounter for screening for malignant neoplasm of skin: Secondary | ICD-10-CM | POA: Diagnosis not present

## 2021-08-10 NOTE — Progress Notes (Signed)
Chief Complaint  Patient presents with   Follow-up    6 month f/u with concerns about diarrhea ( for over a month0 She experiences bloating and gas no abdominal pain. She also wants to discuss a sore that's in her mouth    F/u  1. Diarrhea x 1 month after RV road trip up to 10 x per day with bloating &gas tried immodium, peptobismol w/o relief has dog no one else sick friend has ibs  2. Canker sore left lower gumline  3. Asthma/copd/lung nodules 4. Cystic acne and tbse wants referral dermatology tried minocycline in the past     Review of Systems  Constitutional:  Negative for weight loss.  HENT:  Negative for hearing loss.   Eyes:  Negative for blurred vision.  Respiratory:  Negative for shortness of breath.   Cardiovascular:  Negative for chest pain.  Gastrointestinal:  Positive for diarrhea. Negative for abdominal pain and blood in stool.  Genitourinary:  Negative for dysuria.  Musculoskeletal:  Negative for falls and joint pain.  Skin:  Positive for rash.  Neurological:  Negative for headaches.  Psychiatric/Behavioral:  Negative for depression.    Past Medical History:  Diagnosis Date   ANEMIA-NOS 09/28/2009   Aortic atherosclerosis ([place])    ASTHMA 09/28/2009   moderate persistent    CAD (coronary artery disease)    Colon polyps    COPD (chronic obstructive pulmonary disease) ([place])    COVID-19    01/20/2020   Emphysema lung ([facility])    vs bronchitis;paraseptal    Herpes    HPV in female    hpv 20+ years    Hyperlipemia    HYPOTHYROIDISM 09/28/2009   Lung nodule    Osteoporosis    Pneumonia    hosp in 2018   Thyroid nodule    right thyroid 5.2 x 1.8 x 1.9 cm right lobe   Past Surgical History:  Procedure Laterality Date   COLONOSCOPY     Family History  Problem Relation Age of Onset   Cancer Mother    Asthma Father    Cancer Father    Hyperlipidemia Father    Hyperlipidemia Brother    Social History   Socioeconomic History   Marital status: Married     Spouse name: Not on file   Number of children: Not on file   Years of education: Not on file   Highest education level: Not on file  Occupational History   Not on file  Tobacco Use   Smoking status: Former    Packs/day: 1.00    Types: Cigarettes    Quit date: 01/26/2013    Years since quitting: 8.5   Smokeless tobacco: Never  Substance and Sexual Activity   Alcohol use: Yes    Alcohol/week: 3.0 standard drinks of alcohol    Types: 3 Standard drinks or equivalent per week   Drug use: No   Sexual activity: Not on file  Other Topics Concern   Not on file  Social History Narrative   Lives alone with dog    Moved from [place]    Bachelors degree massage therapy    Age 89 y.o abortion    lmp age 46    Social Determinants of Health   Financial Resource Strain: Not on file  Food Insecurity: Not on file  Transportation Needs: Not on file  Physical Activity: Not on file  Stress: Not on file  Social Connections: Not on file  Intimate Partner Violence: Not on file  Current Meds  Medication Sig   ascorbic acid (VITAMIN C) 1000 MG tablet Take by mouth.   atorvastatin (LIPITOR) 10 MG tablet Take 1 tablet (10 mg total) by mouth daily.   budesonide-formoterol (SYMBICORT) 80-4.5 MCG/ACT inhaler Inhale into the lungs.   calcium citrate-vitamin D (CITRACAL+D) 315-200 MG-UNIT tablet Take 2 tablets by mouth daily.   cholecalciferol (VITAMIN D) 25 MCG (1000 UNIT) tablet Take 2,000 Units by mouth daily.   clonazePAM (KLONOPIN) 0.5 MG tablet Take 0.25 mg by mouth at bedtime. Outside prescriber holistic MD Dr. Cherre Blanc   cyclobenzaprine (FLEXERIL) 10 MG tablet Take 1 tablet (10 mg total) by mouth at bedtime as needed for muscle spasms.   denosumab (PROLIA) 60 MG/ML SOSY injection Inject 60 mg into the skin every 6 (six) months.   hydrochlorothiazide (HYDRODIURIL) 25 MG tablet Take 0.5 tablets (12.5 mg total) by mouth daily.   HYDROcodone-acetaminophen (NORCO) 7.5-325 MG tablet Take  by mouth. From holistic MD Dr. Sheppard Coil Augustides   ipratropium (ATROVENT) 0.02 % nebulizer solution Take 500 mcg by nebulization 4 (four) times daily.   Ipratropium-Albuterol (COMBIVENT RESPIMAT) 20-100 MCG/ACT AERS respimat    Ipratropium-Albuterol (COMBIVENT RESPIMAT) 20-100 MCG/ACT AERS respimat Inhale 1 puff into the lungs every 6 (six) hours as needed for wheezing.   progesterone (PROMETRIUM) 100 MG capsule Take 200 mg by mouth daily.   theophylline (THEODUR) 300 MG 12 hr tablet Take by mouth in the morning and at bedtime. Whole pill in the morning, half pill at night   thyroid (ARMOUR) 15 MG tablet 45 mg M-F, 30 mg Sat/Sunday   valACYclovir (VALTREX) 1000 MG tablet 2 pills x 1 dose then X bid 3-7 days with outbreak until resolution   Allergies  Allergen Reactions   Cat Hair Extract    Dust Mite Extract    Sulfonamide Derivatives    No results found for this or any previous visit (from the past 2160 hour(s)). Objective  Body mass index is 24.16 kg/m. Wt Readings from Last 3 Encounters:  08/10/21 136 lb 6.4 oz (61.9 kg)  03/31/21 138 lb 2 oz (62.7 kg)  01/05/21 134 lb 3.2 oz (60.9 kg)   Temp Readings from Last 3 Encounters:  08/10/21 97.9 F (36.6 C) (Oral)  03/31/21 97.9 F (36.6 C) (Oral)  01/05/21 (!) 97 F (36.1 C) (Temporal)   BP Readings from Last 3 Encounters:  08/10/21 116/62  03/31/21 118/76  01/05/21 110/78   Pulse Readings from Last 3 Encounters:  08/10/21 65  03/31/21 65  01/05/21 72    Physical Exam Vitals and nursing note reviewed.  Constitutional:      Appearance: Normal appearance. She is well-developed and well-groomed.  HENT:     Head: Normocephalic and atraumatic.  Eyes:     Conjunctiva/sclera: Conjunctivae normal.     Pupils: Pupils are equal, round, and reactive to light.  Cardiovascular:     Rate and Rhythm: Normal rate and regular rhythm.     Heart sounds: Normal heart sounds. No murmur heard. Pulmonary:     Effort: Pulmonary  effort is normal.     Breath sounds: Normal breath sounds.  Abdominal:     General: Abdomen is flat. Bowel sounds are normal.     Tenderness: There is no abdominal tenderness.     Comments: +bloating  Musculoskeletal:        General: No tenderness.  Skin:    General: Skin is warm and dry.  Neurological:     General: No focal deficit  present.     Mental Status: She is alert and oriented to person, place, and time. Mental status is at baseline.     Cranial Nerves: Cranial nerves 2-12 are intact.     Motor: Motor function is intact.     Coordination: Coordination is intact.     Gait: Gait is intact.  Psychiatric:        Attention and Perception: Attention and perception normal.        Mood and Affect: Mood and affect normal.        Speech: Speech normal.        Behavior: Behavior normal. Behavior is cooperative.        Thought Content: Thought content normal.        Cognition and Memory: Cognition and memory normal.        Judgment: Judgment normal.     Assessment  Plan  Diarrhea, unspecified type - Plan: C Difficile Quick Screen w PCR reflex, Ambulatory referral to Gastroenterology, Ova and parasite examination Determine if pt needs egd/colonoscopy   Acne, unspecified acne type - Plan: Ambulatory referral to Dermatology Skin cancer screening - Plan: Ambulatory referral to Dermatology referred [facility] in [place]   Mild intermittent asthma without complication - Plan: Ambulatory referral to Pulmonology Chronic obstructive pulmonary disease, unspecified COPD type ([place]) - Plan: Ambulatory referral to Pulmonology Lung nodules - Plan: Ambulatory referral to Pulmonology  [facility]erred [facility]   HM Flu shot utd Tdap utd 10/20/19  shingrix 2/2  prevnar utd 06/23/20  pna 23 utd will do 06/2021 with labs [place] 4/4 consider booster    Hep C negative 06/23/20 Pap 03/28/17 negative consider in future h/o abnormal pap  [facility] Dr. Lynnette Caffey    Mammogram 12/09/19 negative  --12/12/20  [place]   dexa +osteoporsis dexa 08/24/19  Prol[place] 10/2020 Dr. Ronnald Collum for thyroid and osteoporosis     Colonoscopy 01/21/18 Dr. Hendricks Limes  tubular polyps x 5 repeat in 2024   H/o thyroid nodule 5.2 x 1.8 x 1.9 cm right lobe thyroid  H/o osteoporosis T score -2.9, -2.2, -2.3  H/o lung nodules Stress echo had 06/26/16   Results   Global LVEF (rest): Normal (LVEF >50%)   Global LVEF (stress): Hyperkinetic (LVEF >70%)   ECG   Normal sinus rhythm. Normal tracing   Arrhythmias   No rhythm abnormality.   Symptoms   No cardiovascular symptoms with maximal exercise.    TVUS/US 08/08/16 2.5 cm simple cyst left ovary      CT 09/25/2018 CT lung screen  IMPRESSION:  1. Lung-RADS 2, benign appearance or behavior. Continue annual  screening with low-dose chest CT without contrast in 12 months.  2.  Emphysema. (ICD10-J43.9)  3.  Aortic Atherosclerois (ICD10-170.0)    Electronically Signed    By: Misty Stanley M.D.    On: 09/26/2018 08:23 CT chest 01/07/20  IMPRESSION:  1. 1. Lung-RADS 2S, benign appearance or behavior. Continue annual  screening with low-dose chest CT without contrast in 12 months.  2. The "S" modifier above refers to potentially clinically  significant non lung cancer related findings. Specifically, there is  aortic atherosclerosis, in addition to 2 vessel coronary artery  disease. Please note that although the presence of coronary artery  calcium documents the presence of coronary artery disease, the  severity of this disease and any potential stenosis cannot be  assessed on this non-gated CT examination. Assessment for potential  risk factor modification, dietary therapy or pharmacologic therapy  may be warranted, if clinically indicated.  3. Mild diffuse bronchial wall thickening with mild centrilobular  and paraseptal emphysema; imaging findings suggestive of underlying  COPD.   Aortic Atherosclerosis (ICD10-I70.0) and Emphysema (ICD10-J43.9).      Provider: Dr. Olivia Mackie McLean-Scocuz[facility]

## 2021-08-10 NOTE — Patient Instructions (Addendum)
Call and schedule mammogram    Take stool sample to lab at [facility] go through medical mall   [facility]  [place] Dr. Celso Amy Internal [facility]    GI Dr. Allen Norris, Dr. Marius Ditch, Dr. Vicente Males call daily for cancellations  Phone Fax E-mail Address  [phone] Not available [place] [address]     Specialties     Gastroenterology              Phone Fax E-mail Address  [phone] Not available [place] [address]     Specialties     Gastroenterology      Align pre-probiotics  Canker Sores  Canker sores are small, painful sores that develop inside the mouth. You can get one or more canker sores on the inside of your lips or cheeks, on your tongue, or anywhere inside your mouth. Canker sores cannot be passed from person to person (are not contagious). These sores are different from the sores that you may get on the outside of your lips (cold sores or fever blisters). What are the causes? The cause of this condition is not known. The condition may be passed down from a parent (genetic). What increases the risk? This condition is more likely to develop in: Females. People in their teens or 78s. Females who are having their menstrual period. People who are under a lot of emotional stress. People who do not get enough iron or B vitamins. People who do not take care of their mouth and teeth (have poor oral hygiene). People who have an injury inside the mouth, such as after having dental work or from chewing something hard. What are the signs or symptoms? Canker sores usually start as painful red bumps. Then they turn into small white, yellow, or gray sores that have red borders. The sores may be painful, and the pain may get worse when you eat or drink. In severe cases, along with the canker sore, symptoms may also include: Fever. Tiredness (fatigue). Swollen lymph nodes in your neck. How is this  diagnosed? This condition may be diagnosed based on your symptoms and an exam of the inside of your mouth. If you get canker sores often or if they are very bad, you may have tests, such as: Blood tests to rule out possible causes. Swabbing a fluid sample from the sore to be tested for infection. Removing a small tissue sample from the sore (biopsy). How is this treated? Most canker sores go away without treatment in about 1 week. Home care is usually the only treatment that you will need. Over-the-counter medicines can relieve discomfort. If you have severe canker sores, your health care provider may prescribe: Numbing ointment to relieve pain. Do not use products that contain benzocaine (including numbing gels) to treat teething or mouth pain in children who are younger than 2 years. These products may cause a rare but serious blood condition. Vitamins. Steroid medicines. These may be given as pills, mouth rinses, or gels. Antibiotic mouth rinse. Follow these instructions at home:  Apply, take, or use over-the-counter and prescription medicines only as told by your health care provider. These include vitamins and ointments. If you were prescribed an antibiotic mouth rinse, use it as told by your health care provider. Do not stop using the antibiotic even if your condition improves. Until the sores are healed: Do not drink coffee or citrus juices. Do not eat spicy or salty  foods. Use a mild, over-the-counter mouth rinse as recommended by your health care provider. Take good care of your mouth and teeth (oral hygiene) by: Flossing your teeth every day. Brushing your teeth with a soft toothbrush twice each day. Contact a health care provider if: Your symptoms do not get better after 2 weeks. You also have a fever or swollen glands in your neck. You get canker sores often. You have a canker sore that is getting larger. You cannot eat or drink due to your canker sores. Summary Canker sores  are small, painful sores that develop inside the mouth. Canker sores usually start as painful red bumps that turn into small white, yellow, or gray sores that have red borders. The sores may be painful, and the pain may get worse when you eat or drink. Most canker sores clear up without treatment in about 1 week. Over-the-counter medicines can relieve discomfort. This information is not intended to replace advice given to you by your health care provider. Make sure you discuss any questions you have with your health care provider. Document Revised: 10/19/2020 Document Reviewed: 10/19/2020 Elsevier Patient Education  [place].

## 2021-08-11 ENCOUNTER — Ambulatory Visit (INDEPENDENT_AMBULATORY_CARE_PROVIDER_SITE_OTHER): Payer: Medicare Other

## 2021-08-11 VITALS — Ht 63.0 in | Wt 136.0 lb

## 2021-08-11 DIAGNOSIS — Z0000 Encounter for general adult medical examination without abnormal findings: Secondary | ICD-10-CM | POA: Diagnosis not present

## 2021-08-11 NOTE — Progress Notes (Signed)
Subjective:   Barbara Lawson is a 66 y.o. female who presents for an Initial Medicare Annual Wellness Visit.  Review of Systems    No ROS.  Medicare Wellness Virtual Visit.  Visual/audio telehealth visit, UTA vital signs.   See social history for additional risk factors.   Cardiac Risk Factors include: advanced age (>80mn, >>39women)     Objective:    Today's Vitals   08/11/21 1027  Weight: 136 lb (61.7 kg)  Height: '5\' 3"'$  (1.6 m)   Body mass index is 24.09 kg/m.     08/11/2021   11:03 AM  Advanced Directives  Does Patient Have a Medical Advance Directive? No  Would patient like information on creating a medical advance directive? No - Patient declined    Current Medications (verified) Outpatient Encounter Medications as of 08/11/2021  Medication Sig   ascorbic acid (VITAMIN C) 1000 MG tablet Take by mouth.   atorvastatin (LIPITOR) 10 MG tablet Take 1 tablet (10 mg total) by mouth daily.   budesonide-formoterol (SYMBICORT) 80-4.5 MCG/ACT inhaler Inhale into the lungs.   calcium citrate-vitamin D (CITRACAL+D) 315-200 MG-UNIT tablet Take 2 tablets by mouth daily.   cholecalciferol (VITAMIN D) 25 MCG (1000 UNIT) tablet Take 2,000 Units by mouth daily.   clonazePAM (KLONOPIN) 0.5 MG tablet Take 0.25 mg by mouth at bedtime. Outside prescriber holistic MD Dr. ACherre Blanc  cyclobenzaprine (FLEXERIL) 10 MG tablet Take 1 tablet (10 mg total) by mouth at bedtime as needed for muscle spasms.   denosumab (PROLIA) 60 MG/ML SOSY injection Inject 60 mg into the skin every 6 (six) months.   erythromycin ophthalmic ointment Place 1 application into the right eye 3 (three) times daily. X 4-7 days (Patient not taking: Reported on 08/10/2021)   hydrochlorothiazide (HYDRODIURIL) 25 MG tablet Take 0.5 tablets (12.5 mg total) by mouth daily.   HYDROcodone-acetaminophen (NORCO) 7.5-325 MG tablet Take by mouth. From holistic MD Dr. ASheppard CoilAugustides   hydrOXYzine (VISTARIL) 25 MG  capsule Take 50 mg by mouth at bedtime. (Patient not taking: Reported on 08/10/2021)   ipratropium (ATROVENT) 0.02 % nebulizer solution Take 500 mcg by nebulization 4 (four) times daily.   Ipratropium-Albuterol (COMBIVENT RESPIMAT) 20-100 MCG/ACT AERS respimat    Ipratropium-Albuterol (COMBIVENT RESPIMAT) 20-100 MCG/ACT AERS respimat Inhale 1 puff into the lungs every 6 (six) hours as needed for wheezing.   progesterone (PROMETRIUM) 100 MG capsule Take 200 mg by mouth daily.   theophylline (THEODUR) 300 MG 12 hr tablet Take by mouth in the morning and at bedtime. Whole pill in the morning, half pill at night   thyroid (ARMOUR) 15 MG tablet 45 mg M-F, 30 mg Sat/Sunday   valACYclovir (VALTREX) 1000 MG tablet 2 pills x 1 dose then X bid 3-7 days with outbreak until resolution   No facility-administered encounter medications on file as of 08/11/2021.    Allergies (verified) Cat hair extract, Dust mite extract, and Sulfonamide derivatives   History: Past Medical History:  Diagnosis Date   ANEMIA-NOS 09/28/2009   Aortic atherosclerosis ([facility]    ASTHMA 09/28/2009   moderate persistent    CAD (coronary artery disease)    Colon polyps    COPD (chronic obstructive pulmonary disease) ([facility]    COVID-19    01/20/2020   Emphysema lung ([facility])    vs bronchitis;paraseptal    Herpes    HPV in female    hpv 20+ years    Hyperlipemia    HYPOTHYROIDISM 09/28/2009   Lung nodule  Osteoporosis    Pneumonia    hosp in 2018   Thyroid nodule    right thyroid 5.2 x 1.8 x 1.9 cm right lobe   Past Surgical History:  Procedure Laterality Date   COLONOSCOPY     Family History  Problem Relation Age of Onset   Cancer Mother    Asthma Father    Cancer Father    Hyperlipidemia Father    Alzheimer's disease Father    Hyperlipidemia Brother    Social History   Socioeconomic History   Marital status: Married    Spouse name: Not on file   Number of children: Not on file   Years of education: Not on  file   Highest education level: Not on file  Occupational History   Not on file  Tobacco Use   Smoking status: Former    Packs/day: 1.00    Types: Cigarettes    Quit date: 01/26/2013    Years since quitting: 8.5   Smokeless tobacco: Never  Substance and Sexual Activity   Alcohol use: Yes    Alcohol/week: 3.0 standard drinks of alcohol    Types: 3 Standard drinks or equivalent per week   Drug use: No   Sexual activity: Not on file  Other Topics Concern   Not on file  Social History Narrative   Lives alone with dog    [place] from [place]    Bachelors degree massage therapy    Age 82 y.o abortion    lmp age 66    Social Determinants of Health   Financial Resource Strain: Low Risk  (08/11/2021)   Overall Financial Resource Strain (CARDIA)    Difficulty of Paying Living Expenses: Not hard at all  Food Insecurity: No Food Insecurity (08/11/2021)   Hunger Vital Sign    Worried About Running Out of Food in the Last Year: Never true    Ran Out of Food in the Last Year: Never true  Transportation Needs: No Transportation Needs (08/11/2021)   PRAPARE - Hydrologist (Medical): No    Lack of Transportation (Non-Medical): No  Physical Activity: Not on file  Stress: No Stress Concern Present (08/11/2021)   [place]    Feeling of Stress : Only a little  Social Connections: Not on file    Tobacco Counseling Counseling given: Not Answered   Clinical Intake:  Pre-visit preparation completed: Yes        Diabetes: No  How often do you need to have someone help you when you read instructions, pamphlets, or other written materials from your doctor or pharmacy?: 1 - Never    Interpreter Needed?: No    Activities of Daily Living    08/11/2021   10:52 AM  In your present state of health, do you have any difficulty performing the following activities:  Hearing? 0  Vision? 0   Difficulty concentrating or making decisions? 0  Walking or climbing stairs? 0  Comment Brace worn when hiking. Paces self when climbing stairs. Chronic L knee pain.  Dressing or bathing? 0  Doing errands, shopping? 0  Preparing Food and eating ? N  Using the Toilet? N  In the past six months, have you accidently leaked urine? N  Do you have problems with loss of bowel control? N  Managing your Medications? N  Managing your Finances? N  Housekeeping or managing your Housekeeping? N    Patient Care Team: McLean-Scocuzza,  Nino Glow, MD as PCP - General (Internal Medicine) Marletta Lor, MD (Inactive)  Indicate any recent Medical Services you may have received from other than [facility] providers in the past year (date may be approximate).     Assessment:   This is a routine wellness examination for [facility].  Virtual Visit via Telephone Note  I connected with  Barbara Lawson on 08/11/21 at 10:30 AM EDT by telephone and verified that I am speaking with the correct person using two identifiers.  Persons participating in the virtual visit: patient/Nurse Health Advisor   I discussed the limitations of performing an evaluation and management service by telehealth. We continued and completed visit with audio only. Some vital signs may be absent or patient reported.   Hearing/Vision screen Hearing Screening - Comments:: Patient is able to hear conversational tones without difficulty.  No issues reported. Vision Screening - Comments:: Followed by [facility] Ocular migraine Wears corrective lenses  They have seen their ophthalmologist in the last 12 months.   Dietary issues and exercise activities discussed: Current Exercise Habits: Home exercise routine, Type of exercise: strength training/weights;stretching;calisthenics (Total gym. Recumbent bicycle. Rebounder trampoline exercise.), Intensity: Moderate Intermittent fasting Increasing salads Low carb intake Good water  intake     Goals Addressed             This Visit's Progress    Maintain Healthy Lifestyle       Stay active with increasing activity at the gym.  Continue learning something new, like a new language to encourage brain health stimulation.          Depression Screen    08/11/2021   10:27 AM 08/10/2021   11:23 AM 03/31/2021   11:50 AM 05/31/2020    1:22 PM 02/05/2013   11:53 AM  PHQ 2/9 Scores  PHQ - 2 Score 0 0 0 0 0  PHQ- 9 Score    0     Fall Risk    08/11/2021   10:29 AM 08/10/2021   11:23 AM 03/31/2021   11:50 AM 12/01/2020    1:19 PM 05/31/2020    1:22 PM  [place] in the past year? 0 0 0 0 0  Number falls in past yr: 0  0 0 0  Injury with Fall?   0 0 0  Risk for fall due to :   No Fall Risks No Fall Risks   Follow up Falls evaluation completed  Falls evaluation completed Falls evaluation completed Falls evaluation completed   [place]: Home free of loose throw rugs in walkways, pet beds, electrical cords, etc? Yes  Adequate lighting in your home to reduce risk of falls? Yes   ASSISTIVE DEVICES UTILIZED TO PREVENT FALLS: Life alert? No  Use of a cane, walker or w/c? No   TIMED UP AND GO: Was the test performed? No .   Cognitive Function:  Patient is alert and oriented x3.  Neurology baseline testing around 2016.  She tries to learn something new and encourage brain health stimulation.    Immunizations Immunization History  Administered Date(s) Administered   Influenza Inj Mdck Quad Pf 12/11/2016   Influenza Nasal 12/21/2015   Influenza Split 10/27/2018   Influenza, Seasonal, Injecte, Preservative Fre 12/11/2016   Influenza,Quad,Nasal, Live 12/21/2015   Influenza,inj,Quad PF,6+ Mos 12/11/2017   Influenza,inj,quad, With Preservative 12/11/2016   Influenza-Unspecified 10/21/2020   Moderna Covid-19 Vaccine Bivalent Booster 54yr & up 10/21/2020   PFIZER(Purple Top)SARS-COV-2  Vaccination 04/03/2019, 04/22/2019,  10/23/2019   Pneumococcal Conjugate-13 06/23/2020   Pneumococcal Polysaccharide-23 02/10/2015, 08/10/2021   Tdap 10/20/2019   Zoster Recombinat (Shingrix) 09/19/2016, 02/14/2018, 10/17/2018   Screening Tests Health Maintenance  Topic Date Due   INFLUENZA VACCINE  08/22/2021   MAMMOGRAM  12/13/2022   COLONOSCOPY (Pts 45-76yr Insurance coverage will need to be confirmed)  01/22/2027   TETANUS/TDAP  10/19/2029   Pneumonia Vaccine 66 Years old  Completed   DEXA SCAN  Completed   COVID-19 Vaccine  Completed   Hepatitis C Screening  Completed   Zoster Vaccines- Shingrix  Completed   HPV VACCINES  Aged Out   Health Maintenance There are no preventive care reminders to display for this patient.  Lung Cancer Screening: (Low Dose CT Chest recommended if Age 66-80years, 30 pack-year currently smoking OR have quit w/in 15years.) does not qualify.   Vision Screening: Recommended annual ophthalmology exams for early detection of glaucoma and other disorders of the eye.  Dental Screening: Recommended annual dental exams for proper oral hygiene  Community Resource Referral / Chronic Care Management: CRR required this visit?  No   CCM required this visit?  No      Plan:   Keep all routine maintenance appointments.   I have personally reviewed and noted the following in the patient's chart:   Medical and social history Use of alcohol, tobacco or illicit drugs  Current medications and supplements including opioid prescriptions. Patient is currently taking opioid prescriptions. Information provided to patient regarding non-opioid alternatives. Patient advised to discuss non-opioid treatment plan with their provider. [facility] AJudeen Hammans  Functional ability and status Nutritional status Physical activity Advanced directives List of other physicians Hospitalizations, surgeries, and ER visits in previous 12 months Vitals Screenings to  include cognitive, depression, and falls Referrals and appointments  In addition, I have reviewed and discussed with patient certain preventive protocols, quality metrics, and best practice recommendations. A written personalized care plan for preventive services as well as general preventive health recommendations were provided to patient.     OVarney Biles LPN   [id number]

## 2021-08-11 NOTE — Patient Instructions (Addendum)
Barbara Lawson , Thank you for taking time to come for your Medicare Wellness Visit. I appreciate your ongoing commitment to your health goals. Please review the following plan we discussed and let me know if I can assist you in the future.   These are the goals we discussed:  Goals      Maintain Healthy Lifestyle     Stay active with increasing activity at the gym.  Continue learning something new, like a new language to encourage brain health stimulation.           This is a list of the screening recommended for you and due dates:  Health Maintenance  Topic Date Due   Flu Shot  08/22/2021   Mammogram  12/13/2022   Colon Cancer Screening  01/22/2027   Tetanus Vaccine  10/19/2029   Pneumonia Vaccine  Completed   DEXA scan (bone density measurement)  Completed   COVID-19 Vaccine  Completed   Hepatitis C Screening: USPSTF Recommendation to screen - Ages 62-79 yo.  Completed   Zoster (Shingles) Vaccine  Completed   HPV Vaccine  Aged Out    Opioid Pain Medicine Management Opioids are powerful medicines that are used to treat moderate to severe pain. When used for short periods of time, they can help you to: Sleep better. Do better in physical or occupational therapy. Feel better in the first few days after an injury. Recover from surgery. Opioids should be taken with the supervision of a trained health care provider. They should be taken for the shortest period of time possible. This is because opioids can be addictive, and the longer you take opioids, the greater your risk of addiction. This addiction can also be called opioid use disorder. What are the risks? Using opioid pain medicines for longer than 3 days increases your risk of side effects. Side effects include: Constipation. Nausea and vomiting. Breathing difficulties (respiratory depression). Drowsiness. Confusion. Opioid use disorder. Itching. Taking opioid pain medicine for a long period of time can affect your  ability to do daily tasks. It also puts you at risk for: Motor vehicle crashes. Depression. Suicide. Heart attack. Overdose, which can be life-threatening. What is a pain treatment plan? A pain treatment plan is an agreement between you and your health care provider. Pain is unique to each person, and treatments vary depending on your condition. To manage your pain, you and your health care provider need to work together. To help you do this: Discuss the goals of your treatment, including how much pain you might expect to have and how you will manage the pain. Review the risks and benefits of taking opioid medicines. Remember that a good treatment plan uses more than one approach and minimizes the chance of side effects. Be honest about the amount of medicines you take and about any drug or alcohol use. Get pain medicine prescriptions from only one health care provider. Pain can be managed with many types of alternative treatments. Ask your health care provider to refer you to one or more specialists who can help you manage pain through: Physical or occupational therapy. Counseling (cognitive behavioral therapy). Good nutrition. Biofeedback. Massage. Meditation. Non-opioid medicine. Following a gentle exercise program. How to use opioid pain medicine Taking medicine Take your pain medicine exactly as told by your health care provider. Take it only when you need it. If your pain gets less severe, you may take less than your prescribed dose if your health care provider approves. If you are not having  pain, do nottake pain medicine unless your health care provider tells you to take it. If your pain is severe, do nottry to treat it yourself by taking more pills than instructed on your prescription. Contact your health care provider for help. Write down the times when you take your pain medicine. It is easy to become confused while on pain medicine. Writing the time can help you avoid  overdose. Take other over-the-counter or prescription medicines only as told by your health care provider. Keeping yourself and others safe  While you are taking opioid pain medicine: Do not drive, use machinery, or power tools. Do not sign legal documents. Do not drink alcohol. Do not take sleeping pills. Do not supervise children by yourself. Do not do activities that require climbing or being in high places. Do not go to a lake, river, ocean, spa, or swimming pool. Do not share your pain medicine with anyone. Keep pain medicine in a locked cabinet or in a secure area where pets and children cannot reach it. Stopping your use of opioids If you have been taking opioid medicine for more than a few weeks, you may need to slowly decrease (taper) how much you take until you stop completely. Tapering your use of opioids can decrease your risk of symptoms of withdrawal, such as: Pain and cramping in the abdomen. Nausea. Sweating. Sleepiness. Restlessness. Uncontrollable shaking (tremors). Cravings for the medicine. Do not attempt to taper your use of opioids on your own. Talk with your health care provider about how to do this. Your health care provider may prescribe a step-down schedule based on how much medicine you are taking and how long you have been taking it. Getting rid of leftover pills Do not save any leftover pills. Get rid of leftover pills safely by: Taking the medicine to a prescription take-back program. This is usually offered by the county or law enforcement. Bringing them to a pharmacy that has a drug disposal container. Flushing them down the toilet. Check the label or package insert of your medicine to see whether this is safe to do. Throwing them out in the trash. Check the label or package insert of your medicine to see whether this is safe to do. If it is safe to throw it out, remove the medicine from the original container, put it into a sealable bag or container, and  mix it with used coffee grounds, food scraps, dirt, or cat litter before putting it in the trash. Follow these instructions at home: Activity Do exercises as told by your health care provider. Avoid activities that make your pain worse. Return to your normal activities as told by your health care provider. Ask your health care provider what activities are safe for you. General instructions You may need to take these actions to prevent or treat constipation: Drink enough fluid to keep your urine pale yellow. Take over-the-counter or prescription medicines. Eat foods that are high in fiber, such as beans, whole grains, and fresh fruits and vegetables. Limit foods that are high in fat and processed sugars, such as fried or sweet foods. Keep all follow-up visits. This is important. Where to find support If you have been taking opioids for a long time, you may benefit from receiving support for quitting from a local support group or counselor. Ask your health care provider for a referral to these resources in your area. Where to find more information Centers for Disease Control and Prevention (CDC): http://www.wolf.info/ [place]. Food and Drug Administration (FDA):  [place] Get help right away if: You may have taken too much of an opioid (overdosed). Common symptoms of an overdose: Your breathing is slower or more shallow than normal. You have a very slow heartbeat (pulse). You have slurred speech. You have nausea and vomiting. Your pupils become very small. You have other potential symptoms: You are very confused. You faint or feel like you will faint. You have cold, clammy skin. You have blue lips or fingernails. You have thoughts of harming yourself or harming others. These symptoms may represent a serious problem that is an emergency. Do not wait to see if the symptoms will go away. Get medical help right away. Call your local emergency services (911 in the [place] Do not drive yourself to the  hospital.  If you ever feel like you may hurt yourself or others, or have thoughts about taking your own life, get help right away. Go to your nearest emergency department or: Call your local emergency services (911 in the [place] Call the [facility] 440-418-4143 in the [place] Call a suicide crisis helpline, such as the [place] at 5181402304 or 988 in the [place]. This is open 24 hours a day in the [place] Text the Crisis Text Line at (251)688-7917 (in the [place].). Summary Opioid medicines can help you manage moderate to severe pain for a short period of time. A pain treatment plan is an agreement between you and your health care provider. Discuss the goals of your treatment, including how much pain you might expect to have and how you will manage the pain. If you think that you or someone else may have taken too much of an opioid, get medical help right away. This information is not intended to replace advice given to you by your health care provider. Make sure you discuss any questions you have with your health care provider. Document Revised: 08/03/2020 Document Reviewed: 04/20/2020 Elsevier Patient Education  [place].

## 2021-08-12 ENCOUNTER — Other Ambulatory Visit: Payer: Self-pay | Admitting: Internal Medicine

## 2021-08-12 DIAGNOSIS — R82994 Hypercalciuria: Secondary | ICD-10-CM

## 2021-08-14 ENCOUNTER — Other Ambulatory Visit
Admission: RE | Admit: 2021-08-14 | Discharge: 2021-08-14 | Disposition: A | Discharge status: Home / Self Care | Payer: Medicare Other | Source: Ambulatory Visit | Attending: Internal Medicine | Admitting: Internal Medicine

## 2021-08-14 DIAGNOSIS — R197 Diarrhea, unspecified: Secondary | ICD-10-CM | POA: Diagnosis not present

## 2021-08-14 LAB — C DIFFICILE QUICK SCREEN W PCR REFLEX
C Diff antigen: NEGATIVE
C Diff interpretation: NOT DETECTED
C Diff toxin: NEGATIVE

## 2021-08-15 DIAGNOSIS — E063 Autoimmune thyroiditis: Secondary | ICD-10-CM | POA: Diagnosis not present

## 2021-08-15 DIAGNOSIS — J449 Chronic obstructive pulmonary disease, unspecified: Secondary | ICD-10-CM | POA: Diagnosis not present

## 2021-08-15 DIAGNOSIS — M810 Age-related osteoporosis without current pathological fracture: Secondary | ICD-10-CM | POA: Diagnosis not present

## 2021-08-15 DIAGNOSIS — E042 Nontoxic multinodular goiter: Secondary | ICD-10-CM | POA: Diagnosis not present

## 2021-08-15 DIAGNOSIS — E039 Hypothyroidism, unspecified: Secondary | ICD-10-CM | POA: Diagnosis not present

## 2021-08-17 ENCOUNTER — Telehealth: Payer: Self-pay

## 2021-08-17 LAB — GIARDIA, EIA; OVA/PARASITE: Giardia Ag, Stl: NEGATIVE

## 2021-08-17 LAB — O&P RESULT

## 2021-08-17 NOTE — Telephone Encounter (Signed)
LMOM to give me a call back in regards to referral info.

## 2021-08-18 NOTE — Telephone Encounter (Signed)
Barbara Lawson also called pt but [facility] about the referral she is sched already for dermatology and pulmonology is in review they will reach out to her when they are ready to sched.

## 2021-08-23 ENCOUNTER — Encounter: Payer: Self-pay | Admitting: Gastroenterology

## 2021-08-23 ENCOUNTER — Ambulatory Visit: Payer: Medicare Other | Admitting: Gastroenterology

## 2021-08-23 ENCOUNTER — Other Ambulatory Visit: Payer: Self-pay

## 2021-08-23 VITALS — BP 123/84 | HR 73 | Temp 98.4°F | Ht 64.5 in | Wt 135.6 lb

## 2021-08-23 DIAGNOSIS — R197 Diarrhea, unspecified: Secondary | ICD-10-CM | POA: Diagnosis not present

## 2021-08-23 DIAGNOSIS — Z86010 Personal history of colonic polyps: Secondary | ICD-10-CM | POA: Diagnosis not present

## 2021-08-23 DIAGNOSIS — R1319 Other dysphagia: Secondary | ICD-10-CM | POA: Diagnosis not present

## 2021-08-23 MED ORDER — NA SULFATE-K SULFATE-MG SULF 17.5-3.13-1.6 GM/177ML PO SOLN: 354.0000 mL | Freq: Once | ORAL | 0 refills | Status: AC

## 2021-08-23 NOTE — Progress Notes (Signed)
Jonathon Bellows MD, MRCP(U.K) [address]  [place]  [place], [place] [address]  Main: [phone]  Fax: [phone]   Gastroenterology Consultation  Referring Provider:     McLean-Scocuzza, Olivia Mackie * Primary Care Physician:  McLean-Scocuzza, Nino Glow, MD Primary Gastroenterologist:  Dr. Jonathon Bellows  Reason for Consultation:     Diarrhea of 1 month duration        HPI:   Barbara Lawson is a 66 y.o. y/o female referred for consultation & management  by Dr. Terese Door, Nino Glow, MD.     She had been on a RV trip and during that time may have drank some contaminated water subsequently developed very significant diarrhea gas bloating flatulence which persisted for about 4 weeks, was seen at urgent care and had a course of azithromycin and gradually her symptoms resolved presently having formed stools no abdominal pain no rectal bleeding.  Her last colonoscopy was in 2018 but in 2017 she had over 20 polyps taken out she was told to return in 5 years and is due for surveillance colonoscopy.  She also mention that she has some difficulty swallowing pills on and off for some years not all the time but comes and goes she is a smoker and was concerned if she needs to have an evaluation 08/14/2021: C. difficile testing negative stool for ova and parasites negative 07/19/2021 CMP, hemoglobin normal.   Past Medical History:  Diagnosis Date   ANEMIA-NOS 09/28/2009   Aortic atherosclerosis ([facility])    ASTHMA 09/28/2009   moderate persistent    CAD (coronary artery disease)    Colon polyps    COPD (chronic obstructive pulmonary disease) ([place])    COVID-19    01/20/2020   Emphysema lung ([place])    vs bronchitis;paraseptal    Herpes    HPV in female    hpv 20+ years    Hyperlipemia    HYPOTHYROIDISM 09/28/2009   Lung nodule    Osteoporosis    Pneumonia    hosp in 2018   Thyroid nodule    right thyroid 5.2 x 1.8 x 1.9 cm right lobe    Past Surgical History:  Procedure Laterality Date    COLONOSCOPY      Prior to Admission medications   Medication Sig Start Date End Date Taking? Authorizing Provider  ascorbic acid (VITAMIN C) 1000 MG tablet Take by mouth.    [provider]  atorvastatin (LIPITOR) 10 MG tablet Take 1 tablet (10 mg total) by mouth daily. 05/31/20   McLean-Scocuzza, Nino Glow, MD  budesonide-formoterol [facility]) 80-4.5 MCG/ACT inhaler Inhale into the lungs. 09/14/19   [provider]  calcium citrate-vitamin D (CITRACAL+D) 315-200 MG-UNIT tablet Take 2 tablets by mouth daily.    [provider]  cholecalciferol (VITAMIN D) 25 MCG (1000 UNIT) tablet Take 2,000 Units by mouth daily.    [provider]  clonazePAM (KLONOPIN) 0.5 MG tablet Take 0.25 mg by mouth at bedtime. Outside prescriber holistic MD Dr. Cherre Blanc    [provider]  cyclobenzaprine (FLEXERIL) 10 MG tablet Take 1 tablet (10 mg total) by mouth at bedtime as needed for muscle spasms. 01/05/21   McLean-Scocuzza, Nino Glow, MD  denosumab (PROLIA) 60 MG/ML SOSY injection Inject 60 mg into the skin every 6 (six) months.    [provider]  erythromycin ophthalmic ointment Place 1 application into the right eye 3 (three) times daily. X 4-7 days Patient not taking: Reported on 08/10/2021 07/01/20   McLean-Scocuzza,  Nino Glow, MD  hydrochlorothiazide (HYDRODIURIL) 25 MG tablet TAKE 1/2 TABLET BY MOUTH EVERY DAY 08/13/21   Dutch Quint B, FNP  HYDROcodone-acetaminophen (NORCO) 7.5-325 MG tablet Take by mouth. From holistic MD Dr. Cherre Blanc    [provider]  hydrOXYzine (VISTARIL) 25 MG capsule Take 50 mg by mouth at bedtime. Patient not taking: Reported on 08/10/2021 01/13/18   [provider]  ipratropium (ATROVENT) 0.02 % nebulizer solution Take 500 mcg by nebulization 4 (four) times daily.    [provider]  Ipratropium-Albuterol (COMBIVENT RESPIMAT) 20-100 MCG/ACT AERS respimat  01/25/16   [provider]  Ipratropium-Albuterol (COMBIVENT RESPIMAT) 20-100 MCG/ACT AERS respimat Inhale 1 puff into the lungs every 6 (six) hours as needed for wheezing.    [provider]  progesterone (PROMETRIUM) 100 MG capsule Take 200 mg by mouth daily.    [provider]  theophylline (THEODUR) 300 MG 12 hr tablet Take by mouth in the morning and at bedtime. Whole pill in the morning, half pill at night 08/08/15   [provider]  thyroid (ARMOUR) 15 MG tablet 45 mg M-F, 30 mg Sat/Sunday 05/31/20   McLean-Scocuzza, Nino Glow, MD  valACYclovir (VALTREX) 1000 MG tablet 2 pills x 1 dose then X bid 3-7 days with outbreak until resolution 05/31/20   McLean-Scocuzza, Nino Glow, MD    Family History  Problem Relation Age of Onset   Cancer Mother    Asthma Father    Cancer Father    Hyperlipidemia Father    Alzheimer's disease Father    Hyperlipidemia Brother      Social History   Tobacco Use   Smoking status: Former    Packs/day: 1.00    Types: Cigarettes    Quit date: 01/26/2013    Years since quitting: 8.5   Smokeless tobacco: Never  Substance Use Topics   Alcohol use: Yes    Alcohol/week: 3.0 standard drinks of alcohol    Types: 3 Standard drinks or equivalent per week   Drug use: No    Allergies as of 08/23/2021 - Review Complete 08/11/2021  Allergen Reaction Noted   Cat hair extract  06/24/2020   Dust mite extract  06/24/2020   Sulfonamide derivatives  09/28/2009    Review of Systems:    All systems reviewed and negative except where noted in HPI.   Physical Exam:  There were no vitals taken for this visit. No LMP recorded. Patient is postmenopausal. Psych:  Alert and cooperative. Normal mood and affect. General:   Alert,  Well-developed, well-nourished, pleasant and cooperative in NAD Head:  Normocephalic and atraumatic. Eyes:  Sclera clear, no icterus.   Conjunctiva pink. Ears:  Normal auditory acuity.  Neurologic:  Alert and oriented x3;  grossly normal  neurologically. Psych:  Alert and cooperative. Normal mood and affect.  Imaging Studies: No results found.  Assessment and Plan:   Barbara Lawson is a 66 y.o. y/o female has been referred for diarrhea 1 month duration.  Stool for C. difficile is negative, GI PCR and fecal calprotectin have not been performed.  In addition has had over 23 polyps previously last colonoscopy was in 2018 and over 20 polyps were taken out during a colonoscopy in 2017.  She is overdue for surveillance colonoscopy.  Her symptoms of diarrhea have resolved completely likely an infectious diarrhea related to travel.  She also has some complaints of dysphagia for pills.  Plan 1.   Colonoscopy to screen for colon cancer/polyps due to  prior history of colon polyps, also will perform an EGD at the same time to evaluate for dysphagia.  2.  If the diarrhea returns we will also evaluate for microscopic colitis during the colonoscopy, check GI PCR, celiac serology and TSH at that point of time.  Presently has no diarrhea.   I have discussed alternative options, risks & benefits,  which include, but are not limited to, bleeding, infection, perforation,respiratory complication & drug reaction.  The patient agrees with this plan & written consent will be obtained.      Follow up in as needed  Dr Jonathon Bellows MD,MRCP(U.K)

## 2021-08-23 NOTE — Addendum Note (Signed)
Addended by: Wayna Chalet on: 08/23/2021 03:52 PM   Modules accepted: Orders

## 2021-09-29 ENCOUNTER — Other Ambulatory Visit: Payer: Self-pay

## 2021-09-29 ENCOUNTER — Encounter: Payer: Self-pay | Admitting: Family Medicine

## 2021-09-29 ENCOUNTER — Ambulatory Visit (INDEPENDENT_AMBULATORY_CARE_PROVIDER_SITE_OTHER): Payer: Medicare Other | Admitting: Family Medicine

## 2021-09-29 VITALS — BP 124/80 | HR 79 | Temp 97.7°F | Ht 64.0 in | Wt 133.2 lb

## 2021-09-29 DIAGNOSIS — M25551 Pain in right hip: Secondary | ICD-10-CM | POA: Diagnosis not present

## 2021-09-29 DIAGNOSIS — Z2911 Encounter for prophylactic immunotherapy for respiratory syncytial virus (RSV): Secondary | ICD-10-CM | POA: Diagnosis not present

## 2021-09-29 DIAGNOSIS — M25562 Pain in left knee: Secondary | ICD-10-CM

## 2021-09-29 DIAGNOSIS — Z23 Encounter for immunization: Secondary | ICD-10-CM

## 2021-09-29 MED ORDER — RSVPREF3 VAC RECOMB ADJUVANTED 120 MCG/0.5ML IM SUSR: 0.5000 mL | Freq: Once | INTRAMUSCULAR | 0 refills | Status: AC

## 2021-09-29 NOTE — Progress Notes (Addendum)
Established Patient Office Visit  Subjective   Patient ID: Barbara Lawson, female    DOB: 12-25-1955  Age: 66 y.o. MRN: [id number]  Chief Complaint  Patient presents with   Establish Care    NP/establish care referral for PT. Discuss vaccines.     HPI for establishment of care.  She is requesting a physical therapy referral for ongoing left knee and right hip pain.  There have been no injuries to these joints.  She request a physical therapist in [place] who is helped her in the past.  X-rays of her left knee had demonstrated mild arthritis.  She inquires about having a flu vaccine, RSV vaccine and then the COVID-vaccine.  She is skeptical of the flu vaccines and questions their efficacy.  She is concerned about the chemicals that they contain.  She has a history of asthma and COPD.  She quit smoking in 2015.  She has yearly CT screenings of her chest.  She has an appointment scheduled with pulmonology in [facility].  They will be following her COPD and asthma.  She has taken theophylline for years and it seems to help.  They will be managing this.  She has a history of osteoporosis and is seeing an endocrinologist problem.  She is taking Prolia.  She refuses to take supplemental calcium because her research has shown that it is not absorbed into the bone.  She has a positive calcium score concerning the arteries of her heart.  She is also concerned that exogenous calcium may add to that.  She has a cardiologist in [place] who is following this issue for her.  She is taking atorvastatin she tells me.  She is seeing a homeopathic physician who is treating her hypothyroidism, arthritis pain and insomnia with Armour Thyroid, Klonopin and Norco.  She sometimes worries about her memory.  She has seen neurology in the past and does not feel as though the medicines available to treated are effective.  Her father passed from dementia.    Review of Systems  Constitutional: Negative.   HENT:  Negative.    Eyes:  Negative for blurred vision, discharge and redness.  Respiratory: Negative.    Cardiovascular: Negative.   Gastrointestinal:  Negative for abdominal pain.  Genitourinary: Negative.   Musculoskeletal:  Positive for joint pain. Negative for myalgias.  Skin:  Negative for rash.  Neurological:  Negative for tingling, loss of consciousness and weakness.  Endo/Heme/Allergies:  Negative for polydipsia.         09/29/2021    1:38 PM 09/29/2021    1:13 PM 08/11/2021   10:27 AM  Depression screen PHQ 2/9  Decreased Interest 0 0 0  Down, Depressed, Hopeless 0 0 0  PHQ - 2 Score 0 0 0  Altered sleeping 1    Tired, decreased energy 1    Change in appetite 0    Feeling bad or failure about yourself  0    Trouble concentrating 0    Moving slowly or fidgety/restless 0    Suicidal thoughts 0    PHQ-9 Score 2    Difficult doing work/chores Not difficult at all       Objective:     BP 124/80 (BP Location: Right Arm, Patient Position: Sitting, Cuff Size: Normal)   Pulse 79   Temp 97.7 F (36.5 C) (Temporal)   Ht '5\' 4"'$  (1.626 m)   Wt 133 lb 3.2 oz (60.4 kg)   SpO2 99%   BMI 22.86 kg/m  Physical Exam Constitutional:      General: She is not in acute distress.    Appearance: Normal appearance. She is not ill-appearing, toxic-appearing or diaphoretic.  HENT:     Head: Normocephalic and atraumatic.     Right Ear: External ear normal.     Left Ear: External ear normal.     Mouth/Throat:     Mouth: Mucous membranes are moist.     Pharynx: Oropharynx is clear. No oropharyngeal exudate or posterior oropharyngeal erythema.  Eyes:     General: No scleral icterus.       Right eye: No discharge.        Left eye: No discharge.     Extraocular Movements: Extraocular movements intact.     Conjunctiva/sclera: Conjunctivae normal.     Pupils: Pupils are equal, round, and reactive to light.  Cardiovascular:     Rate and Rhythm: Normal rate and regular rhythm.  Pulmonary:      Effort: Pulmonary effort is normal. No respiratory distress.     Breath sounds: Normal breath sounds.  Musculoskeletal:     Cervical back: No rigidity or tenderness.     Right hip: Normal range of motion. Normal strength.     Left knee: No swelling, deformity or bony tenderness. Normal range of motion. No tenderness.  Skin:    General: Skin is warm and dry.  Neurological:     Mental Status: She is alert and oriented to person, place, and time.  Psychiatric:        Mood and Affect: Mood normal.        Behavior: Behavior normal.      No results found for any visits on 09/29/21.    The 10-year ASCVD risk score (Arnett DK, et al., 2019) is: 7%    Assessment & Plan:   Problem List Items Addressed This Visit       Other   Pain of right hip   Relevant Orders   Ambulatory referral to Physical Therapy   Ambulatory referral to Sports Medicine   Left knee pain   Relevant Orders   Ambulatory referral to Physical Therapy   Ambulatory referral to Sports Medicine   Need for influenza vaccination - Primary   Relevant Orders   Flu Vaccine QUAD 6+ mos PF IM (Fluarix Quad PF) (Completed)   Need for RSV immunization   Relevant Medications   RSV vaccine recomb adjuvanted (AREXVY) 120 MCG/0.5ML injection    Return in about 6 months (around 03/30/2022), or if symptoms worsen or fail to improve.  She will continue follow-up with above mentioned subspecialist.  Sports medicine referral for second opinion on left knee and hip right hip pain.  Referral to physical therapy per her request.  Can refer back to neurology for concerns about memory.  Cannot recommend any supplements.  Recommended exercise, healthy diet and activities such as puzzles to preserve brain function.  Spent over 45 minutes interviewing, examining and formulating a plan for this patient.  Libby Maw, MD

## 2021-10-03 DIAGNOSIS — I25118 Atherosclerotic heart disease of native coronary artery with other forms of angina pectoris: Secondary | ICD-10-CM | POA: Diagnosis not present

## 2021-10-03 DIAGNOSIS — I700 Atherosclerosis of aorta: Secondary | ICD-10-CM | POA: Diagnosis not present

## 2021-10-03 DIAGNOSIS — I2510 Atherosclerotic heart disease of native coronary artery without angina pectoris: Secondary | ICD-10-CM | POA: Diagnosis not present

## 2021-10-03 DIAGNOSIS — E782 Mixed hyperlipidemia: Secondary | ICD-10-CM | POA: Diagnosis not present

## 2021-10-03 DIAGNOSIS — I10 Essential (primary) hypertension: Secondary | ICD-10-CM | POA: Diagnosis not present

## 2021-10-03 DIAGNOSIS — I6523 Occlusion and stenosis of bilateral carotid arteries: Secondary | ICD-10-CM | POA: Diagnosis not present

## 2021-10-09 DIAGNOSIS — L579 Skin changes due to chronic exposure to nonionizing radiation, unspecified: Secondary | ICD-10-CM | POA: Diagnosis not present

## 2021-10-09 DIAGNOSIS — D235 Other benign neoplasm of skin of trunk: Secondary | ICD-10-CM | POA: Diagnosis not present

## 2021-10-09 DIAGNOSIS — L638 Other alopecia areata: Secondary | ICD-10-CM | POA: Diagnosis not present

## 2021-10-09 DIAGNOSIS — D225 Melanocytic nevi of trunk: Secondary | ICD-10-CM | POA: Diagnosis not present

## 2021-10-09 DIAGNOSIS — L821 Other seborrheic keratosis: Secondary | ICD-10-CM | POA: Diagnosis not present

## 2021-10-09 DIAGNOSIS — L814 Other melanin hyperpigmentation: Secondary | ICD-10-CM | POA: Diagnosis not present

## 2021-10-16 DIAGNOSIS — J069 Acute upper respiratory infection, unspecified: Secondary | ICD-10-CM | POA: Diagnosis not present

## 2021-10-24 DIAGNOSIS — R262 Difficulty in walking, not elsewhere classified: Secondary | ICD-10-CM | POA: Diagnosis not present

## 2021-10-24 DIAGNOSIS — M25551 Pain in right hip: Secondary | ICD-10-CM | POA: Diagnosis not present

## 2021-10-24 DIAGNOSIS — M25562 Pain in left knee: Secondary | ICD-10-CM | POA: Diagnosis not present

## 2021-10-25 DIAGNOSIS — Z6822 Body mass index (BMI) 22.0-22.9, adult: Secondary | ICD-10-CM | POA: Diagnosis not present

## 2021-10-27 ENCOUNTER — Encounter: Payer: Self-pay | Admitting: Family Medicine

## 2021-10-30 ENCOUNTER — Encounter
Admission: RE | Disposition: A | Discharge status: Home / Self Care | Payer: Self-pay | Source: Home / Self Care | Attending: Gastroenterology

## 2021-10-30 ENCOUNTER — Ambulatory Visit: Payer: Medicare Other | Admitting: Registered Nurse

## 2021-10-30 ENCOUNTER — Ambulatory Visit
Admission: RE | Admit: 2021-10-30 | Discharge: 2021-10-30 | Disposition: A | Discharge status: Home / Self Care | Payer: Medicare Other | Attending: Gastroenterology | Admitting: Gastroenterology

## 2021-10-30 DIAGNOSIS — Z8616 Personal history of COVID-19: Secondary | ICD-10-CM | POA: Diagnosis not present

## 2021-10-30 DIAGNOSIS — E039 Hypothyroidism, unspecified: Secondary | ICD-10-CM | POA: Diagnosis not present

## 2021-10-30 DIAGNOSIS — E785 Hyperlipidemia, unspecified: Secondary | ICD-10-CM | POA: Diagnosis not present

## 2021-10-30 DIAGNOSIS — I10 Essential (primary) hypertension: Secondary | ICD-10-CM | POA: Insufficient documentation

## 2021-10-30 DIAGNOSIS — Z860100 Personal history of colon polyps, unspecified: Secondary | ICD-10-CM

## 2021-10-30 DIAGNOSIS — K317 Polyp of stomach and duodenum: Secondary | ICD-10-CM | POA: Insufficient documentation

## 2021-10-30 DIAGNOSIS — Z87891 Personal history of nicotine dependence: Secondary | ICD-10-CM | POA: Diagnosis not present

## 2021-10-30 DIAGNOSIS — J449 Chronic obstructive pulmonary disease, unspecified: Secondary | ICD-10-CM | POA: Diagnosis not present

## 2021-10-30 DIAGNOSIS — R1319 Other dysphagia: Secondary | ICD-10-CM | POA: Diagnosis not present

## 2021-10-30 DIAGNOSIS — K5790 Diverticulosis of intestine, part unspecified, without perforation or abscess without bleeding: Secondary | ICD-10-CM | POA: Diagnosis not present

## 2021-10-30 DIAGNOSIS — D122 Benign neoplasm of ascending colon: Secondary | ICD-10-CM | POA: Diagnosis not present

## 2021-10-30 DIAGNOSIS — R197 Diarrhea, unspecified: Secondary | ICD-10-CM

## 2021-10-30 DIAGNOSIS — D126 Benign neoplasm of colon, unspecified: Secondary | ICD-10-CM | POA: Diagnosis not present

## 2021-10-30 DIAGNOSIS — K635 Polyp of colon: Secondary | ICD-10-CM | POA: Insufficient documentation

## 2021-10-30 DIAGNOSIS — I2510 Atherosclerotic heart disease of native coronary artery without angina pectoris: Secondary | ICD-10-CM | POA: Diagnosis not present

## 2021-10-30 DIAGNOSIS — M810 Age-related osteoporosis without current pathological fracture: Secondary | ICD-10-CM | POA: Insufficient documentation

## 2021-10-30 DIAGNOSIS — J439 Emphysema, unspecified: Secondary | ICD-10-CM | POA: Diagnosis not present

## 2021-10-30 DIAGNOSIS — Z79899 Other long term (current) drug therapy: Secondary | ICD-10-CM | POA: Diagnosis not present

## 2021-10-30 DIAGNOSIS — K5730 Diverticulosis of large intestine without perforation or abscess without bleeding: Secondary | ICD-10-CM | POA: Insufficient documentation

## 2021-10-30 DIAGNOSIS — F419 Anxiety disorder, unspecified: Secondary | ICD-10-CM | POA: Insufficient documentation

## 2021-10-30 DIAGNOSIS — R1310 Dysphagia, unspecified: Secondary | ICD-10-CM | POA: Insufficient documentation

## 2021-10-30 DIAGNOSIS — I700 Atherosclerosis of aorta: Secondary | ICD-10-CM | POA: Insufficient documentation

## 2021-10-30 HISTORY — PX: COLONOSCOPY WITH PROPOFOL: SHX5780

## 2021-10-30 HISTORY — PX: ESOPHAGOGASTRODUODENOSCOPY: SHX5428

## 2021-10-30 SURGERY — COLONOSCOPY WITH PROPOFOL
Anesthesia: General

## 2021-10-30 MED ORDER — SODIUM CHLORIDE 0.9 % IV SOLN
INTRAVENOUS | Status: DC
  Administered 2021-10-30: 20 mL/h via INTRAVENOUS

## 2021-10-30 MED ORDER — DEXMEDETOMIDINE HCL IN NACL 80 MCG/20ML IV SOLN
INTRAVENOUS | Status: AC
  Filled 2021-10-30: qty 20

## 2021-10-30 MED ORDER — PROPOFOL 1000 MG/100ML IV EMUL
INTRAVENOUS | Status: AC
  Filled 2021-10-30: qty 100

## 2021-10-30 MED ORDER — PROPOFOL 10 MG/ML IV BOLUS
INTRAVENOUS | Status: DC | PRN
  Administered 2021-10-30 (×3): 10 mg via INTRAVENOUS
  Administered 2021-10-30: 70 mg via INTRAVENOUS

## 2021-10-30 MED ORDER — DEXMEDETOMIDINE HCL IN NACL 200 MCG/50ML IV SOLN
INTRAVENOUS | Status: DC | PRN
  Administered 2021-10-30: 8 ug via INTRAVENOUS

## 2021-10-30 MED ORDER — LIDOCAINE HCL (CARDIAC) PF 100 MG/5ML IV SOSY
PREFILLED_SYRINGE | INTRAVENOUS | Status: DC | PRN
  Administered 2021-10-30: 40 mg via INTRAVENOUS

## 2021-10-30 MED ORDER — PROPOFOL 500 MG/50ML IV EMUL
INTRAVENOUS | Status: DC | PRN
  Administered 2021-10-30: 150 ug/kg/min via INTRAVENOUS

## 2021-10-30 MED ORDER — GLYCOPYRROLATE 0.2 MG/ML IJ SOLN
INTRAMUSCULAR | Status: AC
  Filled 2021-10-30: qty 1

## 2021-10-30 MED ORDER — LIDOCAINE HCL (PF) 2 % IJ SOLN
INTRAMUSCULAR | Status: AC
  Filled 2021-10-30: qty 5

## 2021-10-30 MED ORDER — PHENYLEPHRINE 80 MCG/ML (10ML) SYRINGE FOR IV PUSH (FOR BLOOD PRESSURE SUPPORT)
PREFILLED_SYRINGE | INTRAVENOUS | Status: DC | PRN
  Administered 2021-10-30: 80 ug via INTRAVENOUS

## 2021-10-30 NOTE — Anesthesia Procedure Notes (Signed)
Date/Time: 10/30/2021 7:48 AM  Performed by: Doreen Salvage, CRNAPre-anesthesia Checklist: Patient identified, Emergency Drugs available, Suction available and Patient being monitored Patient Re-evaluated:Patient Re-evaluated prior to induction Oxygen Delivery Method: Nasal cannula Induction Type: IV induction Dental Injury: Teeth and Oropharynx as per pre-operative assessment  Comments: Nasal cannula with etCO2 monitoring

## 2021-10-30 NOTE — Anesthesia Postprocedure Evaluation (Signed)
Anesthesia Post Note  Patient: Barbara Lawson  Procedure(s) Performed: COLONOSCOPY WITH PROPOFOL ESOPHAGOGASTRODUODENOSCOPY (EGD)  Patient location during evaluation: PACU Anesthesia Type: General Level of consciousness: awake and oriented Pain management: pain level controlled Vital Signs Assessment: post-procedure vital signs reviewed and stable Respiratory status: spontaneous breathing and respiratory function stable Cardiovascular status: stable Anesthetic complications: no   No notable events documented.   Last Vitals:  Vitals:   10/30/21 0817 10/30/21 0827  BP: (!) 95/55 104/75  Pulse: 74 79  Resp: 18 17  Temp: (!) 36.1 C   SpO2: 99% 100%    Last Pain:  Vitals:   10/30/21 0827  TempSrc:   PainSc: 0-No pain                 VAN STAVEREN,Courtlyn Aki

## 2021-10-30 NOTE — Anesthesia Preprocedure Evaluation (Signed)
Anesthesia Evaluation  Patient identified by MRN, date of birth, ID band Patient awake    Reviewed: Allergy & Precautions, NPO status , Patient's Chart, lab work & pertinent test results  Airway Mallampati: III  TM Distance: >3 FB Neck ROM: Full    Dental  (+) Teeth Intact   Pulmonary neg pulmonary ROS, asthma , COPD,  COPD inhaler, Patient abstained from smoking., former smoker,    Pulmonary exam normal  + decreased breath sounds      Cardiovascular Exercise Tolerance: Good hypertension, Pt. on medications + CAD  negative cardio ROS Normal cardiovascular exam Rhythm:Regular Rate:Normal     Neuro/Psych Anxiety negative neurological ROS  negative psych ROS   GI/Hepatic negative GI ROS, Neg liver ROS,   Endo/Other  negative endocrine ROSHypothyroidism   Renal/GU negative Renal ROS  negative genitourinary   Musculoskeletal   Abdominal Normal abdominal exam  (+)   Peds negative pediatric ROS (+)  Hematology negative hematology ROS (+) Blood dyscrasia, anemia ,   Anesthesia Other Findings Past Medical History: 09/28/2009: ANEMIA-NOS No date: Aortic atherosclerosis ([place]) 09/28/2009: ASTHMA     Comment:  moderate persistent  No date: CAD (coronary artery disease) No date: Colon polyps No date: COPD (chronic obstructive pulmonary disease) ([place]) No date: COVID-19     Comment:  01/20/2020 No date: Emphysema lung ([facility])     Comment:  vs bronchitis;paraseptal  No date: Herpes No date: HPV in female     Comment:  hpv 20+ years  No date: Hyperlipemia 09/28/2009: HYPOTHYROIDISM No date: Lung nodule No date: Osteoporosis No date: Pneumonia     Comment:  hosp in 2018 No date: Thyroid nodule     Comment:  right thyroid 5.2 x 1.8 x 1.9 cm right lobe  Past Surgical History: No date: COLONOSCOPY  BMI    Body Mass Index: 22.81 kg/m      Reproductive/Obstetrics negative OB ROS                              Anesthesia Physical Anesthesia Plan  ASA: 3  Anesthesia Plan: General   Post-op Pain Management:    Induction:   PONV Risk Score and Plan: Propofol infusion and TIVA  Airway Management Planned: Natural Airway  Additional Equipment:   Intra-op Plan:   Post-operative Plan:   Informed Consent: I have reviewed the patients History and Physical, chart, labs and discussed the procedure including the risks, benefits and alternatives for the proposed anesthesia with the patient or authorized representative who has indicated his/her understanding and acceptance.     Dental Advisory Given  Plan Discussed with: CRNA and Surgeon  Anesthesia Plan Comments:         Anesthesia Quick Evaluation

## 2021-10-30 NOTE — Op Note (Signed)
[facility] Gastroenterology Patient Name: Barbara Lawson Procedure Date: 10/30/2021 7:08 AM MRN: [id number] Account #: [id number] Date of Birth: 1955-12-16 Admit Type: Outpatient Age: 66 Room: [facility] ENDO ROOM 1 Gender: Female Note Status: Finalized Instrument Name: Jasper Riling [id number] Procedure:             Colonoscopy Indications:           Surveillance: History of numerous (> 10) adenomas on                         last colonoscopy (< 3 yrs) Providers:             Jonathon Bellows MD, MD Referring MD:          Jonathon Bellows MD, MD (Referring MD), Libby Maw (Referring MD) Medicines:             Monitored Anesthesia Care Complications:         No immediate complications. Procedure:             Pre-Anesthesia Assessment:                        - Prior to the procedure, a History and Physical was                         performed, and patient medications, allergies and                         sensitivities were reviewed. The patient's tolerance                         of previous anesthesia was reviewed.                        - The risks and benefits of the procedure and the                         sedation options and risks were discussed with the                         patient. All questions were answered and informed                         consent was obtained.                        - ASA Grade Assessment: II - A patient with mild                         systemic disease.                        After obtaining informed consent, the colonoscope was                         passed under direct vision. Throughout the procedure,                         the  patient's blood pressure, pulse, and oxygen                         saturations were monitored continuously. The                         Colonoscope was introduced through the anus and                         advanced to the the cecum, identified by the                          appendiceal orifice. The colonoscopy was performed                         with ease. The patient tolerated the procedure well.                         The quality of the bowel preparation was excellent. Findings:      The perianal and digital rectal examinations were normal.      A 5 mm polyp was found in the ascending colon. The polyp was sessile.       The polyp was removed with a cold snare. Resection and retrieval were       complete.      Multiple small-mouthed diverticula were found in the sigmoid colon.      The exam was otherwise without abnormality on direct and retroflexion       views. Impression:            - One 5 mm polyp in the ascending colon, removed with                         a cold snare. Resected and retrieved.                        - Diverticulosis in the sigmoid colon.                        - The examination was otherwise normal on direct and                         retroflexion views. Recommendation:        - Discharge patient to home (with escort).                        - Resume previous diet.                        - Continue present medications.                        - Await pathology results.                        - Repeat colonoscopy in 5 years for surveillance. Procedure Code(s):     --- Professional ---                        [phone], Colonoscopy, flexible; with removal of  tumor(s), polyp(s), or other lesion(s) by snare                         technique Diagnosis Code(s):     --- Professional ---                        Z86.010, Personal history of colonic polyps                        K63.5, Polyp of colon                        K57.30, Diverticulosis of large intestine without                         perforation or abscess without bleeding CPT copyright 2019 American Medical Association. All rights reserved. The codes documented in this report are preliminary and upon coder review may  be revised to meet current compliance  requirements. Jonathon Bellows, MD Jonathon Bellows MD, MD 10/30/2021 8:16:25 AM This report has been signed electronically. Number of Addenda: 0 Note Initiated On: 10/30/2021 7:08 AM Scope Withdrawal Time: 0 hours 9 minutes 49 seconds  Total Procedure Duration: 0 hours 12 minutes 49 seconds  Estimated Blood Loss:  Estimated blood loss: none.      [facility]

## 2021-10-30 NOTE — H&P (Signed)
Jonathon Bellows, MD [address], [place], [place], [place], [address] [address] [place], [place], [place], [address] Phone: [phone]  Fax: [phone]  Primary Care Physician:  Libby Maw, MD   Pre-Procedure History & Physical: HPI:  Barbara Lawson is a 66 y.o. female is here for an endoscopy and colonoscopy    Past Medical History:  Diagnosis Date   ANEMIA-NOS 09/28/2009   Aortic atherosclerosis ([place])    ASTHMA 09/28/2009   moderate persistent    CAD (coronary artery disease)    Colon polyps    COPD (chronic obstructive pulmonary disease) ([place])    COVID-19    01/20/2020   Emphysema lung ([place])    vs bronchitis;paraseptal    Herpes    HPV in female    hpv 20+ years    Hyperlipemia    HYPOTHYROIDISM 09/28/2009   Lung nodule    Osteoporosis    Pneumonia    hosp in 2018   Thyroid nodule    right thyroid 5.2 x 1.8 x 1.9 cm right lobe    Past Surgical History:  Procedure Laterality Date   COLONOSCOPY      Prior to Admission medications   Medication Sig Start Date End Date Taking? Authorizing Provider  ARMOUR THYROID 60 MG tablet Take 60 mg by mouth every morning. 09/15/21  Yes [provider]  ascorbic acid (VITAMIN C) 1000 MG tablet Take by mouth.   Yes [provider]  atorvastatin (LIPITOR) 10 MG tablet Take 1 tablet (10 mg total) by mouth daily. 05/31/20  Yes McLean-Scocuzza, Nino Glow, MD  budesonide-formoterol [facility]) 80-4.5 MCG/ACT inhaler Inhale into the lungs. 09/14/19  Yes [provider]  cholecalciferol (VITAMIN D) 25 MCG (1000 UNIT) tablet Take 2,000 Units by mouth daily.   Yes [provider]  clonazePAM (KLONOPIN) 0.5 MG tablet Take 0.25 mg by mouth at bedtime. Outside prescriber holistic MD Dr. Cherre Blanc   Yes [provider]  cyclobenzaprine (FLEXERIL) 10 MG tablet Take 1 tablet (10 mg total) by mouth at bedtime as needed for muscle spasms. 01/05/21  Yes McLean-Scocuzza, Nino Glow, MD  denosumab (PROLIA) 60 MG/ML SOSY injection Inject 60 mg into the skin every 6 (six) months.   Yes [provider]  erythromycin ophthalmic ointment Place 1 application into the right eye 3 (three) times daily. X 4-7 days 07/01/20  Yes McLean-Scocuzza, Nino Glow, MD  hydrochlorothiazide (HYDRODIURIL) 25 MG tablet TAKE 1/2 TABLET BY MOUTH EVERY DAY 08/13/21  Yes Dutch Quint B, FNP  HYDROcodone-acetaminophen (NORCO) 7.5-325 MG tablet Take by mouth. From holistic MD Dr. Cherre Blanc   Yes [provider]  ipratropium (ATROVENT) 0.02 % nebulizer solution Take 500 mcg by nebulization 4 (four) times daily.   Yes [provider]  Ipratropium-Albuterol (COMBIVENT RESPIMAT) 20-100 MCG/ACT AERS respimat  01/25/16  Yes [provider]  Ipratropium-Albuterol (COMBIVENT RESPIMAT) 20-100 MCG/ACT AERS respimat Inhale 1 puff into the lungs every 6 (six) hours as needed for wheezing.   Yes [provider]  progesterone (PROMETRIUM) 200 MG capsule Take 200 mg by mouth at bedtime. 06/12/21  Yes [provider]  theophylline (THEODUR) 300 MG 12 hr tablet Take 1 tablet by mouth 2 (two) times daily. 07/17/21  Yes [provider]  valACYclovir (VALTREX) 1000 MG tablet 2 pills x 1 dose then X bid 3-7 days with outbreak until resolution 05/31/20  Yes McLean-Scocuzza, Nino Glow, MD  calcium citrate-vitamin D (CITRACAL+D) 315-200 MG-UNIT tablet Take  2 tablets by mouth daily. Patient not taking: Reported on 09/29/2021    [provider]  chlorhexidine (PERIDEX) 0.12 % solution PLEASE SEE ATTACHED FOR DETAILED DIRECTIONS Patient not taking: Reported on 09/29/2021 05/09/21   [provider]  omeprazole (PRILOSEC) 40 MG capsule Take 1 capsule by mouth daily. Patient not taking: Reported on 09/29/2021 06/23/21   [provider]    Allergies as of 08/24/2021 - Review Complete 08/23/2021  Allergen Reaction Noted   Sulfa antibiotics Rash and  Swelling 02/07/2015   Cat hair extract  06/24/2020   Dust mite extract  06/24/2020   Sulfonamide derivatives  09/28/2009    Family History  Problem Relation Age of Onset   Cancer Mother    Asthma Father    Cancer Father    Hyperlipidemia Father    Alzheimer's disease Father    Hyperlipidemia Brother     Social History   Socioeconomic History   Marital status: Married    Spouse name: Not on file   Number of children: Not on file   Years of education: Not on file   Highest education level: Not on file  Occupational History   Not on file  Tobacco Use   Smoking status: Former    Packs/day: 1.00    Types: Cigarettes    Quit date: 01/26/2013    Years since quitting: 8.7   Smokeless tobacco: Never  Vaping Use   Vaping Use: Some days  Substance and Sexual Activity   Alcohol use: Yes    Alcohol/week: 3.0 standard drinks of alcohol    Types: 3 Standard drinks or equivalent per week   Drug use: No   Sexual activity: Not Currently  Other Topics Concern   Not on file  Social History Narrative   Lives alone with dog    Moved from [place]    Bachelors degree massage therapy    Age 42 y.o abortion    lmp age 26    Social Determinants of Health   Financial Resource Strain: Low Risk  (08/11/2021)   Overall Financial Resource Strain (CARDIA)    Difficulty of Paying Living Expenses: Not hard at all  Food Insecurity: No Food Insecurity (08/11/2021)   Hunger Vital Sign    Worried About Running Out of Food in the Last Year: Never true    Ran Out of Food in the Last Year: Never true  Transportation Needs: No Transportation Needs (08/11/2021)   PRAPARE - Hydrologist (Medical): No    Lack of Transportation (Non-Medical): No  Physical Activity: Not on file  Stress: No Stress Concern Present (08/11/2021)   [place]    Feeling of Stress : Only a little  Social Connections: Not on file   Intimate Partner Violence: Not At Risk (08/11/2021)   Humiliation, Afraid, Rape, and Kick questionnaire    Fear of Current or Ex-Partner: No    Emotionally Abused: No    Physically Abused: No    Sexually Abused: No    Review of Systems: See HPI, otherwise negative ROS  Physical Exam: BP 110/79   Pulse 72   Temp 97.8 F (36.6 C) (Temporal)   Resp 20   Ht 5' 4.5" (1.638 m)   Wt 61.2 kg   SpO2 99%   BMI 22.81 kg/m  General:   Alert,  pleasant and cooperative in NAD Head:  Normocephalic and atraumatic. Neck:  Supple; no masses or thyromegaly.  Lungs:  Clear throughout to auscultation, normal respiratory effort.    Heart:  +S1, +S2, Regular rate and rhythm, No edema. Abdomen:  Soft, nontender and nondistended. Normal bowel sounds, without guarding, and without rebound.   Neurologic:  Alert and  oriented x4;  grossly normal neurologically.  Impression/Plan: Barbara Lawson is here for an endoscopy and colonoscopy  to be performed for  evaluation of dysphagia and colon cancer screening    Risks, benefits, limitations, and alternatives regarding endoscopy have been reviewed with the patient.  Questions have been answered.  All parties agreeable.   Jonathon Bellows, MD  10/30/2021, 7:46 AM

## 2021-10-30 NOTE — Op Note (Signed)
[facility] Gastroenterology Patient Name: Barbara Lawson Procedure Date: 10/30/2021 7:10 AM MRN: [id number] Account #: [id number] Date of Birth: 1955-08-26 Admit Type: Outpatient Age: 66 Room: [facility] ENDO ROOM 1 Gender: Female Note Status: Finalized Instrument Name: Altamese Cabal Endoscope [id number] Procedure:             Upper GI endoscopy Indications:           Dysphagia Providers:             Jonathon Bellows MD, MD Referring MD:          Jonathon Bellows MD, MD (Referring MD), Libby Maw (Referring MD) Medicines:             Monitored Anesthesia Care Complications:         No immediate complications. Procedure:             Pre-Anesthesia Assessment:                        - Prior to the procedure, a History and Physical was                         performed, and patient medications, allergies and                         sensitivities were reviewed. The patient's tolerance                         of previous anesthesia was reviewed.                        - The risks and benefits of the procedure and the                         sedation options and risks were discussed with the                         patient. All questions were answered and informed                         consent was obtained.                        - ASA Grade Assessment: II - A patient with mild                         systemic disease.                        After obtaining informed consent, the endoscope was                         passed under direct vision. Throughout the procedure,                         the patient's blood pressure, pulse, and oxygen                         saturations were monitored  continuously. The Endoscope                         was introduced through the mouth, and advanced to the                         third part of duodenum. The upper GI endoscopy was                         accomplished with ease. The patient tolerated the                          procedure well. Findings:      The examined esophagus was normal. Biopsies were taken with a cold       forceps for histology.      A single 5 mm sessile polyp with no stigmata of recent bleeding was       found in the cardia. The polyp was removed with a cold biopsy forceps.       Resection and retrieval were complete.      The examined duodenum was normal.      The cardia and gastric fundus were normal on retroflexion. Impression:            - Normal esophagus. Biopsied.                        - A single gastric polyp. Resected and retrieved.                        - Normal examined duodenum. Recommendation:        - Await pathology results.                        - Perform a colonoscopy today. Procedure Code(s):     --- Professional ---                        [phone], Esophagogastroduodenoscopy, flexible,                         transoral; with biopsy, single or multiple Diagnosis Code(s):     --- Professional ---                        K31.7, Polyp of stomach and duodenum                        R13.10, Dysphagia, unspecified CPT copyright 2019 American Medical Association. All rights reserved. The codes documented in this report are preliminary and upon coder review may  be revised to meet current compliance requirements. Jonathon Bellows, MD Jonathon Bellows MD, MD 10/30/2021 7:58:58 AM This report has been signed electronically. Number of Addenda: 0 Note Initiated On: 10/30/2021 7:10 AM Estimated Blood Loss:  Estimated blood loss: none.      [facility]

## 2021-10-30 NOTE — Transfer of Care (Signed)
Immediate Anesthesia Transfer of Care Note  Patient: Barbara Lawson  Procedure(s) Performed: COLONOSCOPY WITH PROPOFOL ESOPHAGOGASTRODUODENOSCOPY (EGD)  Patient Location: PACU  Anesthesia Type:General  Level of Consciousness: awake, alert  and oriented  Airway & Oxygen Therapy: Patient Spontanous Breathing  Post-op Assessment: Report given to RN and Post -op Vital signs reviewed and stable  Post vital signs: Reviewed and stable  Last Vitals:  Vitals Value Taken Time  BP 95/55 10/30/21 0818  Temp    Pulse 75 10/30/21 0818  Resp 17 10/30/21 0818  SpO2 100 % 10/30/21 0818  Vitals shown include unvalidated device data.  Last Pain:  Vitals:   10/30/21 0708  TempSrc: Temporal  PainSc: 0-No pain         Complications: No notable events documented.

## 2021-10-31 ENCOUNTER — Encounter: Payer: Self-pay | Admitting: Gastroenterology

## 2021-10-31 LAB — SURGICAL PATHOLOGY

## 2021-11-06 DIAGNOSIS — M25562 Pain in left knee: Secondary | ICD-10-CM | POA: Diagnosis not present

## 2021-11-06 DIAGNOSIS — R262 Difficulty in walking, not elsewhere classified: Secondary | ICD-10-CM | POA: Diagnosis not present

## 2021-11-06 DIAGNOSIS — M25551 Pain in right hip: Secondary | ICD-10-CM | POA: Diagnosis not present

## 2021-11-15 DIAGNOSIS — J4540 Moderate persistent asthma, uncomplicated: Secondary | ICD-10-CM | POA: Diagnosis not present

## 2021-11-15 DIAGNOSIS — J4520 Mild intermittent asthma, uncomplicated: Secondary | ICD-10-CM | POA: Diagnosis not present

## 2021-11-15 DIAGNOSIS — J4521 Mild intermittent asthma with (acute) exacerbation: Secondary | ICD-10-CM | POA: Diagnosis not present

## 2021-11-15 DIAGNOSIS — Z87891 Personal history of nicotine dependence: Secondary | ICD-10-CM | POA: Diagnosis not present

## 2021-11-15 DIAGNOSIS — I10 Essential (primary) hypertension: Secondary | ICD-10-CM | POA: Diagnosis not present

## 2021-11-15 DIAGNOSIS — J4530 Mild persistent asthma, uncomplicated: Secondary | ICD-10-CM | POA: Diagnosis not present

## 2021-11-15 DIAGNOSIS — Z823 Family history of stroke: Secondary | ICD-10-CM | POA: Diagnosis not present

## 2021-11-15 DIAGNOSIS — J438 Other emphysema: Secondary | ICD-10-CM | POA: Diagnosis not present

## 2021-11-15 DIAGNOSIS — Z882 Allergy status to sulfonamides status: Secondary | ICD-10-CM | POA: Diagnosis not present

## 2021-11-15 DIAGNOSIS — I2510 Atherosclerotic heart disease of native coronary artery without angina pectoris: Secondary | ICD-10-CM | POA: Diagnosis not present

## 2021-11-15 DIAGNOSIS — F17200 Nicotine dependence, unspecified, uncomplicated: Secondary | ICD-10-CM | POA: Diagnosis not present

## 2021-11-30 DIAGNOSIS — M25551 Pain in right hip: Secondary | ICD-10-CM | POA: Diagnosis not present

## 2021-11-30 DIAGNOSIS — R262 Difficulty in walking, not elsewhere classified: Secondary | ICD-10-CM | POA: Diagnosis not present

## 2021-11-30 DIAGNOSIS — M25562 Pain in left knee: Secondary | ICD-10-CM | POA: Diagnosis not present

## 2021-12-01 DIAGNOSIS — E042 Nontoxic multinodular goiter: Secondary | ICD-10-CM | POA: Diagnosis not present

## 2021-12-01 DIAGNOSIS — E063 Autoimmune thyroiditis: Secondary | ICD-10-CM | POA: Diagnosis not present

## 2021-12-01 DIAGNOSIS — J449 Chronic obstructive pulmonary disease, unspecified: Secondary | ICD-10-CM | POA: Diagnosis not present

## 2021-12-01 DIAGNOSIS — M810 Age-related osteoporosis without current pathological fracture: Secondary | ICD-10-CM | POA: Diagnosis not present

## 2021-12-04 DIAGNOSIS — R5383 Other fatigue: Secondary | ICD-10-CM | POA: Diagnosis not present

## 2021-12-04 DIAGNOSIS — M26629 Arthralgia of temporomandibular joint, unspecified side: Secondary | ICD-10-CM | POA: Diagnosis not present

## 2021-12-04 DIAGNOSIS — E039 Hypothyroidism, unspecified: Secondary | ICD-10-CM | POA: Diagnosis not present

## 2021-12-04 DIAGNOSIS — M810 Age-related osteoporosis without current pathological fracture: Secondary | ICD-10-CM | POA: Diagnosis not present

## 2021-12-04 DIAGNOSIS — E782 Mixed hyperlipidemia: Secondary | ICD-10-CM | POA: Diagnosis not present

## 2021-12-07 DIAGNOSIS — R262 Difficulty in walking, not elsewhere classified: Secondary | ICD-10-CM | POA: Diagnosis not present

## 2021-12-07 DIAGNOSIS — M25551 Pain in right hip: Secondary | ICD-10-CM | POA: Diagnosis not present

## 2021-12-07 DIAGNOSIS — M25562 Pain in left knee: Secondary | ICD-10-CM | POA: Diagnosis not present

## 2022-01-08 ENCOUNTER — Encounter: Payer: Self-pay | Admitting: Nurse Practitioner

## 2022-01-08 ENCOUNTER — Telehealth: Payer: Self-pay | Admitting: *Deleted

## 2022-01-08 ENCOUNTER — Telehealth (INDEPENDENT_AMBULATORY_CARE_PROVIDER_SITE_OTHER): Payer: Medicare Other | Admitting: Nurse Practitioner

## 2022-01-08 DIAGNOSIS — U071 COVID-19: Secondary | ICD-10-CM | POA: Diagnosis not present

## 2022-01-08 MED ORDER — NIRMATRELVIR/RITONAVIR (PAXLOVID)TABLET: 3.0000 | ORAL_TABLET | Freq: Two times a day (BID) | ORAL | 0 refills | Status: AC

## 2022-01-08 NOTE — Patient Instructions (Signed)
Start paxlovid 3 tablets twice a day for 5 days. Do not take your atorvastatin while taking the paxlovid.   Get plenty of rest and drink fluids. Wear a mask until 01/14/22.

## 2022-01-08 NOTE — Assessment & Plan Note (Signed)
Symptoms started 4 days ago, will treat with Paxlovid twice a day for 5 days.  Discussed possible side effects.  Encourage rest, fluids.  She can continue NyQuil as needed.  Reviewed home care instructions for COVID. Advised self-isolation at home for at least 5 days. After 5 days, if improved and fever resolved, can be in public, but should wear a mask around others for an additional 5 days. If symptoms, esp, dyspnea develops/worsens, recommend in-person evaluation at either an urgent care or the emergency room.

## 2022-01-08 NOTE — Telephone Encounter (Signed)
Left message for patient to return call to office to go over information before appointment.

## 2022-01-08 NOTE — Progress Notes (Signed)
[name] [address] [place] [address] Dept: [phone] Dept Fax: [phone]  Virtual Video Visit  I connected with Barbara Lawson on 01/08/22 at 11:20 AM EST by a video enabled telemedicine application and verified that I am speaking with the correct person using two identifiers.  Location patient: Home Location provider: Clinic Persons participating in the virtual visit: Patient; Barbara Peper, NP; Docia Barrier, CMA  I discussed the limitations of evaluation and management by telemedicine and the availability of in person appointments. The patient expressed understanding and agreed to proceed.  Chief Complaint  Patient presents with   Covid Positive    Tested positive for covid on yesterday Sx started Friday   Cough   Generalized Body Aches   Nasal Congestion   Asthma   Headache    SUBJECTIVE:  HPI: Barbara Lawson is a 66 y.o. female who presents with cough, and congestion which started on Friday, 4 days ago. She tested positive for covid-19 yesterday.   UPPER RESPIRATORY TRACT INFECTION  Fever: yes - at the beginning Cough: yes - productive Shortness of breath: yes intermittent Wheezing: yes - intermittent Chest pain: no Chest tightness: yes Chest congestion: yes Nasal congestion: yes Runny nose: yes Post nasal drip: yes Sneezing: yes Sore throat: yes at the beginning Swollen glands: no Sinus pressure: yes Headache: yes Face pain: no Toothache: no Ear pain: no bilateral Ear pressure: no bilateral Eyes red/itching:no Eye drainage/crusting: no  Vomiting: no Rash: no Fatigue: yes Sick contacts: no Strep contacts: no  Context: stable Recurrent sinusitis: no Relief with OTC cold/cough medications: yes  Treatments attempted: nyquil   Patient Active Problem List   Diagnosis Date Noted   COVID-19 01/08/2022   History of colonic polyps    Other dysphagia    Need for influenza  vaccination 09/29/2021   Need for RSV immunization 09/29/2021   Mid back pain 01/05/2021   Cervicalgia 01/05/2021   Scoliosis 01/05/2021   Bilateral carotid artery stenosis 10/03/2020   Benign essential HTN 08/09/2020   Tubular adenoma of colon 08/04/2020   Anxiety and depression 07/01/2020   Chronic pain syndrome 07/01/2020   Pain of right hip 07/01/2020   Chronic low back pain 07/01/2020   Left knee pain 07/01/2020   Cyst of left ovary 06/28/2020   Elevated lipoprotein(a) 06/28/2020   Herpes 06/28/2020   Thyroiditis 06/28/2020   Former smoker 06/28/2020   Calciuria 06/28/2020   Hypertension 06/28/2020   HPV in female    COPD (chronic obstructive pulmonary disease) ([facility])    Osteoporosis    Lung nodules    Pneumonia    CAD (coronary artery disease)    Thyroid nodule    Aortic atherosclerosis ([facility])    Colon polyps    Familial hyperlipidemia    Chronic obstructive pulmonary disease ([place]) 05/31/2020   Coronary artery disease involving native coronary artery of native heart without angina pectoris 05/31/2020   Idiopathic hypercalciuria 01/06/2020   Right ankle sprain 12/03/2018   Increased band cell count 12/15/2016   Anxiety 02/03/2016   Healthcare maintenance 12/27/2015   Cough 10/03/2015   Hoarseness 10/03/2015   History of herpes labialis 09/06/2015   Symptomatic postsurgical menopause 02/16/2015   Vaginal atrophy 02/16/2015   Hypothyroidism 09/28/2009   ANEMIA-NOS 09/28/2009   Asthma 09/28/2009   Moderate persistent asthma without complication 50/53/9767    Past Surgical History:  Procedure Laterality Date   COLONOSCOPY     COLONOSCOPY WITH PROPOFOL N/A 10/30/2021   Procedure: COLONOSCOPY  WITH PROPOFOL;  Surgeon: Jonathon Bellows, MD;  Location[facility] ENDOSCOPY;  Service: Gastroenterology;  Laterality: N/A;   ESOPHAGOGASTRODUODENOSCOPY N/A 10/30/2021   Procedure: ESOPHAGOGASTRODUODENOSCOPY (EGD);  Surgeon: Jonathon Bellows, MD;  Location: [facility] ENDOSCOPY;  Service:  Gastroenterology;  Laterality: N/A;    Family History  Problem Relation Age of Onset   Cancer Mother    Asthma Father    Cancer Father    Hyperlipidemia Father    Alzheimer's disease Father    Hyperlipidemia Brother     Social History   Tobacco Use   Smoking status: Former    Packs/day: 1.00    Types: Cigarettes    Quit date: 01/26/2013    Years since quitting: 8.9   Smokeless tobacco: Never  Vaping Use   Vaping Use: Some days  Substance Use Topics   Alcohol use: Yes    Alcohol/week: 3.0 standard drinks of alcohol    Types: 3 Standard drinks or equivalent per week   Drug use: No     Current Outpatient Medications:    ARMOUR THYROID 30 MG tablet, Take 30 mg by mouth every morning., Disp: , Rfl:    ARMOUR THYROID 60 MG tablet, Take 60 mg by mouth every morning., Disp: , Rfl:    ascorbic acid (VITAMIN C) 1000 MG tablet, Take by mouth., Disp: , Rfl:    atorvastatin (LIPITOR) 10 MG tablet, Take 1 tablet (10 mg total) by mouth daily., Disp: 90 tablet, Rfl: 3   budesonide-formoterol (SYMBICORT) 80-4.5 MCG/ACT inhaler, Inhale into the lungs., Disp: , Rfl:    calcium citrate-vitamin D (CITRACAL+D) 315-200 MG-UNIT tablet, Take 2 tablets by mouth daily., Disp: , Rfl:    chlorhexidine (PERIDEX) 0.12 % solution, , Disp: , Rfl:    cholecalciferol (VITAMIN D) 25 MCG (1000 UNIT) tablet, Take 2,000 Units by mouth daily., Disp: , Rfl:    clonazePAM (KLONOPIN) 0.5 MG tablet, Take 0.25 mg by mouth at bedtime. Outside prescriber holistic MD Dr. Cherre Blanc, Disp: , Rfl:    cyclobenzaprine (FLEXERIL) 10 MG tablet, Take 1 tablet (10 mg total) by mouth at bedtime as needed for muscle spasms., Disp: 90 tablet, Rfl: 3   denosumab (PROLIA) 60 MG/ML SOSY injection, Inject 60 mg into the skin every 6 (six) months., Disp: , Rfl:    erythromycin ophthalmic ointment, Place 1 application into the right eye 3 (three) times daily. X 4-7 days, Disp: 3.5 g, Rfl: 0   Fluticasone-Umeclidin-Vilant  (TRELEGY ELLIPTA) 200-62.5-25 MCG/ACT AEPB, Inhale into the lungs., Disp: , Rfl:    hydrochlorothiazide (HYDRODIURIL) 25 MG tablet, TAKE 1/2 TABLET BY MOUTH EVERY DAY, Disp: 45 tablet, Rfl: 3   HYDROcodone-acetaminophen (NORCO) 7.5-325 MG tablet, Take by mouth. From holistic MD Dr. Cherre Blanc, Disp: , Rfl:    ipratropium (ATROVENT) 0.02 % nebulizer solution, Take 500 mcg by nebulization 4 (four) times daily., Disp: , Rfl:    Ipratropium-Albuterol (COMBIVENT RESPIMAT) 20-100 MCG/ACT AERS respimat, Inhale 1 puff into the lungs every 6 (six) hours as needed for wheezing., Disp: , Rfl:    nirmatrelvir/ritonavir EUA (PAXLOVID) 20 x 150 MG & 10 x '100MG'$  TABS, Take 3 tablets by mouth 2 (two) times daily for 5 days. (Take nirmatrelvir 150 mg two tablets twice daily for 5 days and ritonavir 100 mg one tablet twice daily for 5 days) Patient GFR is 84, Disp: 30 tablet, Rfl: 0   omeprazole (PRILOSEC) 40 MG capsule, Take 1 capsule by mouth daily., Disp: , Rfl:    progesterone (PROMETRIUM) 200 MG capsule,  Take 200 mg by mouth at bedtime., Disp: , Rfl:    theophylline (THEODUR) 300 MG 12 hr tablet, Take 1 tablet by mouth 2 (two) times daily., Disp: , Rfl:    valACYclovir (VALTREX) 1000 MG tablet, 2 pills x 1 dose then X bid 3-7 days with outbreak until resolution, Disp: 90 tablet, Rfl: 3  Allergies  Allergen Reactions   Sulfa Antibiotics Rash and Swelling    Patient Reports It Only Occurred with The Suppository Formulation  Patient Reports It Only Occurred with The Suppository Formulation Patient Reports It Only Occurred with The Suppository Formulation Patient Reports It Only Occurred with The Suppository Formulation  Patient Reports It Only Occurred with The Suppository Formulation  Patient Reports It Only Occurred with The Suppository Formulation Patient Reports It Only Occurred with The Suppository Formulation Patient Reports It Only Occurred with The Suppository Formulation    Patient Reports It  Only Occurred with The Suppository Formulation Patient Reports It Only Occurred with The Suppository Formulation Patient Reports It Only Occurred with The Suppository Formulation    Patient Reports It Only Occurred with The Suppository Formulation   Cat Hair Extract    Dust Mite Extract     Other Reaction(s): Other (See Comments)  Bothers asthma     Bothers asthma   Sulfonamide Derivatives     ROS: See pertinent positives and negatives per HPI.  OBSERVATIONS/OBJECTIVE:  VITALS per patient if applicable: There were no vitals filed for this visit. There is no height or weight on file to calculate BMI.    GENERAL: Alert and oriented. Appears well and in no acute distress.  HEENT: Atraumatic. Conjunctiva clear. No obvious abnormalities on inspection of external nose and ears.  NECK: Normal movements of the head and neck.  LUNGS: On inspection, no signs of respiratory distress. Breathing rate appears normal. No obvious gross SOB, gasping or wheezing, and no conversational dyspnea.  CV: No obvious cyanosis.  MS: Moves all visible extremities without noticeable abnormality.  PSYCH/NEURO: Pleasant and cooperative. No obvious depression or anxiety. Speech and thought processing grossly intact.  ASSESSMENT AND PLAN:  Problem List Items Addressed This Visit       Other   COVID-19 - Primary    Symptoms started 4 days ago, will treat with Paxlovid twice a day for 5 days.  Discussed possible side effects.  Encourage rest, fluids.  She can continue NyQuil as needed.  Reviewed home care instructions for COVID. Advised self-isolation at home for at least 5 days. After 5 days, if improved and fever resolved, can be in public, but should wear a mask around others for an additional 5 days. If symptoms, esp, dyspnea develops/worsens, recommend in-person evaluation at either an urgent care or the emergency room.       Relevant Medications   nirmatrelvir/ritonavir EUA (PAXLOVID) 20 x 150  MG & 10 x '100MG'$  TABS     I discussed the assessment and treatment plan with the patient. The patient was provided an opportunity to ask questions and all were answered. The patient agreed with the plan and demonstrated an understanding of the instructions.   The patient was advised to call back or seek an in-person evaluation if the symptoms worsen or if the condition fails to improve as anticipated.   Charyl Dancer, NP

## 2022-01-19 ENCOUNTER — Encounter: Payer: Self-pay | Admitting: Gastroenterology

## 2022-02-06 DIAGNOSIS — E039 Hypothyroidism, unspecified: Secondary | ICD-10-CM | POA: Diagnosis not present

## 2022-02-06 DIAGNOSIS — E063 Autoimmune thyroiditis: Secondary | ICD-10-CM | POA: Diagnosis not present

## 2022-02-06 DIAGNOSIS — M810 Age-related osteoporosis without current pathological fracture: Secondary | ICD-10-CM | POA: Diagnosis not present

## 2022-02-06 DIAGNOSIS — E042 Nontoxic multinodular goiter: Secondary | ICD-10-CM | POA: Diagnosis not present

## 2022-02-06 DIAGNOSIS — R82994 Hypercalciuria: Secondary | ICD-10-CM | POA: Diagnosis not present

## 2022-02-12 DIAGNOSIS — R911 Solitary pulmonary nodule: Secondary | ICD-10-CM | POA: Diagnosis not present

## 2022-02-12 DIAGNOSIS — Z122 Encounter for screening for malignant neoplasm of respiratory organs: Secondary | ICD-10-CM | POA: Diagnosis not present

## 2022-02-12 DIAGNOSIS — Z87891 Personal history of nicotine dependence: Secondary | ICD-10-CM | POA: Diagnosis not present

## 2022-02-13 DIAGNOSIS — E039 Hypothyroidism, unspecified: Secondary | ICD-10-CM | POA: Diagnosis not present

## 2022-02-13 DIAGNOSIS — M810 Age-related osteoporosis without current pathological fracture: Secondary | ICD-10-CM | POA: Diagnosis not present

## 2022-02-13 DIAGNOSIS — E063 Autoimmune thyroiditis: Secondary | ICD-10-CM | POA: Diagnosis not present

## 2022-02-13 DIAGNOSIS — J449 Chronic obstructive pulmonary disease, unspecified: Secondary | ICD-10-CM | POA: Diagnosis not present

## 2022-03-09 DIAGNOSIS — R7989 Other specified abnormal findings of blood chemistry: Secondary | ICD-10-CM | POA: Diagnosis not present

## 2022-03-09 DIAGNOSIS — R5383 Other fatigue: Secondary | ICD-10-CM | POA: Diagnosis not present

## 2022-03-09 DIAGNOSIS — R519 Headache, unspecified: Secondary | ICD-10-CM | POA: Diagnosis not present

## 2022-03-09 DIAGNOSIS — K5901 Slow transit constipation: Secondary | ICD-10-CM | POA: Diagnosis not present

## 2022-03-09 DIAGNOSIS — E039 Hypothyroidism, unspecified: Secondary | ICD-10-CM | POA: Diagnosis not present

## 2022-03-29 ENCOUNTER — Encounter: Payer: Self-pay | Admitting: Family Medicine

## 2022-03-29 ENCOUNTER — Ambulatory Visit (INDEPENDENT_AMBULATORY_CARE_PROVIDER_SITE_OTHER): Payer: Medicare Other | Admitting: Family Medicine

## 2022-03-29 VITALS — BP 118/68 | HR 81 | Temp 98.1°F | Ht 64.5 in | Wt 133.4 lb

## 2022-03-29 DIAGNOSIS — J432 Centrilobular emphysema: Secondary | ICD-10-CM

## 2022-03-29 DIAGNOSIS — M25551 Pain in right hip: Secondary | ICD-10-CM

## 2022-03-29 DIAGNOSIS — D1723 Benign lipomatous neoplasm of skin and subcutaneous tissue of right leg: Secondary | ICD-10-CM

## 2022-03-29 DIAGNOSIS — M7652 Patellar tendinitis, left knee: Secondary | ICD-10-CM | POA: Diagnosis not present

## 2022-03-29 DIAGNOSIS — J4520 Mild intermittent asthma, uncomplicated: Secondary | ICD-10-CM | POA: Diagnosis not present

## 2022-03-29 NOTE — Progress Notes (Signed)
Established Patient Office Visit   Subjective:  Patient ID: Barbara Lawson, female    DOB: 14-Oct-1955  Age: 67 y.o. MRN: [id number]  Chief Complaint  Patient presents with   Hip Pain   Knee Pain    6 month follow-up    Follow-up    Hip Pain  Pertinent negatives include no tingling.  Knee Pain  Pertinent negatives include no tingling.   Encounter Diagnoses  Name Primary?   Pain of right hip Yes   Patellar tendinitis of left knee    Lipoma of right lower extremity    Mild intermittent asthma without complication    Centrilobular emphysema (HCC)    For follow-up out of concern for persisting hematoma in right lower back area status post fall in the early part of January.  She slipped coming down some steps.  There is ongoing right anterior hip pain.  She has worked with physical therapy and does not feel as though it has been helpful.  Ongoing left knee pain again feels as though physical therapy has not been helpful.  History of asthma and centrilobular COPD.  She needs a new pulmonologist and would like to see Dr. Idamae Schuller in [place].    Review of Systems  Constitutional: Negative.   HENT: Negative.    Eyes:  Negative for blurred vision, discharge and redness.  Respiratory: Negative.    Cardiovascular: Negative.   Gastrointestinal:  Negative for abdominal pain.  Genitourinary: Negative.   Musculoskeletal:  Positive for joint pain. Negative for myalgias.  Skin:  Negative for rash.  Neurological:  Negative for tingling, loss of consciousness and weakness.  Endo/Heme/Allergies:  Negative for polydipsia.     Current Outpatient Medications:    ARMOUR THYROID 30 MG tablet, Take 30 mg by mouth every morning., Disp: , Rfl:    ARMOUR THYROID 60 MG tablet, Take 60 mg by mouth every morning., Disp: , Rfl:    ascorbic acid (VITAMIN C) 1000 MG tablet, Take by mouth., Disp: , Rfl:    atorvastatin (LIPITOR) 10 MG tablet, Take 1 tablet (10 mg total) by mouth daily., Disp: 90 tablet,  Rfl: 3   budesonide-formoterol (SYMBICORT) 80-4.5 MCG/ACT inhaler, Inhale into the lungs., Disp: , Rfl:    calcium citrate-vitamin D (CITRACAL+D) 315-200 MG-UNIT tablet, Take 2 tablets by mouth daily., Disp: , Rfl:    cholecalciferol (VITAMIN D) 25 MCG (1000 UNIT) tablet, Take 2,000 Units by mouth daily., Disp: , Rfl:    clonazePAM (KLONOPIN) 0.5 MG tablet, Take 0.25 mg by mouth at bedtime. Outside prescriber holistic MD Dr. Cherre Blanc, Disp: , Rfl:    cyclobenzaprine (FLEXERIL) 10 MG tablet, Take 1 tablet (10 mg total) by mouth at bedtime as needed for muscle spasms., Disp: 90 tablet, Rfl: 3   denosumab (PROLIA) 60 MG/ML SOSY injection, Inject 60 mg into the skin every 6 (six) months., Disp: , Rfl:    Fluticasone-Umeclidin-Vilant (TRELEGY ELLIPTA) 200-62.5-25 MCG/ACT AEPB, Inhale into the lungs., Disp: , Rfl:    HYDROcodone-acetaminophen (NORCO) 7.5-325 MG tablet, Take by mouth. From holistic MD Dr. Cherre Blanc, Disp: , Rfl:    ipratropium (ATROVENT) 0.02 % nebulizer solution, Take 500 mcg by nebulization 4 (four) times daily., Disp: , Rfl:    Ipratropium-Albuterol (COMBIVENT RESPIMAT) 20-100 MCG/ACT AERS respimat, Inhale 1 puff into the lungs every 6 (six) hours as needed for wheezing., Disp: , Rfl:    progesterone (PROMETRIUM) 200 MG capsule, Take 300 mg by mouth at bedtime., Disp: , Rfl:    theophylline (  THEODUR) 300 MG 12 hr tablet, Take 1.5 tablets by mouth 2 (two) times daily., Disp: , Rfl:    valACYclovir (VALTREX) 1000 MG tablet, 2 pills x 1 dose then X bid 3-7 days with outbreak until resolution, Disp: 90 tablet, Rfl: 3   chlorhexidine (PERIDEX) 0.12 % solution, , Disp: , Rfl:    erythromycin ophthalmic ointment, Place 1 application into the right eye 3 (three) times daily. X 4-7 days (Patient not taking: Reported on 03/29/2022), Disp: 3.5 g, Rfl: 0   hydrochlorothiazide (HYDRODIURIL) 25 MG tablet, TAKE 1/2 TABLET BY MOUTH EVERY DAY, Disp: 45 tablet, Rfl: 3   omeprazole  (PRILOSEC) 40 MG capsule, Take 1 capsule by mouth daily. (Patient not taking: Reported on 03/29/2022), Disp: , Rfl:    Objective:     BP 118/68 (BP Location: Right Arm, Patient Position: Sitting)   Pulse 81   Temp 98.1 F (36.7 C) (Temporal)   Ht 5' 4.5" (1.638 m)   Wt 133 lb 6.4 oz (60.5 kg)   SpO2 99%   BMI 22.54 kg/m    Physical Exam Constitutional:      General: She is not in acute distress.    Appearance: Normal appearance. She is not ill-appearing, toxic-appearing or diaphoretic.  HENT:     Head: Normocephalic and atraumatic.     Right Ear: External ear normal.     Left Ear: External ear normal.  Eyes:     General: No scleral icterus.       Right eye: No discharge.        Left eye: No discharge.     Extraocular Movements: Extraocular movements intact.     Conjunctiva/sclera: Conjunctivae normal.  Pulmonary:     Effort: Pulmonary effort is normal. No respiratory distress.  Musculoskeletal:     Right hip: Normal range of motion. Normal strength.     Left hip: Normal range of motion. Normal strength.     Left knee: Swelling present. No effusion or bony tenderness. Normal range of motion. Normal patellar mobility.       Legs:  Skin:    General: Skin is warm and dry.  Neurological:     Mental Status: She is alert and oriented to person, place, and time.  Psychiatric:        Mood and Affect: Mood normal.        Behavior: Behavior normal.      No results found for any visits on 03/29/22.    The 10-year ASCVD risk score (Arnett DK, et al., 2019) is: 6.4%    Assessment & Plan:   Pain of right hip -     Ambulatory referral to Sports Medicine  Patellar tendinitis of left knee -     Ambulatory referral to Sports Medicine  Lipoma of right lower extremity -     Ambulatory referral to General Surgery  Mild intermittent asthma without complication -     Ambulatory referral to Pulmonology  Centrilobular emphysema ([facility] [facility]) -     Ambulatory referral to  Pulmonology    Return in about 6 months (around 09/29/2022).    Libby Maw, MD

## 2022-04-03 ENCOUNTER — Ambulatory Visit
Admission: RE | Admit: 2022-04-03 | Discharge: 2022-04-03 | Disposition: A | Discharge status: Home / Self Care | Payer: Medicare Other | Source: Ambulatory Visit | Attending: Sports Medicine | Admitting: Sports Medicine

## 2022-04-03 ENCOUNTER — Ambulatory Visit: Payer: Medicare Other | Admitting: Sports Medicine

## 2022-04-03 VITALS — BP 106/70 | Ht 64.5 in | Wt 133.0 lb

## 2022-04-03 DIAGNOSIS — M1811 Unilateral primary osteoarthritis of first carpometacarpal joint, right hand: Secondary | ICD-10-CM | POA: Diagnosis not present

## 2022-04-03 DIAGNOSIS — G8929 Other chronic pain: Secondary | ICD-10-CM

## 2022-04-03 DIAGNOSIS — M25551 Pain in right hip: Secondary | ICD-10-CM

## 2022-04-03 DIAGNOSIS — M25562 Pain in left knee: Secondary | ICD-10-CM

## 2022-04-04 DIAGNOSIS — F17200 Nicotine dependence, unspecified, uncomplicated: Secondary | ICD-10-CM | POA: Diagnosis not present

## 2022-04-04 DIAGNOSIS — J4540 Moderate persistent asthma, uncomplicated: Secondary | ICD-10-CM | POA: Diagnosis not present

## 2022-04-04 NOTE — Progress Notes (Signed)
Subjective:    Patient ID: Barbara Lawson, female    DOB: [birth year], 67 y.o.   MRN: [id number]  HPI chief complaint: Right hip and left knee pain  Barbara Lawson is a very pleasant 67 year old active female that presents today with a couple of different complaints.  First complaint is right hip pain has been present for "years".  She experiences intermittent pain that she localizes to the right groin.  Pain will sometimes radiate to the posterior aspect of the hip as well.  She describes an achy discomfort along with occasional catching in the hip.  She has tried physical therapy but it actually made her symptoms worse.  She did recently fall suffering a hematoma to the posterior hip but that is unrelated to her chronic hip pain.  The hematoma is slowly resolving.  No prior hip surgeries.  She is also experiencing intermittent left knee pain which has been present for the past 2 years.  She does have some associated swelling with this.  Few nights ago she woke up with pain and swelling which resolved with a compression sleeve.  She also has a double upright brace that she wears when she is on more uneven ground.  No known trauma to the knee.  She has been told in the past that she has arthritis.  No prior knee surgeries.  She does take an occasional over-the-counter ibuprofen which does help when she takes it.  Past medical history reviewed Medications reviewed Allergies reviewed    Review of Systems As above    Objective:   Physical Exam  Well-developed, fit appearing.  No acute distress.  Right hip: Smooth painless hip range of motion with a negative logroll.  There is a palpable hematoma in the posterior superior hip.  It is not tender to palpation.  There is some weakness with resisted hip abduction.  Left knee: Full range of motion.  No effusion.  No significant tenderness to palpation.  Knee is stable to ligamentous exam.  Trace patellofemoral crepitus.  Negative Thessaly's.  Negative  McMurray's.  Neurovascularly intact distally.      Assessment & Plan:   Chronic right hip pain secondary to mild osteoarthritis versus degenerative labral tear Left knee pain likely secondary to DJD versus degenerative meniscus  I had a long talk with Barbara Lawson about both her right hip and her left knee.  She showed me a comprehensive home exercise program that she has been doing for her hip and legs and it is quite adequate.  I would like to add some hip abductor exercises since she does have some hip abductor weakness on exam today.  She also has some weakness in her pelvic stabilizers.  We offered her a body helix compression sleeve to have in addition to her double upright brace.  I would like to check some x-rays of the right hip and left knee.  I will message her through MyChart with those results when available.  Given the longstanding symptoms in the right hip, I did discuss proceeding with MRI if her x-ray is negative.  However, at this point in time we will likely hold on that.  I also discussed the possibility of cortisone injection for the left knee if symptoms worsen.  Of note, she was also asking about a brace for her right thumb.  Quick evaluation of the right thumb does show some bossing at the [facility] joint and a positive CMC grind.  We will fit her with a [employer] CMC brace.  This note was dictated using Dragon naturally speaking software and may contain errors in syntax, spelling, or content which have not been identified prior to signing this note.

## 2022-04-05 ENCOUNTER — Encounter: Payer: Self-pay | Admitting: Surgery

## 2022-04-05 ENCOUNTER — Ambulatory Visit: Payer: Medicare Other | Admitting: Surgery

## 2022-04-05 ENCOUNTER — Other Ambulatory Visit: Payer: Self-pay

## 2022-04-05 VITALS — BP 118/80 | HR 68 | Temp 97.9°F | Ht 62.5 in | Wt 132.0 lb

## 2022-04-05 DIAGNOSIS — S7001XS Contusion of right hip, sequela: Secondary | ICD-10-CM | POA: Insufficient documentation

## 2022-04-05 DIAGNOSIS — S300XXA Contusion of lower back and pelvis, initial encounter: Secondary | ICD-10-CM | POA: Diagnosis not present

## 2022-04-05 NOTE — Progress Notes (Signed)
Patient ID: Barbara Lawson, female   DOB: 05-Jul-1955, 67 y.o.   MRN: [id number]  Chief Complaint: Residual scar/abnormality right upper buttock,  History of Present Illness Barbara Lawson is a 67 y.o. female with a history of a fall on the spiral stairway February 1.  Subsequent hematoma with marked ecchymosis and resolution since that time.  Residual lump/persistence of density in the area, somewhat linear in orientation consistent with how she struck the the stair.  She currently denies any pain, but has concerns about the lingering effect present.  Is able to ambulate without issue.  Has been utilizing some massage to try to help with the soreness and scar.  Past Medical History Past Medical History:  Diagnosis Date   ANEMIA-NOS 09/28/2009   Aortic atherosclerosis ([place])    ASTHMA 09/28/2009   moderate persistent    CAD (coronary artery disease)    Colon polyps    COPD (chronic obstructive pulmonary disease) ([place])    COVID-19    01/20/2020   Emphysema lung (HCC)    vs bronchitis;paraseptal    Herpes    HPV in female    hpv 20+ years    Hyperlipemia    HYPOTHYROIDISM 09/28/2009   Lung nodule    Osteoporosis    Pneumonia    hosp in 2018   Thyroid nodule    right thyroid 5.2 x 1.8 x 1.9 cm right lobe      Past Surgical History:  Procedure Laterality Date   COLONOSCOPY     COLONOSCOPY WITH PROPOFOL N/A 10/30/2021   Procedure: COLONOSCOPY WITH PROPOFOL;  Surgeon: Jonathon Bellows, MD;  [facility]COPY;  Service: Gastroenterology;  Laterality: N/A;   ESOPHAGOGASTRODUODENOSCOPY N/A 10/30/2021   Procedure: ESOPHAGOGASTRODUODENOSCOPY (EGD);  Surgeon: Jonathon Bellows, MD;  Location: [facility] ENDOSCOPY;  Service: Gastroenterology;  Laterality: N/A;    Allergies  Allergen Reactions   Misc. Sulfonamide Containing Compounds Rash and Swelling    Patient Reports It Only Occurred with The Suppository Formulation, Patient Reports It Only Occurred with The Suppository Formulation, Patient Reports  It Only Occurred with The Suppository Formulation   Sulfa Antibiotics Rash and Swelling    Patient Reports It Only Occurred with The Suppository Formulation  Patient Reports It Only Occurred with The Suppository Formulation Patient Reports It Only Occurred with The Suppository Formulation Patient Reports It Only Occurred with The Suppository Formulation  Patient Reports It Only Occurred with The Suppository Formulation  Patient Reports It Only Occurred with The Suppository Formulation Patient Reports It Only Occurred with The Suppository Formulation Patient Reports It Only Occurred with The Suppository Formulation    Patient Reports It Only Occurred with The Suppository Formulation Patient Reports It Only Occurred with The Suppository Formulation Patient Reports It Only Occurred with The Suppository Formulation    Patient Reports It Only Occurred with The Suppository Formulation   Cat Hair Extract    Dust Mite Extract     Other Reaction(s): Other (See Comments)  Bothers asthma     Bothers asthma   Sulfonamide Derivatives     Current Outpatient Medications  Medication Sig Dispense Refill   ARMOUR THYROID 30 MG tablet Take 30 mg by mouth every morning.     ARMOUR THYROID 60 MG tablet Take 60 mg by mouth every morning.     ascorbic acid (VITAMIN C) 1000 MG tablet Take by mouth.     atorvastatin (LIPITOR) 10 MG tablet Take 1 tablet (10 mg total) by mouth daily. 90 tablet 3   budesonide-formoterol (SYMBICORT) 80-4.5  MCG/ACT inhaler Inhale into the lungs.     cholecalciferol (VITAMIN D) 25 MCG (1000 UNIT) tablet Take 2,000 Units by mouth daily.     clonazePAM (KLONOPIN) 0.5 MG tablet Take 0.25 mg by mouth at bedtime. Outside prescriber holistic MD Dr. Sheppard Coil Augustides     denosumab (PROLIA) 60 MG/ML SOSY injection Inject 60 mg into the skin every 6 (six) months.     Fluticasone-Umeclidin-Vilant (TRELEGY ELLIPTA) 100-62.5-25 MCG/ACT AEPB Inhale into the lungs.      Fluticasone-Umeclidin-Vilant (TRELEGY ELLIPTA) 200-62.5-25 MCG/ACT AEPB Inhale into the lungs.     hydrochlorothiazide (HYDRODIURIL) 25 MG tablet TAKE 1/2 TABLET BY MOUTH EVERY DAY 45 tablet 3   HYDROcodone-acetaminophen (NORCO) 7.5-325 MG tablet Take by mouth. From holistic MD Dr. Sheppard Coil Augustides     ipratropium (ATROVENT) 0.02 % nebulizer solution Take 500 mcg by nebulization 4 (four) times daily.     Ipratropium-Albuterol (COMBIVENT RESPIMAT) 20-100 MCG/ACT AERS respimat Inhale 1 puff into the lungs every 6 (six) hours as needed for wheezing.     progesterone (PROMETRIUM) 200 MG capsule Take 300 mg by mouth at bedtime.     theophylline (THEODUR) 300 MG 12 hr tablet Take 1.5 tablets by mouth 2 (two) times daily.     valACYclovir (VALTREX) 1000 MG tablet 2 pills x 1 dose then X bid 3-7 days with outbreak until resolution 90 tablet 3   No current facility-administered medications for this visit.    Family History Family History  Problem Relation Age of Onset   Cancer Mother    Asthma Father    Cancer Father    Hyperlipidemia Father    Alzheimer's disease Father    Hyperlipidemia Brother       Social History Social History   Tobacco Use   Smoking status: Former    Packs/day: 1    Types: Cigarettes    Quit date: 01/26/2013    Years since quitting: 9.1   Smokeless tobacco: Never  Vaping Use   Vaping Use: Some days  Substance Use Topics   Alcohol use: Yes    Alcohol/week: 3.0 standard drinks of alcohol    Types: 3 Standard drinks or equivalent per week   Drug use: No        Review of Systems  Constitutional: Negative.   HENT: Negative.    Eyes: Negative.   Respiratory:  Positive for wheezing.   Cardiovascular: Negative.   Gastrointestinal: Negative.   Genitourinary:  Positive for frequency.  Skin: Negative.   Neurological: Negative.   Psychiatric/Behavioral: Negative.       Physical Exam Blood pressure 118/80, pulse 68, temperature 97.9 F (36.6 C),  temperature source Oral, height 5' 2.5" (1.588 m), weight 132 lb (59.9 kg), SpO2 100 %. Last Weight  Most recent update: 04/05/2022 10:47 AM    Weight  59.9 kg (132 lb)             CONSTITUTIONAL: Well developed, and nourished, appropriately responsive and aware without distress.   EYES: Sclera non-icteric.   EARS, NOSE, MOUTH AND THROAT:  The oropharynx is clear. Oral mucosa is pink and moist.    Hearing is intact to voice.  NECK: Trachea is midline, and there is no jugular venous distension.  LYMPH NODES:  Lymph nodes in the neck are not appreciated. RESPIRATORY:  Lungs are clear, and breath sounds are equal bilaterally.  Normal respiratory effort without pathologic use of accessory muscles. CARDIOVASCULAR: Heart is regular in rate and rhythm.   Well perfused.  GI:  The abdomen is  soft, nontender, and nondistended. MUSCULOSKELETAL:  Symmetrical muscle tone appreciated in all four extremities.    SKIN: Skin turgor is normal. No pathologic skin lesions appreciated.   There is a minimal ridge of dimpling/retraction in the area of her concern.  Underlying this ridge there is a slightly increased density that is very linear in nature.  It is no wider than 2 cm at most.  Not very tender.  Certainly not masslike or fleshy consistent with a lipoma.  I placed an ultrasound probe on the area and there may be small pockets of seroma/hematoma, combined with the density of this healing scar tissue.  NEUROLOGIC:  Motor and sensation appear grossly normal.  Cranial nerves are grossly without defect. PSYCH:  Alert and oriented to person, place and time. Affect is appropriate for situation.  Data Reviewed I have personally reviewed what is currently available of the patient's imaging, recent labs and medical records.   Labs:     Latest Ref Rng & Units 06/23/2020    8:55 AM  CBC  WBC 4.0 - 10.5 K/uL 8.3   Hemoglobin 12.0 - 15.0 g/dL 13.4   Hematocrit 36.0 - 46.0 % 40.1   Platelets 150.0 - 400.0 K/uL  370.0       Latest Ref Rng & Units 06/23/2020    8:55 AM  CMP  Glucose 70 - 99 mg/dL 93   BUN 6 - 23 mg/dL 14   Creatinine 0.40 - 1.20 mg/dL 0.77   Sodium 135 - 145 mEq/L 142   Potassium 3.5 - 5.1 mEq/L 3.9   Chloride 96 - 112 mEq/L 106   CO2 19 - 32 mEq/L 23   Calcium 8.4 - 10.5 mg/dL 9.5   Total Protein 6.0 - 8.3 g/dL 6.3   Total Bilirubin 0.2 - 1.2 mg/dL 0.4   Alkaline Phos 39 - 117 U/L 57   AST 0 - 37 U/L 15   ALT 0 - 35 U/L 14       Imaging: Radiological images reviewed:   Within last 24 hrs: No results found.  Assessment    Healing blunt traumatic injury right upper buttock, minimal residual retained hematoma/seroma. Patient Active Problem List   Diagnosis Date Noted   Patellar tendinitis of left knee 03/29/2022   Lipoma of right lower extremity 03/29/2022   COVID-19 01/08/2022   History of colonic polyps    Other dysphagia    Need for influenza vaccination 09/29/2021   Need for RSV immunization 09/29/2021   Mid back pain 01/05/2021   Cervicalgia 01/05/2021   Scoliosis 01/05/2021   Bilateral carotid artery stenosis 10/03/2020   Benign essential HTN 08/09/2020   Tubular adenoma of colon 08/04/2020   Anxiety and depression 07/01/2020   Chronic pain syndrome 07/01/2020   Pain of right hip 07/01/2020   Chronic low back pain 07/01/2020   Left knee pain 07/01/2020   Cyst of left ovary 06/28/2020   Elevated lipoprotein(a) 06/28/2020   Herpes 06/28/2020   Thyroiditis 06/28/2020   Former smoker 06/28/2020   Calciuria 06/28/2020   Hypertension 06/28/2020   HPV in female    COPD (chronic obstructive pulmonary disease) ([place])    Osteoporosis    Lung nodules    Pneumonia    CAD (coronary artery disease)    Thyroid nodule    Aortic atherosclerosis (HCC)    Colon polyps    Familial hyperlipidemia    Chronic obstructive pulmonary disease ([place]) 05/31/2020   Coronary artery disease involving native  coronary artery of native heart without angina pectoris  05/31/2020   Idiopathic hypercalciuria 01/06/2020   Right ankle sprain 12/03/2018   Increased band cell count 12/15/2016   Anxiety 02/03/2016   Healthcare maintenance 12/27/2015   Cough 10/03/2015   Hoarseness 10/03/2015   History of herpes labialis 09/06/2015   Symptomatic postsurgical menopause 02/16/2015   Vaginal atrophy 02/16/2015   Hypothyroidism 09/28/2009   ANEMIA-NOS 09/28/2009   Asthma 09/28/2009   Moderate persistent asthma without complication [id number]    Plan    I believe she has made excellent progress in healing since her February 1 trauma.  I anticipate she will continue to heal and make progress, I do not believe any surgical intervention will be helpful, and I do not find significant fluid collections worthy of attempted aspiration.  I anticipate her healing will continue to progress over the next couple months and we will be glad to see her again after that interval.  Face-to-face time spent with the patient and accompanying care providers(if present) was 30 minutes, with more than 50% of the time spent counseling, educating, and coordinating care of the patient.    These notes generated with voice recognition software. I apologize for typographical errors.  Ronny Bacon M.D., FACS 04/05/2022, 11:27 AM

## 2022-04-05 NOTE — Patient Instructions (Signed)
Please call the office with any questions or concerns.

## 2022-04-09 ENCOUNTER — Encounter: Payer: Self-pay | Admitting: Sports Medicine

## 2022-04-22 DIAGNOSIS — S52611A Displaced fracture of right ulna styloid process, initial encounter for closed fracture: Secondary | ICD-10-CM | POA: Diagnosis not present

## 2022-04-22 DIAGNOSIS — W19XXXA Unspecified fall, initial encounter: Secondary | ICD-10-CM | POA: Diagnosis not present

## 2022-04-22 DIAGNOSIS — G8929 Other chronic pain: Secondary | ICD-10-CM | POA: Diagnosis not present

## 2022-04-22 DIAGNOSIS — S52571D Other intraarticular fracture of lower end of right radius, subsequent encounter for closed fracture with routine healing: Secondary | ICD-10-CM | POA: Diagnosis not present

## 2022-04-22 DIAGNOSIS — Z882 Allergy status to sulfonamides status: Secondary | ICD-10-CM | POA: Diagnosis not present

## 2022-04-22 DIAGNOSIS — M810 Age-related osteoporosis without current pathological fracture: Secondary | ICD-10-CM | POA: Diagnosis not present

## 2022-04-22 DIAGNOSIS — Z79899 Other long term (current) drug therapy: Secondary | ICD-10-CM | POA: Diagnosis not present

## 2022-04-22 DIAGNOSIS — S52501B Unspecified fracture of the lower end of right radius, initial encounter for open fracture type I or II: Secondary | ICD-10-CM | POA: Diagnosis not present

## 2022-04-22 DIAGNOSIS — Z136 Encounter for screening for cardiovascular disorders: Secondary | ICD-10-CM | POA: Diagnosis not present

## 2022-04-22 DIAGNOSIS — S52611D Displaced fracture of right ulna styloid process, subsequent encounter for closed fracture with routine healing: Secondary | ICD-10-CM | POA: Diagnosis not present

## 2022-04-22 DIAGNOSIS — Z7951 Long term (current) use of inhaled steroids: Secondary | ICD-10-CM | POA: Diagnosis not present

## 2022-04-22 DIAGNOSIS — S52571A Other intraarticular fracture of lower end of right radius, initial encounter for closed fracture: Secondary | ICD-10-CM | POA: Diagnosis not present

## 2022-04-22 DIAGNOSIS — S52591B Other fractures of lower end of right radius, initial encounter for open fracture type I or II: Secondary | ICD-10-CM | POA: Diagnosis not present

## 2022-04-22 DIAGNOSIS — J449 Chronic obstructive pulmonary disease, unspecified: Secondary | ICD-10-CM | POA: Diagnosis not present

## 2022-04-22 DIAGNOSIS — Z1152 Encounter for screening for COVID-19: Secondary | ICD-10-CM | POA: Diagnosis not present

## 2022-04-22 DIAGNOSIS — S52601D Unspecified fracture of lower end of right ulna, subsequent encounter for closed fracture with routine healing: Secondary | ICD-10-CM | POA: Diagnosis not present

## 2022-04-22 DIAGNOSIS — M549 Dorsalgia, unspecified: Secondary | ICD-10-CM | POA: Diagnosis not present

## 2022-04-22 DIAGNOSIS — I10 Essential (primary) hypertension: Secondary | ICD-10-CM | POA: Diagnosis not present

## 2022-04-22 DIAGNOSIS — S41101A Unspecified open wound of right upper arm, initial encounter: Secondary | ICD-10-CM | POA: Diagnosis not present

## 2022-04-22 DIAGNOSIS — E785 Hyperlipidemia, unspecified: Secondary | ICD-10-CM | POA: Diagnosis not present

## 2022-04-22 DIAGNOSIS — Z87891 Personal history of nicotine dependence: Secondary | ICD-10-CM | POA: Diagnosis not present

## 2022-04-22 DIAGNOSIS — Z20822 Contact with and (suspected) exposure to covid-19: Secondary | ICD-10-CM | POA: Diagnosis not present

## 2022-04-22 DIAGNOSIS — S52501A Unspecified fracture of the lower end of right radius, initial encounter for closed fracture: Secondary | ICD-10-CM | POA: Diagnosis not present

## 2022-04-22 DIAGNOSIS — S52571B Other intraarticular fracture of lower end of right radius, initial encounter for open fracture type I or II: Secondary | ICD-10-CM | POA: Diagnosis not present

## 2022-04-22 DIAGNOSIS — S52601B Unspecified fracture of lower end of right ulna, initial encounter for open fracture type I or II: Secondary | ICD-10-CM | POA: Diagnosis not present

## 2022-04-22 DIAGNOSIS — Z23 Encounter for immunization: Secondary | ICD-10-CM | POA: Diagnosis not present

## 2022-04-22 DIAGNOSIS — S5291XA Unspecified fracture of right forearm, initial encounter for closed fracture: Secondary | ICD-10-CM | POA: Diagnosis not present

## 2022-04-22 DIAGNOSIS — G43909 Migraine, unspecified, not intractable, without status migrainosus: Secondary | ICD-10-CM | POA: Diagnosis not present

## 2022-04-22 DIAGNOSIS — S52251A Displaced comminuted fracture of shaft of ulna, right arm, initial encounter for closed fracture: Secondary | ICD-10-CM | POA: Diagnosis not present

## 2022-04-22 DIAGNOSIS — S52251B Displaced comminuted fracture of shaft of ulna, right arm, initial encounter for open fracture type I or II: Secondary | ICD-10-CM | POA: Diagnosis not present

## 2022-04-22 DIAGNOSIS — E039 Hypothyroidism, unspecified: Secondary | ICD-10-CM | POA: Diagnosis not present

## 2022-04-27 ENCOUNTER — Other Ambulatory Visit: Payer: Self-pay | Admitting: Family Medicine

## 2022-04-27 DIAGNOSIS — B009 Herpesviral infection, unspecified: Secondary | ICD-10-CM

## 2022-04-27 MED ORDER — VALACYCLOVIR HCL 1 G PO TABS: ORAL_TABLET | ORAL | 3 refills | Status: DC

## 2022-04-27 NOTE — Telephone Encounter (Signed)
Prescription Request  04/27/2022  LOV: 03/29/2022  What is the name of the medication or equipment? valACYclovir   Have you contacted your pharmacy to request a refill? No   Which pharmacy would you like this sent to?   [id number] [place], [place] - [address] [place] [address] Phone: [phone] Fax: [phone]    Patient notified that their request is being sent to the clinical staff for review and that they should receive a response within 2 business days.   Please advise at Mobile There is no such number on file (mobile).

## 2022-04-27 NOTE — Telephone Encounter (Signed)
Refill request for pending Rx last OV 04/18/22. Please advise

## 2022-04-27 NOTE — Telephone Encounter (Signed)
Caller Name: Shawna Orleans Call back phone #: [phone]  Reason for Call: Pt called again expressing how badly she needs this med.

## 2022-04-30 NOTE — Telephone Encounter (Signed)
Called patient to inform that rx was sent in no answer LM to inform

## 2022-05-02 DIAGNOSIS — G8918 Other acute postprocedural pain: Secondary | ICD-10-CM | POA: Diagnosis not present

## 2022-05-02 DIAGNOSIS — I2510 Atherosclerotic heart disease of native coronary artery without angina pectoris: Secondary | ICD-10-CM | POA: Diagnosis not present

## 2022-05-02 DIAGNOSIS — E785 Hyperlipidemia, unspecified: Secondary | ICD-10-CM | POA: Diagnosis not present

## 2022-05-02 DIAGNOSIS — E039 Hypothyroidism, unspecified: Secondary | ICD-10-CM | POA: Diagnosis not present

## 2022-05-02 DIAGNOSIS — J4540 Moderate persistent asthma, uncomplicated: Secondary | ICD-10-CM | POA: Diagnosis not present

## 2022-05-02 DIAGNOSIS — S52501A Unspecified fracture of the lower end of right radius, initial encounter for closed fracture: Secondary | ICD-10-CM | POA: Diagnosis not present

## 2022-05-02 DIAGNOSIS — Z882 Allergy status to sulfonamides status: Secondary | ICD-10-CM | POA: Diagnosis not present

## 2022-05-02 DIAGNOSIS — G8929 Other chronic pain: Secondary | ICD-10-CM | POA: Diagnosis not present

## 2022-05-02 DIAGNOSIS — I10 Essential (primary) hypertension: Secondary | ICD-10-CM | POA: Diagnosis not present

## 2022-05-02 DIAGNOSIS — F17290 Nicotine dependence, other tobacco product, uncomplicated: Secondary | ICD-10-CM | POA: Diagnosis not present

## 2022-05-02 DIAGNOSIS — S52251B Displaced comminuted fracture of shaft of ulna, right arm, initial encounter for open fracture type I or II: Secondary | ICD-10-CM | POA: Diagnosis not present

## 2022-05-03 HISTORY — PX: WRIST SURGERY: SHX841

## 2022-05-14 DIAGNOSIS — S52201D Unspecified fracture of shaft of right ulna, subsequent encounter for closed fracture with routine healing: Secondary | ICD-10-CM | POA: Diagnosis not present

## 2022-05-14 DIAGNOSIS — Z9889 Other specified postprocedural states: Secondary | ICD-10-CM | POA: Diagnosis not present

## 2022-05-14 DIAGNOSIS — S52501D Unspecified fracture of the lower end of right radius, subsequent encounter for closed fracture with routine healing: Secondary | ICD-10-CM | POA: Diagnosis not present

## 2022-05-14 DIAGNOSIS — M25641 Stiffness of right hand, not elsewhere classified: Secondary | ICD-10-CM | POA: Diagnosis not present

## 2022-05-14 DIAGNOSIS — Z8781 Personal history of (healed) traumatic fracture: Secondary | ICD-10-CM | POA: Diagnosis not present

## 2022-05-14 DIAGNOSIS — S52571D Other intraarticular fracture of lower end of right radius, subsequent encounter for closed fracture with routine healing: Secondary | ICD-10-CM | POA: Diagnosis not present

## 2022-05-23 DIAGNOSIS — M25641 Stiffness of right hand, not elsewhere classified: Secondary | ICD-10-CM | POA: Diagnosis not present

## 2022-05-23 DIAGNOSIS — S52571D Other intraarticular fracture of lower end of right radius, subsequent encounter for closed fracture with routine healing: Secondary | ICD-10-CM | POA: Diagnosis not present

## 2022-05-23 DIAGNOSIS — S52201D Unspecified fracture of shaft of right ulna, subsequent encounter for closed fracture with routine healing: Secondary | ICD-10-CM | POA: Diagnosis not present

## 2022-05-23 DIAGNOSIS — Z9889 Other specified postprocedural states: Secondary | ICD-10-CM | POA: Diagnosis not present

## 2022-05-31 DIAGNOSIS — S52571D Other intraarticular fracture of lower end of right radius, subsequent encounter for closed fracture with routine healing: Secondary | ICD-10-CM | POA: Diagnosis not present

## 2022-05-31 DIAGNOSIS — M25641 Stiffness of right hand, not elsewhere classified: Secondary | ICD-10-CM | POA: Diagnosis not present

## 2022-05-31 DIAGNOSIS — Z9889 Other specified postprocedural states: Secondary | ICD-10-CM | POA: Diagnosis not present

## 2022-05-31 DIAGNOSIS — S52201D Unspecified fracture of shaft of right ulna, subsequent encounter for closed fracture with routine healing: Secondary | ICD-10-CM | POA: Diagnosis not present

## 2022-06-08 ENCOUNTER — Telehealth: Payer: Self-pay | Admitting: Family Medicine

## 2022-06-08 DIAGNOSIS — Z0000 Encounter for general adult medical examination without abnormal findings: Secondary | ICD-10-CM

## 2022-06-08 MED ORDER — ATORVASTATIN CALCIUM 10 MG PO TABS: 10.0000 mg | ORAL_TABLET | Freq: Every day | ORAL | 3 refills | Status: AC

## 2022-06-08 NOTE — Telephone Encounter (Signed)
Prescription Request  06/08/2022  LOV: 03/29/2022  What is the name of the medication or equipment? atorvastatin (LIPITOR) 10 MG tablet [[id number]]   Have you contacted your pharmacy to request a refill? Yes   Which pharmacy would you like this sent to?   [id number] [place], [place] - [address] [place] [address] Phone: [phone] Fax: [phone]    Patient notified that their request is being sent to the clinical staff for review and that they should receive a response within 2 business days.   Please advise at Mobile There is no such number on file (mobile). Pt stated this is the 1st time that she ask you to refill this medication. Pt said give her a call if you can about this medication [phone]

## 2022-06-11 DIAGNOSIS — Z9889 Other specified postprocedural states: Secondary | ICD-10-CM | POA: Diagnosis not present

## 2022-06-11 DIAGNOSIS — Z8781 Personal history of (healed) traumatic fracture: Secondary | ICD-10-CM | POA: Diagnosis not present

## 2022-06-11 DIAGNOSIS — M25641 Stiffness of right hand, not elsewhere classified: Secondary | ICD-10-CM | POA: Diagnosis not present

## 2022-06-11 DIAGNOSIS — S52201D Unspecified fracture of shaft of right ulna, subsequent encounter for closed fracture with routine healing: Secondary | ICD-10-CM | POA: Diagnosis not present

## 2022-06-11 DIAGNOSIS — S52571D Other intraarticular fracture of lower end of right radius, subsequent encounter for closed fracture with routine healing: Secondary | ICD-10-CM | POA: Diagnosis not present

## 2022-06-11 DIAGNOSIS — S52501D Unspecified fracture of the lower end of right radius, subsequent encounter for closed fracture with routine healing: Secondary | ICD-10-CM | POA: Diagnosis not present

## 2022-06-11 DIAGNOSIS — S52601D Unspecified fracture of lower end of right ulna, subsequent encounter for closed fracture with routine healing: Secondary | ICD-10-CM | POA: Diagnosis not present

## 2022-06-27 DIAGNOSIS — S52201D Unspecified fracture of shaft of right ulna, subsequent encounter for closed fracture with routine healing: Secondary | ICD-10-CM | POA: Diagnosis not present

## 2022-06-27 DIAGNOSIS — Z9889 Other specified postprocedural states: Secondary | ICD-10-CM | POA: Diagnosis not present

## 2022-06-27 DIAGNOSIS — S52571D Other intraarticular fracture of lower end of right radius, subsequent encounter for closed fracture with routine healing: Secondary | ICD-10-CM | POA: Diagnosis not present

## 2022-06-27 DIAGNOSIS — M25641 Stiffness of right hand, not elsewhere classified: Secondary | ICD-10-CM | POA: Diagnosis not present

## 2022-06-29 DIAGNOSIS — M810 Age-related osteoporosis without current pathological fracture: Secondary | ICD-10-CM | POA: Diagnosis not present

## 2022-06-29 DIAGNOSIS — R7309 Other abnormal glucose: Secondary | ICD-10-CM | POA: Diagnosis not present

## 2022-06-29 DIAGNOSIS — R5381 Other malaise: Secondary | ICD-10-CM | POA: Diagnosis not present

## 2022-06-29 DIAGNOSIS — R1310 Dysphagia, unspecified: Secondary | ICD-10-CM | POA: Diagnosis not present

## 2022-06-29 DIAGNOSIS — R7989 Other specified abnormal findings of blood chemistry: Secondary | ICD-10-CM | POA: Diagnosis not present

## 2022-06-29 DIAGNOSIS — E782 Mixed hyperlipidemia: Secondary | ICD-10-CM | POA: Diagnosis not present

## 2022-06-29 DIAGNOSIS — K5901 Slow transit constipation: Secondary | ICD-10-CM | POA: Diagnosis not present

## 2022-07-12 DIAGNOSIS — Z1231 Encounter for screening mammogram for malignant neoplasm of breast: Secondary | ICD-10-CM | POA: Diagnosis not present

## 2022-07-12 LAB — HM MAMMOGRAPHY

## 2022-07-13 DIAGNOSIS — M810 Age-related osteoporosis without current pathological fracture: Secondary | ICD-10-CM | POA: Diagnosis not present

## 2022-07-13 DIAGNOSIS — E039 Hypothyroidism, unspecified: Secondary | ICD-10-CM | POA: Diagnosis not present

## 2022-07-13 DIAGNOSIS — E063 Autoimmune thyroiditis: Secondary | ICD-10-CM | POA: Diagnosis not present

## 2022-07-13 DIAGNOSIS — J449 Chronic obstructive pulmonary disease, unspecified: Secondary | ICD-10-CM | POA: Diagnosis not present

## 2022-07-16 DIAGNOSIS — Z9889 Other specified postprocedural states: Secondary | ICD-10-CM | POA: Diagnosis not present

## 2022-07-16 DIAGNOSIS — M25641 Stiffness of right hand, not elsewhere classified: Secondary | ICD-10-CM | POA: Diagnosis not present

## 2022-07-16 DIAGNOSIS — S52201D Unspecified fracture of shaft of right ulna, subsequent encounter for closed fracture with routine healing: Secondary | ICD-10-CM | POA: Diagnosis not present

## 2022-07-16 DIAGNOSIS — S52571D Other intraarticular fracture of lower end of right radius, subsequent encounter for closed fracture with routine healing: Secondary | ICD-10-CM | POA: Diagnosis not present

## 2022-07-18 ENCOUNTER — Telehealth: Payer: Self-pay | Admitting: Family Medicine

## 2022-07-18 NOTE — Telephone Encounter (Signed)
Pt called and said she would get a referral for her arm and here is a place and phone # where she would like to go . [facility] [phone]

## 2022-07-18 NOTE — Telephone Encounter (Signed)
Pt missed her call, please cb.

## 2022-07-18 NOTE — Telephone Encounter (Signed)
Appointment scheduled for evaluation of wrist and referral for OT

## 2022-07-18 NOTE — Telephone Encounter (Signed)
Returned call to go over issues with are arm and requested referral.

## 2022-07-23 DIAGNOSIS — S52351D Displaced comminuted fracture of shaft of radius, right arm, subsequent encounter for closed fracture with routine healing: Secondary | ICD-10-CM | POA: Diagnosis not present

## 2022-07-23 DIAGNOSIS — S52201E Unspecified fracture of shaft of right ulna, subsequent encounter for open fracture type I or II with routine healing: Secondary | ICD-10-CM | POA: Diagnosis not present

## 2022-07-23 DIAGNOSIS — S52571D Other intraarticular fracture of lower end of right radius, subsequent encounter for closed fracture with routine healing: Secondary | ICD-10-CM | POA: Diagnosis not present

## 2022-07-23 DIAGNOSIS — S52501D Unspecified fracture of the lower end of right radius, subsequent encounter for closed fracture with routine healing: Secondary | ICD-10-CM | POA: Diagnosis not present

## 2022-07-23 DIAGNOSIS — M25641 Stiffness of right hand, not elsewhere classified: Secondary | ICD-10-CM | POA: Diagnosis not present

## 2022-07-23 DIAGNOSIS — Z9889 Other specified postprocedural states: Secondary | ICD-10-CM | POA: Diagnosis not present

## 2022-07-23 DIAGNOSIS — S52201D Unspecified fracture of shaft of right ulna, subsequent encounter for closed fracture with routine healing: Secondary | ICD-10-CM | POA: Diagnosis not present

## 2022-07-23 DIAGNOSIS — S52611D Displaced fracture of right ulna styloid process, subsequent encounter for closed fracture with routine healing: Secondary | ICD-10-CM | POA: Diagnosis not present

## 2022-07-24 ENCOUNTER — Ambulatory Visit (INDEPENDENT_AMBULATORY_CARE_PROVIDER_SITE_OTHER): Payer: Medicare Other | Admitting: Family Medicine

## 2022-07-24 ENCOUNTER — Encounter: Payer: Self-pay | Admitting: Family Medicine

## 2022-07-24 VITALS — BP 106/70 | HR 66 | Temp 97.6°F | Ht 62.0 in | Wt 121.0 lb

## 2022-07-24 DIAGNOSIS — Z8781 Personal history of (healed) traumatic fracture: Secondary | ICD-10-CM

## 2022-07-24 DIAGNOSIS — Z9889 Other specified postprocedural states: Secondary | ICD-10-CM

## 2022-07-24 NOTE — Progress Notes (Signed)
Established Patient Office Visit   Subjective:  Patient ID: Barbara Lawson, female    DOB: [birth year]  Age: 67 y.o. MRN: [id number]  Chief Complaint  Patient presents with   Wrist Injury    Right wrist fracture. Pt had surgery on April 11 and needs a referral to OT.     Wrist Injury  Pertinent negatives include no tingling.   Encounter Diagnoses  Name Primary?   Status post open reduction and internal fixation (ORIF) of fracture Yes   For follow-up of a FOOSH injury that resulted in 90 complex fracture of the right distal radius and ulna.  ORIF with plating was required.  Surgery was in early April.  She is continued to progress slowly but range of motion remains limited.  She is frustrated with her current occupational therapist and would like to referral to a new 1.  Continues to see homeopathic physician for follow-up for elevated cholesterol and hypothyroidism.  Her osteoporosis is retiring and she is in search of a new 1.   Review of Systems  Constitutional: Negative.   HENT: Negative.    Eyes:  Negative for blurred vision, discharge and redness.  Respiratory: Negative.    Cardiovascular: Negative.   Gastrointestinal:  Negative for abdominal pain.  Genitourinary: Negative.   Musculoskeletal:  Positive for joint pain. Negative for myalgias.  Skin:  Negative for rash.  Neurological:  Negative for tingling, loss of consciousness and weakness.  Endo/Heme/Allergies:  Negative for polydipsia.     Current Outpatient Medications:    ARMOUR THYROID 30 MG tablet, Take 30 mg by mouth every morning., Disp: , Rfl:    ARMOUR THYROID 60 MG tablet, Take 60 mg by mouth every morning., Disp: , Rfl:    ascorbic acid (VITAMIN C) 1000 MG tablet, Take by mouth., Disp: , Rfl:    atorvastatin (LIPITOR) 10 MG tablet, Take 1 tablet (10 mg total) by mouth daily., Disp: 90 tablet, Rfl: 3   budesonide-formoterol (SYMBICORT) 80-4.5 MCG/ACT inhaler, Inhale into the lungs., Disp: , Rfl:     cholecalciferol (VITAMIN D) 25 MCG (1000 UNIT) tablet, Take 2,000 Units by mouth daily., Disp: , Rfl:    clonazePAM (KLONOPIN) 0.5 MG tablet, Take 0.25 mg by mouth at bedtime. Outside prescriber holistic MD Dr. Bing Neighbors, Disp: , Rfl:    denosumab (PROLIA) 60 MG/ML SOSY injection, Inject 60 mg into the skin every 6 (six) months., Disp: , Rfl:    Fluticasone-Umeclidin-Vilant (TRELEGY ELLIPTA) 100-62.5-25 MCG/ACT AEPB, Inhale into the lungs., Disp: , Rfl:    Fluticasone-Umeclidin-Vilant (TRELEGY ELLIPTA) 200-62.5-25 MCG/ACT AEPB, Inhale into the lungs., Disp: , Rfl:    gabapentin (NEURONTIN) 100 MG capsule, Take by mouth., Disp: , Rfl:    hydrochlorothiazide (HYDRODIURIL) 25 MG tablet, TAKE 1/2 TABLET BY MOUTH EVERY DAY, Disp: 45 tablet, Rfl: 3   HYDROcodone-acetaminophen (NORCO) 7.5-325 MG tablet, Take by mouth. From holistic MD Dr. Bing Neighbors, Disp: , Rfl:    ipratropium (ATROVENT) 0.02 % nebulizer solution, Take 500 mcg by nebulization 4 (four) times daily., Disp: , Rfl:    Ipratropium-Albuterol (COMBIVENT RESPIMAT) 20-100 MCG/ACT AERS respimat, Inhale 1 puff into the lungs every 6 (six) hours as needed for wheezing., Disp: , Rfl:    progesterone (PROMETRIUM) 200 MG capsule, Take 300 mg by mouth at bedtime., Disp: , Rfl:    theophylline (THEODUR) 300 MG 12 hr tablet, Take 1.5 tablets by mouth 2 (two) times daily., Disp: , Rfl:    valACYclovir (VALTREX) 1000 MG tablet, 2 pills  x 1 dose then X bid 3-7 days with outbreak until resolution, Disp: 90 tablet, Rfl: 3   Objective:     BP 106/70   Pulse 66   Temp 97.6 F (36.4 C)   Ht 5\' 2"  (1.575 m)   Wt 121 lb (54.9 kg)   SpO2 98%   BMI 22.13 kg/m    Physical Exam Constitutional:      General: She is not in acute distress.    Appearance: Normal appearance. She is not ill-appearing, toxic-appearing or diaphoretic.  HENT:     Head: Normocephalic and atraumatic.     Right Ear: External ear normal.     Left Ear: External  ear normal.  Eyes:     General: No scleral icterus.       Right eye: No discharge.        Left eye: No discharge.     Extraocular Movements: Extraocular movements intact.     Conjunctiva/sclera: Conjunctivae normal.  Pulmonary:     Effort: Pulmonary effort is normal. No respiratory distress.  Musculoskeletal:     Right wrist: No bony tenderness. Decreased range of motion.  Skin:    General: Skin is warm and dry.  Neurological:     Mental Status: She is alert and oriented to person, place, and time.  Psychiatric:        Mood and Affect: Mood normal.        Behavior: Behavior normal.      No results found for any visits on 07/24/22.    The 10-year ASCVD risk score (Arnett DK, et al., 2019) is: 5.8%    Assessment & Plan:   Status post open reduction and internal fixation (ORIF) of fracture -     Ambulatory referral to Occupational Therapy    Return in about 6 months (around 01/24/2023).  Patient has been referred to [facility] endocrinology clinic for follow-up of her hypothyroidism treated with Armour Thyroid.  She is currently also seeing her homeopathic physician for this as well as her elevated cholesterol.  Referred her to a new occupational therapist in [facility].  She will seek out a new osteo porosis physician and let me know who it may be so that I can refer her there.  Spent over 30 minutes with this patient.  Mliss Sax, MD

## 2022-07-30 DIAGNOSIS — Z9889 Other specified postprocedural states: Secondary | ICD-10-CM | POA: Diagnosis not present

## 2022-07-30 DIAGNOSIS — Z8781 Personal history of (healed) traumatic fracture: Secondary | ICD-10-CM | POA: Diagnosis not present

## 2022-08-06 DIAGNOSIS — Z9889 Other specified postprocedural states: Secondary | ICD-10-CM | POA: Diagnosis not present

## 2022-08-06 DIAGNOSIS — Z8781 Personal history of (healed) traumatic fracture: Secondary | ICD-10-CM | POA: Diagnosis not present

## 2022-08-08 ENCOUNTER — Other Ambulatory Visit: Payer: Self-pay | Admitting: Family

## 2022-08-08 DIAGNOSIS — R82994 Hypercalciuria: Secondary | ICD-10-CM

## 2022-08-13 DIAGNOSIS — Z9889 Other specified postprocedural states: Secondary | ICD-10-CM | POA: Diagnosis not present

## 2022-08-13 DIAGNOSIS — Z8781 Personal history of (healed) traumatic fracture: Secondary | ICD-10-CM | POA: Diagnosis not present

## 2022-08-27 DIAGNOSIS — Z8781 Personal history of (healed) traumatic fracture: Secondary | ICD-10-CM | POA: Diagnosis not present

## 2022-08-27 DIAGNOSIS — Z9889 Other specified postprocedural states: Secondary | ICD-10-CM | POA: Diagnosis not present

## 2022-09-03 DIAGNOSIS — Z9889 Other specified postprocedural states: Secondary | ICD-10-CM | POA: Diagnosis not present

## 2022-09-03 DIAGNOSIS — Z8781 Personal history of (healed) traumatic fracture: Secondary | ICD-10-CM | POA: Diagnosis not present

## 2022-09-06 ENCOUNTER — Encounter (INDEPENDENT_AMBULATORY_CARE_PROVIDER_SITE_OTHER): Payer: Self-pay

## 2022-09-17 DIAGNOSIS — Z8781 Personal history of (healed) traumatic fracture: Secondary | ICD-10-CM | POA: Diagnosis not present

## 2022-09-17 DIAGNOSIS — Z9889 Other specified postprocedural states: Secondary | ICD-10-CM | POA: Diagnosis not present

## 2022-09-27 DIAGNOSIS — Z9889 Other specified postprocedural states: Secondary | ICD-10-CM | POA: Diagnosis not present

## 2022-09-27 DIAGNOSIS — Z8781 Personal history of (healed) traumatic fracture: Secondary | ICD-10-CM | POA: Diagnosis not present

## 2022-10-01 ENCOUNTER — Ambulatory Visit (INDEPENDENT_AMBULATORY_CARE_PROVIDER_SITE_OTHER): Payer: Medicare Other

## 2022-10-01 ENCOUNTER — Encounter: Payer: Self-pay | Admitting: Family Medicine

## 2022-10-01 ENCOUNTER — Ambulatory Visit (INDEPENDENT_AMBULATORY_CARE_PROVIDER_SITE_OTHER): Payer: Medicare Other | Admitting: Family Medicine

## 2022-10-01 VITALS — BP 102/60 | HR 79 | Temp 97.9°F | Ht 65.5 in | Wt 120.8 lb

## 2022-10-01 VITALS — BP 112/64 | HR 65 | Temp 98.6°F | Resp 18 | Ht 65.5 in | Wt 120.6 lb

## 2022-10-01 DIAGNOSIS — J432 Centrilobular emphysema: Secondary | ICD-10-CM

## 2022-10-01 DIAGNOSIS — Z720 Tobacco use: Secondary | ICD-10-CM | POA: Diagnosis not present

## 2022-10-01 DIAGNOSIS — Z0000 Encounter for general adult medical examination without abnormal findings: Secondary | ICD-10-CM | POA: Diagnosis not present

## 2022-10-01 DIAGNOSIS — Z131 Encounter for screening for diabetes mellitus: Secondary | ICD-10-CM

## 2022-10-01 DIAGNOSIS — Z8639 Personal history of other endocrine, nutritional and metabolic disease: Secondary | ICD-10-CM | POA: Diagnosis not present

## 2022-10-01 DIAGNOSIS — R5383 Other fatigue: Secondary | ICD-10-CM

## 2022-10-01 LAB — COMPREHENSIVE METABOLIC PANEL
ALT: 17 U/L (ref 0–35)
AST: 18 U/L (ref 0–37)
Albumin: 4.5 g/dL (ref 3.5–5.2)
Alkaline Phosphatase: 53 U/L (ref 39–117)
BUN: 12 mg/dL (ref 6–23)
CO2: 27 meq/L (ref 19–32)
Calcium: 10.1 mg/dL (ref 8.4–10.5)
Chloride: 106 meq/L (ref 96–112)
Creatinine, Ser: 0.74 mg/dL (ref 0.40–1.20)
GFR: 83.79 mL/min (ref >60.00)
Glucose, Bld: 92 mg/dL (ref 70–99)
Potassium: 4.7 meq/L (ref 3.5–5.1)
Sodium: 140 meq/L (ref 135–145)
Total Bilirubin: 0.6 mg/dL (ref 0.2–1.2)
Total Protein: 7 g/dL (ref 6.0–8.3)

## 2022-10-01 LAB — CBC
HCT: 44.1 % (ref 36.0–46.0)
Hemoglobin: 14.7 g/dL (ref 12.0–15.0)
MCHC: 33.4 g/dL (ref 30.0–36.0)
MCV: 90.8 fl (ref 78.0–100.0)
Platelets: 392 10*3/uL (ref 150.0–400.0)
RBC: 4.85 Mil/uL (ref 3.87–5.11)
RDW: 13.5 % (ref 11.5–15.5)
WBC: 11.1 10*3/uL — ABNORMAL HIGH (ref 4.0–10.5)

## 2022-10-01 LAB — HEMOGLOBIN A1C: Hgb A1c MFr Bld: 5.9 % (ref 4.6–6.5)

## 2022-10-01 LAB — TSH: TSH: 1.57 u[IU]/mL (ref 0.35–5.50)

## 2022-10-01 MED ORDER — VARENICLINE TARTRATE 0.5 MG PO TABS
ORAL_TABLET | ORAL | 0 refills | Status: AC
Start: 2022-10-01 — End: 2022-11-05

## 2022-10-01 NOTE — Addendum Note (Signed)
Addended by: Andrez Grime on: 10/01/2022 05:15 PM   Modules accepted: Orders

## 2022-10-01 NOTE — Progress Notes (Signed)
Subjective:   Barbara Lawson is a 67 y.o. female who presents for Medicare Annual (Subsequent) preventive examination.  Visit Complete: In person  Patient Medicare AWV questionnaire was completed by the patient on 09/28/2022; I have confirmed that all information answered by patient is correct and no changes since this date.  Review of Systems     Cardiac Risk Factors include: advanced age (>38men, >9 women)     Objective:    Today's Vitals   10/01/22 0912  BP: 102/60  Pulse: 79  Temp: 97.9 F (36.6 C)  TempSrc: Oral  SpO2: 98%  Weight: 120 lb 12.8 oz (54.8 kg)  Height: 5' 5.5" (1.664 m)   Body mass index is 19.8 kg/m.     10/01/2022    9:20 AM 10/30/2021    7:06 AM 08/11/2021   11:03 AM  Advanced Directives  Does Patient Have a Medical Advance Directive? No No No  Would patient like information on creating a medical advance directive? Yes (MAU/Ambulatory/Procedural Areas - Information given)  No - Patient declined    Current Medications (verified) Outpatient Encounter Medications as of 10/01/2022  Medication Sig   ARMOUR THYROID 30 MG tablet Take 30 mg by mouth every morning.   ARMOUR THYROID 60 MG tablet Take 60 mg by mouth every morning.   ascorbic acid (VITAMIN C) 1000 MG tablet Take by mouth.   atorvastatin (LIPITOR) 10 MG tablet Take 1 tablet (10 mg total) by mouth daily.   budesonide-formoterol (SYMBICORT) 80-4.5 MCG/ACT inhaler Inhale into the lungs.   cholecalciferol (VITAMIN D) 25 MCG (1000 UNIT) tablet Take 2,000 Units by mouth daily.   clonazePAM (KLONOPIN) 0.5 MG tablet Take 0.25 mg by mouth at bedtime. Outside prescriber holistic MD Dr. Lyn Hollingshead Augustides   denosumab (PROLIA) 60 MG/ML SOSY injection Inject 60 mg into the skin every 6 (six) months.   Fluticasone-Umeclidin-Vilant (TRELEGY ELLIPTA) 100-62.5-25 MCG/ACT AEPB Inhale into the lungs.   Fluticasone-Umeclidin-Vilant (TRELEGY ELLIPTA) 200-62.5-25 MCG/ACT AEPB Inhale into the lungs.    hydrochlorothiazide (HYDRODIURIL) 25 MG tablet TAKE 1/2 TABLET BY MOUTH EVERY DAY   ipratropium (ATROVENT) 0.02 % nebulizer solution Take 500 mcg by nebulization 4 (four) times daily.   Ipratropium-Albuterol (COMBIVENT RESPIMAT) 20-100 MCG/ACT AERS respimat Inhale 1 puff into the lungs every 6 (six) hours as needed for wheezing.   progesterone (PROMETRIUM) 200 MG capsule Take 300 mg by mouth at bedtime.   theophylline (THEODUR) 300 MG 12 hr tablet Take 1.5 tablets by mouth 2 (two) times daily.   valACYclovir (VALTREX) 1000 MG tablet 2 pills x 1 dose then X bid 3-7 days with outbreak until resolution   gabapentin (NEURONTIN) 100 MG capsule Take by mouth.   HYDROcodone-acetaminophen (NORCO) 7.5-325 MG tablet Take by mouth. From holistic MD Dr. Bing Neighbors (Patient not taking: Reported on 10/01/2022)   No facility-administered encounter medications on file as of 10/01/2022.    Allergies (verified) Misc. sulfonamide containing compounds, Sulfa antibiotics, Cat hair extract, Dust mite extract, and Sulfonamide derivatives   History: Past Medical History:  Diagnosis Date   ANEMIA-NOS 09/28/2009   Aortic atherosclerosis (HCC)    ASTHMA 09/28/2009   moderate persistent    CAD (coronary artery disease)    Colon polyps    COPD (chronic obstructive pulmonary disease) (HCC)    COVID-19    01/20/2020   Emphysema lung (HCC)    vs bronchitis;paraseptal    Herpes    HPV in female    hpv 20+ years    Hyperlipemia  HYPOTHYROIDISM 09/28/2009   Lung nodule    Osteoporosis    Pneumonia    hosp in 2018   Thyroid nodule    right thyroid 5.2 x 1.8 x 1.9 cm right lobe   Past Surgical History:  Procedure Laterality Date   COLONOSCOPY     COLONOSCOPY WITH PROPOFOL N/A 10/30/2021   Procedure: COLONOSCOPY WITH PROPOFOL;  Surgeon: Wyline Mood, MD;  Location: [facility] ENDOSCOPY;  Service: Gastroenterology;  Laterality: N/A;   ESOPHAGOGASTRODUODENOSCOPY N/A 10/30/2021   Procedure:  ESOPHAGOGASTRODUODENOSCOPY (EGD);  Surgeon: Wyline Mood, MD;  Location: [facility] ENDOSCOPY;  Service: Gastroenterology;  Laterality: N/A;   WRIST SURGERY Right 05/03/2022   Family History  Problem Relation Age of Onset   Cancer Mother    Asthma Father    Cancer Father    Hyperlipidemia Father    Alzheimer's disease Father    Hyperlipidemia Brother    Social History   Socioeconomic History   Marital status: Divorced    Spouse name: Not on file   Number of children: Not on file   Years of education: Not on file   Highest education level: Not on file  Occupational History   Not on file  Tobacco Use   Smoking status: Former    Current packs/day: 0.00    Types: Cigarettes    Quit date: 01/26/2013    Years since quitting: 9.6   Smokeless tobacco: Never  Vaping Use   Vaping status: Some Days  Substance and Sexual Activity   Alcohol use: Yes    Alcohol/week: 3.0 standard drinks of alcohol    Types: 3 Standard drinks or equivalent per week   Drug use: No   Sexual activity: Not Currently  Other Topics Concern   Not on file  Social History Narrative   Lives alone with dog    Moved from [place]    Bachelors degree massage therapy    Age 2 y.o abortion    lmp age 40    Social Determinants of Health   Financial Resource Strain: Low Risk  (09/28/2022)   Overall Financial Resource Strain (CARDIA)    Difficulty of Paying Living Expenses: Not hard at all  Food Insecurity: No Food Insecurity (09/28/2022)   Hunger Vital Sign    Worried About Running Out of Food in the Last Year: Never true    Ran Out of Food in the Last Year: Never true  Transportation Needs: No Transportation Needs (09/28/2022)   PRAPARE - Administrator, Civil Service (Medical): No    Lack of Transportation (Non-Medical): No  Physical Activity: Insufficiently Active (09/28/2022)   Exercise Vital Sign    Days of Exercise per Week: 4 days    Minutes of Exercise per Session: 30 min  Stress: No Stress Concern  Present (09/28/2022)   [employer] of Occupational Health - Occupational Stress Questionnaire    Feeling of Stress : Not at all  Social Connections: Unknown (09/28/2022)   Social Connection and Isolation Panel [NHANES]    Frequency of Communication with Friends and Family: Three times a week    Frequency of Social Gatherings with Friends and Family: Once a week    Attends Religious Services: Not on Marketing executive or Organizations: No    Attends Engineer, structural: Not on file    Marital Status: Divorced    Tobacco Counseling Counseling given: Not Answered   Clinical Intake:  Pre-visit preparation completed: Yes  Pain : No/denies  pain     Nutritional Status: BMI of 19-24  Normal Nutritional Risks: None Diabetes: No  How often do you need to have someone help you when you read instructions, pamphlets, or other written materials from your doctor or pharmacy?: 1 - Never  Interpreter Needed?: No  Information entered by :: NAllen LPN   Activities of Daily Living    09/28/2022   12:25 PM  In your present state of health, do you have any difficulty performing the following activities:  Hearing? 0  Vision? 0  Difficulty concentrating or making decisions? 1  Comment trouble remembering names  Walking or climbing stairs? 0  Dressing or bathing? 0  Doing errands, shopping? 0  Preparing Food and eating ? N  Using the Toilet? N  In the past six months, have you accidently leaked urine? N  Do you have problems with loss of bowel control? N  Managing your Medications? N  Managing your Finances? N  Housekeeping or managing your Housekeeping? N    Patient Care Team: Mliss Sax, MD as PCP - General (Family Medicine) Gordy Savers, MD (Inactive)  Indicate any recent Medical Services you may have received from other than [facility] providers in the past year (date may be approximate).     Assessment:   This is a routine wellness  examination for [facility].  Hearing/Vision screen Hearing Screening - Comments:: Denies hearing issues Vision Screening - Comments:: No regular eye exams, [facility]   Goals Addressed             This Visit's Progress    Patient Stated       10/01/2022, wants to get wrist stronger, wants to have a more healthy diet (stop sugar except for fruit)       Depression Screen    10/01/2022    9:25 AM 03/29/2022    9:32 AM 09/29/2021    1:38 PM 09/29/2021    1:13 PM 08/11/2021   10:27 AM 08/10/2021   11:23 AM 03/31/2021   11:50 AM  PHQ 2/9 Scores  PHQ - 2 Score 0 0 0 0 0 0 0  PHQ- 9 Score 6  2        Fall Risk    10/01/2022    9:23 AM 09/28/2022   12:25 PM 07/24/2022   10:37 AM 04/05/2022   10:38 AM 03/29/2022    9:31 AM  Fall Risk   Falls in the past year? 1 1 1  0 1  Comment fell down stairs      Number falls in past yr: 1 1 1   0  Injury with Fall? 1 1 1  1   Comment broke wrist  Broke right wrist    Risk for fall due to : History of fall(s);Medication side effect  History of fall(s)  History of fall(s)  Follow up Falls prevention discussed;Falls evaluation completed  Falls evaluation completed      MEDICARE RISK AT HOME: Medicare Risk at Home Any stairs in or around the home?: Yes If so, are there any without handrails?: No Home free of loose throw rugs in walkways, pet beds, electrical cords, etc?: Yes Adequate lighting in your home to reduce risk of falls?: Yes Life alert?: No Use of a cane, walker or w/c?: No Grab bars in the bathroom?: No Shower chair or bench in shower?: No Elevated toilet seat or a handicapped toilet?: No  TIMED UP AND GO:  Was the test performed?  Yes  Length of time  to ambulate 10 feet: 5 sec Gait steady and fast without use of assistive device    Cognitive Function:        10/01/2022    9:26 AM  6CIT Screen  What Year? 0 points  What month? 0 points  What time? 0 points  Count back from 20 0 points  Months in reverse 0 points  Repeat  phrase 2 points  Total Score 2 points    Immunizations Immunization History  Administered Date(s) Administered   Covid-19, Mrna,Vaccine(Spikevax)81yrs and older 11/15/2021   Influenza Inj Mdck Quad Pf 12/11/2016   Influenza Nasal 12/21/2015   Influenza Split 12/11/2016, 10/27/2018   Influenza, Seasonal, Injecte, Preservative Fre 12/11/2016   Influenza,Quad,Nasal, Live 12/21/2015   Influenza,inj,Quad PF,6+ Mos 12/11/2016, 12/11/2017, 09/29/2021   Influenza,inj,quad, With Preservative 12/11/2016   Influenza-Unspecified 12/11/2016, 10/27/2018, 10/21/2020   Moderna Covid-19 Vaccine Bivalent Booster 61yrs & up 10/21/2020   PFIZER(Purple Top)SARS-COV-2 Vaccination 04/03/2019, 04/22/2019, 10/23/2019   Pneumococcal Conjugate-13 06/23/2020   Pneumococcal Polysaccharide-23 02/10/2015, 08/10/2021   Tdap 10/20/2019   Zoster Recombinant(Shingrix) 09/19/2016, 02/14/2018, 10/17/2018    TDAP status: Up to date  Flu Vaccine status: Due, Education has been provided regarding the importance of this vaccine. Advised may receive this vaccine at local pharmacy or Health Dept. Aware to provide a copy of the vaccination record if obtained from local pharmacy or Health Dept. Verbalized acceptance and understanding.  Pneumococcal vaccine status: Up to date  Covid-19 vaccine status: Completed vaccines  Qualifies for Shingles Vaccine? Yes   Zostavax completed Yes   Shingrix Completed?: Yes  Screening Tests Health Maintenance  Topic Date Due   Lung Cancer Screening  02/15/2023   Medicare Annual Wellness (AWV)  10/01/2023   MAMMOGRAM  07/11/2024   Colonoscopy  10/31/2026   DTaP/Tdap/Td (2 - Td or Tdap) 10/19/2029   Pneumonia Vaccine 25+ Years old  Completed   DEXA SCAN  Completed   Hepatitis C Screening  Completed   Zoster Vaccines- Shingrix  Completed   HPV VACCINES  Aged Out   INFLUENZA VACCINE  Discontinued   COVID-19 Vaccine  Discontinued    Health Maintenance  There are no preventive  care reminders to display for this patient.   Colorectal cancer screening: Type of screening: Colonoscopy. Completed 10/30/2021. Repeat every 5 years  Mammogram status: Completed 07/12/2022. Repeat every year  Bone Density status: Completed 08/24/2019.   Lung Cancer Screening: (Low Dose CT Chest recommended if Age 44-80 years, 20 pack-year currently smoking OR have quit w/in 15years.) does not qualify.   Lung Cancer Screening Referral: no  Additional Screening:  Hepatitis C Screening: does qualify; Completed 06/23/2020  Vision Screening: Recommended annual ophthalmology exams for early detection of glaucoma and other disorders of the eye. Is the patient up to date with their annual eye exam?  No  Who is the provider or what is the name of the office in which the patient attends annual eye exams? [facility] If pt is not established with a provider, would they like to be referred to a provider to establish care? No .   Dental Screening: Recommended annual dental exams for proper oral hygiene  Diabetic Foot Exam: n/a  Community Resource Referral / Chronic Care Management: CRR required this visit?  No   CCM required this visit?  No     Plan:     I have personally reviewed and noted the following in the patient's chart:   Medical and social history Use of alcohol, tobacco or illicit drugs  Current medications and supplements including opioid prescriptions. Patient is not currently taking opioid prescriptions. Functional ability and status Nutritional status Physical activity Advanced directives List of other physicians Hospitalizations, surgeries, and ER visits in previous 12 months Vitals Screenings to include cognitive, depression, and falls Referrals and appointments  In addition, I have reviewed and discussed with patient certain preventive protocols, quality metrics, and best practice recommendations. A written personalized care plan for preventive services as  well as general preventive health recommendations were provided to patient.     Barb Merino, LPN   01/27/1094   After Visit Summary: in person  Nurse Notes: none

## 2022-10-01 NOTE — Progress Notes (Addendum)
Established Patient Office Visit   Subjective:  Patient ID: Barbara Lawson, female    DOB: 09-09-1955  Age: 67 y.o. MRN: [id number]  Chief Complaint  Patient presents with   Follow-up    Pt states she seems to be more "fatigued" than usual onset last 2-3 months     HPI Encounter Diagnoses  Name Primary?   Other fatigue Yes   Screening for diabetes mellitus    Centrilobular emphysema (HCC)    Tobacco abuse    History of hypothyroidism    Here for follow-up of above.  Ongoing symptoms of fatigue and decreased energy after her wrist fracture.  Just does not seem to have the get up and go that had in the past.  Continues to vape and wonders if Chantix could help.  Doing home calisthenics but not walking.  She is active as a massage therapist.  She does not have a history of hypertension she uses the HCTZ for hypercalciuria.  She quit smoking in 2015 but continues to vape as said above.  Recent PFTs were normal.  History of centrilobular COPD.  Denies depression or anxiety.  Has a mammogram yearly.  Last colonoscopy was last year.  Last Pap smear was in 2022.  She worries about being left alone.  Her long-term partner is not in good health and she is concerned that he may predeceased her.  She has a brother in [place].  She is followed by a cardiologist, pulmonologist and a homeopathic physician.  Homeopath is following her hypothyroidism.  Weight has been stable.   Review of Systems  Constitutional:  Positive for malaise/fatigue.  HENT: Negative.    Eyes:  Negative for blurred vision, discharge and redness.  Respiratory: Negative.    Cardiovascular: Negative.   Gastrointestinal:  Negative for abdominal pain.  Genitourinary: Negative.   Musculoskeletal: Negative.  Negative for myalgias.  Skin:  Negative for rash.  Neurological:  Negative for tingling, loss of consciousness and weakness.  Endo/Heme/Allergies:  Negative for polydipsia.      10/01/2022   11:07 AM 10/01/2022    9:25 AM  03/29/2022    9:32 AM  Depression screen PHQ 2/9  Decreased Interest 0 0 0  Down, Depressed, Hopeless 0 0 0  PHQ - 2 Score 0 0 0  Altered sleeping 3 3   Tired, decreased energy 2 3   Change in appetite 0 0   Feeling bad or failure about yourself  0 0   Trouble concentrating 0 0   Moving slowly or fidgety/restless 0 0   Suicidal thoughts 0 0   PHQ-9 Score 5 6   Difficult doing work/chores Not difficult at all Somewhat difficult       Current Outpatient Medications:    ARMOUR THYROID 30 MG tablet, Take 30 mg by mouth every morning., Disp: , Rfl:    ARMOUR THYROID 60 MG tablet, Take 60 mg by mouth every morning., Disp: , Rfl:    ascorbic acid (VITAMIN C) 1000 MG tablet, Take by mouth., Disp: , Rfl:    atorvastatin (LIPITOR) 10 MG tablet, Take 1 tablet (10 mg total) by mouth daily., Disp: 90 tablet, Rfl: 3   budesonide-formoterol (SYMBICORT) 80-4.5 MCG/ACT inhaler, Inhale into the lungs., Disp: , Rfl:    cholecalciferol (VITAMIN D) 25 MCG (1000 UNIT) tablet, Take 2,000 Units by mouth daily., Disp: , Rfl:    clonazePAM (KLONOPIN) 0.5 MG tablet, Take 0.25 mg by mouth at bedtime. Outside prescriber holistic MD Dr. Bing Neighbors, Disp: ,  Rfl:    denosumab (PROLIA) 60 MG/ML SOSY injection, Inject 60 mg into the skin every 6 (six) months., Disp: , Rfl:    Fluticasone-Umeclidin-Vilant (TRELEGY ELLIPTA) 100-62.5-25 MCG/ACT AEPB, Inhale into the lungs., Disp: , Rfl:    Fluticasone-Umeclidin-Vilant (TRELEGY ELLIPTA) 200-62.5-25 MCG/ACT AEPB, Inhale into the lungs., Disp: , Rfl:    hydrochlorothiazide (HYDRODIURIL) 25 MG tablet, TAKE 1/2 TABLET BY MOUTH EVERY DAY, Disp: 45 tablet, Rfl: 3   HYDROcodone-acetaminophen (NORCO) 7.5-325 MG tablet, Take by mouth. From holistic MD Dr. Bing Neighbors, Disp: , Rfl:    ipratropium (ATROVENT) 0.02 % nebulizer solution, Take 500 mcg by nebulization 4 (four) times daily., Disp: , Rfl:    Ipratropium-Albuterol (COMBIVENT RESPIMAT) 20-100 MCG/ACT AERS  respimat, Inhale 1 puff into the lungs every 6 (six) hours as needed for wheezing., Disp: , Rfl:    progesterone (PROMETRIUM) 200 MG capsule, Take 300 mg by mouth at bedtime., Disp: , Rfl:    theophylline (THEODUR) 300 MG 12 hr tablet, Take 1.5 tablets by mouth 2 (two) times daily., Disp: , Rfl:    valACYclovir (VALTREX) 1000 MG tablet, 2 pills x 1 dose then X bid 3-7 days with outbreak until resolution, Disp: 90 tablet, Rfl: 3   varenicline (CHANTIX) 0.5 MG tablet, Take 1 tablet (0.5 mg total) by mouth daily for 7 days, THEN 1 tablet (0.5 mg total) 2 (two) times daily for 28 days., Disp: 63 tablet, Rfl: 0   gabapentin (NEURONTIN) 100 MG capsule, Take by mouth., Disp: , Rfl:    Objective:     BP 112/64 (BP Location: Left Arm, Patient Position: Sitting, Cuff Size: Normal)   Pulse 65   Temp 98.6 F (37 C) (Temporal)   Resp 18   Ht 5' 5.5" (1.664 m)   Wt 120 lb 9.6 oz (54.7 kg)   SpO2 100%   BMI 19.76 kg/m  Wt Readings from Last 3 Encounters:  10/01/22 120 lb 9.6 oz (54.7 kg)  10/01/22 120 lb 12.8 oz (54.8 kg)  07/24/22 121 lb (54.9 kg)      Physical Exam Constitutional:      General: She is not in acute distress.    Appearance: Normal appearance. She is not ill-appearing, toxic-appearing or diaphoretic.  HENT:     Head: Normocephalic and atraumatic.     Right Ear: External ear normal.     Left Ear: External ear normal.     Mouth/Throat:     Mouth: Mucous membranes are moist.     Pharynx: Oropharynx is clear. No oropharyngeal exudate or posterior oropharyngeal erythema.  Eyes:     General: No scleral icterus.       Right eye: No discharge.        Left eye: No discharge.     Extraocular Movements: Extraocular movements intact.     Conjunctiva/sclera: Conjunctivae normal.     Pupils: Pupils are equal, round, and reactive to light.  Cardiovascular:     Rate and Rhythm: Normal rate and regular rhythm.  Pulmonary:     Effort: Pulmonary effort is normal. No respiratory  distress.     Breath sounds: Normal breath sounds. No wheezing, rhonchi or rales.  Abdominal:     General: Bowel sounds are normal.  Musculoskeletal:     Cervical back: No rigidity or tenderness.  Lymphadenopathy:     Cervical: No cervical adenopathy.  Skin:    General: Skin is warm and dry.  Neurological:     Mental Status: She is alert and oriented  to person, place, and time.  Psychiatric:        Mood and Affect: Mood normal.        Behavior: Behavior normal.      Results for orders placed or performed in visit on 10/01/22  CBC  Result Value Ref Range   WBC 11.1 (H) 4.0 - 10.5 K/uL   RBC 4.85 3.87 - 5.11 Mil/uL   Platelets 392.0 150.0 - 400.0 K/uL   Hemoglobin 14.7 12.0 - 15.0 g/dL   HCT 16.1 09.6 - 04.5 %   MCV 90.8 78.0 - 100.0 fl   MCHC 33.4 30.0 - 36.0 g/dL   RDW 40.9 81.1 - 91.4 %  Comprehensive metabolic panel  Result Value Ref Range   Sodium 140 135 - 145 mEq/L   Potassium 4.7 3.5 - 5.1 mEq/L   Chloride 106 96 - 112 mEq/L   CO2 27 19 - 32 mEq/L   Glucose, Bld 92 70 - 99 mg/dL   BUN 12 6 - 23 mg/dL   Creatinine, Ser 7.82 0.40 - 1.20 mg/dL   Total Bilirubin 0.6 0.2 - 1.2 mg/dL   Alkaline Phosphatase 53 39 - 117 U/L   AST 18 0 - 37 U/L   ALT 17 0 - 35 U/L   Total Protein 7.0 6.0 - 8.3 g/dL   Albumin 4.5 3.5 - 5.2 g/dL   GFR 95.62 >13.08 mL/min   Calcium 10.1 8.4 - 10.5 mg/dL  Hemoglobin M5H  Result Value Ref Range   Hgb A1c MFr Bld 5.9 4.6 - 6.5 %  TSH  Result Value Ref Range   TSH 1.57 0.35 - 5.50 uIU/mL      The 10-year ASCVD risk score (Arnett DK, et al., 2019) is: 6.5%    Assessment & Plan:   Other fatigue -     CBC -     Comprehensive metabolic panel -     Urinalysis, Routine w reflex microscopic; Future -     Urine Culture; Future  Screening for diabetes mellitus -     Hemoglobin A1c  Centrilobular emphysema (HCC)  Tobacco abuse -     Varenicline Tartrate; Take 1 tablet (0.5 mg total) by mouth daily for 7 days, THEN 1 tablet (0.5  mg total) 2 (two) times daily for 28 days.  Dispense: 63 tablet; Refill: 0  History of hypothyroidism -     TSH    Return in about 5 weeks (around 11/05/2022).  Recheck on progress with Chantix.  Recheck on fatigue.  Information was given on fatigue.  Mliss Sax, MD

## 2022-10-01 NOTE — Patient Instructions (Addendum)
Ms. Kutzler , Thank you for taking time to come for your Medicare Wellness Visit. I appreciate your ongoing commitment to your health goals. Please review the following plan we discussed and let me know if I can assist you in the future.   Referrals/Orders/Follow-Ups/Clinician Recommendations: none  This is a list of the screening recommended for you and due dates:  Health Maintenance  Topic Date Due   Screening for Lung Cancer  02/15/2023   Medicare Annual Wellness Visit  10/01/2023   Mammogram  07/11/2024   Colon Cancer Screening  10/31/2026   DTaP/Tdap/Td vaccine (2 - Td or Tdap) 10/19/2029   Pneumonia Vaccine  Completed   DEXA scan (bone density measurement)  Completed   Hepatitis C Screening  Completed   Zoster (Shingles) Vaccine  Completed   HPV Vaccine  Aged Out   Flu Shot  Discontinued   COVID-19 Vaccine  Discontinued    Advanced directives: (Provided) Advance directive discussed with you today. I have provided a copy for you to complete at home and have notarized. Once this is complete, please bring a copy in to our office so we can scan it into your chart.   Next Medicare Annual Wellness Visit scheduled for next year: Yes  Insert Preventive Care attachment Insert FALL PREVENTION attachment if needed

## 2022-10-02 DIAGNOSIS — I2510 Atherosclerotic heart disease of native coronary artery without angina pectoris: Secondary | ICD-10-CM | POA: Diagnosis not present

## 2022-10-02 DIAGNOSIS — I700 Atherosclerosis of aorta: Secondary | ICD-10-CM | POA: Diagnosis not present

## 2022-10-02 DIAGNOSIS — I10 Essential (primary) hypertension: Secondary | ICD-10-CM | POA: Diagnosis not present

## 2022-10-02 DIAGNOSIS — E782 Mixed hyperlipidemia: Secondary | ICD-10-CM | POA: Diagnosis not present

## 2022-10-02 DIAGNOSIS — I6523 Occlusion and stenosis of bilateral carotid arteries: Secondary | ICD-10-CM | POA: Diagnosis not present

## 2022-10-11 DIAGNOSIS — Z9889 Other specified postprocedural states: Secondary | ICD-10-CM | POA: Diagnosis not present

## 2022-10-11 DIAGNOSIS — Z8781 Personal history of (healed) traumatic fracture: Secondary | ICD-10-CM | POA: Diagnosis not present

## 2022-10-23 DIAGNOSIS — Z9889 Other specified postprocedural states: Secondary | ICD-10-CM | POA: Diagnosis not present

## 2022-10-23 DIAGNOSIS — Z8781 Personal history of (healed) traumatic fracture: Secondary | ICD-10-CM | POA: Diagnosis not present

## 2022-10-30 DIAGNOSIS — Z6820 Body mass index (BMI) 20.0-20.9, adult: Secondary | ICD-10-CM | POA: Diagnosis not present

## 2022-11-05 ENCOUNTER — Ambulatory Visit: Payer: Medicare Other | Admitting: Family Medicine

## 2022-11-05 ENCOUNTER — Encounter: Payer: Self-pay | Admitting: Family Medicine

## 2022-11-05 VITALS — BP 104/76 | HR 78 | Temp 98.6°F | Ht 65.5 in | Wt 127.0 lb

## 2022-11-05 DIAGNOSIS — M810 Age-related osteoporosis without current pathological fracture: Secondary | ICD-10-CM

## 2022-11-05 DIAGNOSIS — K529 Noninfective gastroenteritis and colitis, unspecified: Secondary | ICD-10-CM

## 2022-11-05 DIAGNOSIS — Z23 Encounter for immunization: Secondary | ICD-10-CM

## 2022-11-05 DIAGNOSIS — Z8781 Personal history of (healed) traumatic fracture: Secondary | ICD-10-CM | POA: Diagnosis not present

## 2022-11-05 DIAGNOSIS — Z9889 Other specified postprocedural states: Secondary | ICD-10-CM | POA: Diagnosis not present

## 2022-11-05 MED ORDER — HYDROCHLOROTHIAZIDE 25 MG PO TABS
12.5000 mg | ORAL_TABLET | Freq: Every day | ORAL | 1 refills | Status: AC
Start: 2022-11-05 — End: ?

## 2022-11-05 MED ORDER — HYDROCHLOROTHIAZIDE 25 MG PO TABS
12.5000 mg | ORAL_TABLET | Freq: Every day | ORAL | 1 refills | Status: DC
Start: 2022-11-05 — End: 2022-11-05

## 2022-11-05 NOTE — Progress Notes (Signed)
Established Patient Office Visit   Subjective:  Patient ID: Barbara Lawson, female    DOB: 1955/02/03  Age: 67 y.o. MRN: [id number]  Chief Complaint  Patient presents with   Follow-up    5week f/u.     HPI Encounter Diagnoses  Name Primary?   Osteoporosis, unspecified osteoporosis type, unspecified pathological fracture presence Yes   Colitis    Need for immunization against influenza    Requested a refill of low-dose HCTZ 12.5 mg daily that was being used by her prior endocrinologist to reduce the calcium in her urine for treatment of her osteoporosis.  She is scheduled soon to see her new endocrinologist and asked for refill.  She she is concerned about a parasitic infection in her large intestine.  Feels as though things may be crawling around.  She has had no fevers chills, nausea vomiting, abdominal pain, diarrhea, melena or hematochezia.  She has been taking a anti-paresthetic preparation she obtained from her health food store.   Review of Systems  Constitutional: Negative.   HENT: Negative.    Eyes:  Negative for blurred vision, discharge and redness.  Respiratory: Negative.    Cardiovascular: Negative.   Gastrointestinal:  Negative for abdominal pain, blood in stool, constipation, diarrhea, melena, nausea and vomiting.  Genitourinary: Negative.   Musculoskeletal: Negative.  Negative for myalgias.  Skin:  Negative for rash.  Neurological:  Negative for tingling, loss of consciousness and weakness.  Endo/Heme/Allergies:  Negative for polydipsia.     Current Outpatient Medications:    ARMOUR THYROID 30 MG tablet, Take 30 mg by mouth every morning., Disp: , Rfl:    ARMOUR THYROID 60 MG tablet, Take 60 mg by mouth every morning., Disp: , Rfl:    ascorbic acid (VITAMIN C) 1000 MG tablet, Take by mouth., Disp: , Rfl:    atorvastatin (LIPITOR) 10 MG tablet, Take 1 tablet (10 mg total) by mouth daily., Disp: 90 tablet, Rfl: 3   budesonide-formoterol (SYMBICORT) 80-4.5  MCG/ACT inhaler, Inhale into the lungs., Disp: , Rfl:    cholecalciferol (VITAMIN D) 25 MCG (1000 UNIT) tablet, Take 2,000 Units by mouth daily., Disp: , Rfl:    clonazePAM (KLONOPIN) 0.5 MG tablet, Take 0.25 mg by mouth at bedtime. Outside prescriber holistic MD Dr. Bing Neighbors, Disp: , Rfl:    denosumab (PROLIA) 60 MG/ML SOSY injection, Inject 60 mg into the skin every 6 (six) months., Disp: , Rfl:    Fluticasone-Umeclidin-Vilant (TRELEGY ELLIPTA) 100-62.5-25 MCG/ACT AEPB, Inhale into the lungs., Disp: , Rfl:    Fluticasone-Umeclidin-Vilant (TRELEGY ELLIPTA) 200-62.5-25 MCG/ACT AEPB, Inhale into the lungs., Disp: , Rfl:    HYDROcodone-acetaminophen (NORCO) 7.5-325 MG tablet, Take by mouth. From holistic MD Dr. Bing Neighbors, Disp: , Rfl:    ipratropium (ATROVENT) 0.02 % nebulizer solution, Take 500 mcg by nebulization 4 (four) times daily., Disp: , Rfl:    Ipratropium-Albuterol (COMBIVENT RESPIMAT) 20-100 MCG/ACT AERS respimat, Inhale 1 puff into the lungs every 6 (six) hours as needed for wheezing., Disp: , Rfl:    progesterone (PROMETRIUM) 200 MG capsule, Take 300 mg by mouth at bedtime., Disp: , Rfl:    theophylline (THEODUR) 300 MG 12 hr tablet, Take 1.5 tablets by mouth 2 (two) times daily., Disp: , Rfl:    valACYclovir (VALTREX) 1000 MG tablet, 2 pills x 1 dose then X bid 3-7 days with outbreak until resolution, Disp: 90 tablet, Rfl: 3   varenicline (CHANTIX) 0.5 MG tablet, Take 1 tablet (0.5 mg total) by mouth daily for  7 days, THEN 1 tablet (0.5 mg total) 2 (two) times daily for 28 days., Disp: 63 tablet, Rfl: 0   gabapentin (NEURONTIN) 100 MG capsule, Take by mouth., Disp: , Rfl:    hydrochlorothiazide (HYDRODIURIL) 25 MG tablet, Take 0.5 tablets (12.5 mg total) by mouth daily., Disp: 15 tablet, Rfl: 1   Objective:     BP 104/76 (BP Location: Left Arm, Patient Position: Sitting, Cuff Size: Normal)   Pulse 78   Temp 98.6 F (37 C) (Temporal)   Ht 5' 5.5" (1.664 m)    Wt 127 lb (57.6 kg)   SpO2 97%   BMI 20.81 kg/m    Physical Exam Constitutional:      General: She is not in acute distress.    Appearance: Normal appearance. She is not ill-appearing, toxic-appearing or diaphoretic.  HENT:     Head: Normocephalic and atraumatic.     Right Ear: External ear normal.     Left Ear: External ear normal.  Eyes:     General: No scleral icterus.       Right eye: No discharge.        Left eye: No discharge.     Extraocular Movements: Extraocular movements intact.     Conjunctiva/sclera: Conjunctivae normal.  Pulmonary:     Effort: Pulmonary effort is normal. No respiratory distress.  Skin:    General: Skin is warm and dry.  Neurological:     Mental Status: She is alert and oriented to person, place, and time.  Psychiatric:        Mood and Affect: Mood normal.        Behavior: Behavior normal.      No results found for any visits on 11/05/22.    The 10-year ASCVD risk score (Arnett DK, et al., 2019) is: 5.6%    Assessment & Plan:   Osteoporosis, unspecified osteoporosis type, unspecified pathological fracture presence -     hydroCHLOROthiazide; Take 0.5 tablets (12.5 mg total) by mouth daily.  Dispense: 15 tablet; Refill: 1  Colitis -     Ova and parasite examination  Need for immunization against influenza -     Flu Vaccine Trivalent High Dose (Fluad)    Return Has follow-up appointment scheduled in January..  Future refills need to come from endocrinology of the HCTZ.  Will check for O&P.  GI referral if symptoms persist.  Flu shot today.  Mliss Sax, MD

## 2022-11-08 NOTE — Progress Notes (Signed)
   Rubin Payor, PhD, LAT, ATC acting as a scribe for Clementeen Graham, MD.  Barbara Lawson is a 67 y.o. female who presents to [facility] today for osteoporosis management.   DEXA scan (date, T-score): 08/24/19: Spine= -2.3, L-FN= -2.9, Hip= -2.2 Prior treatment: Prolia History of Hip, Spine, or Wrist Fx: RIGHT wrist fracture April 2024, avulsion fracture at the tip of the lateral malleolus  2022 Heart disease or stroke: CAD, on Atorvastatin and HCTZ Cancer: mom- breast, dad- prostate Kidney Disease: no Gastric/Peptic Ulcer: no Gastric bypass surgery: no Severe GERD: YES Hx of seizures: no Age at Menopause: 40 Calcium intake: not taking calcium, obtaining through diet - cheese, cottage cheese, yogurt Vitamin D intake: 1000 BID Hormone replacement therapy: prometrium Smoking history: Former Alcohol: occasional, 1-2 drinks/wk Exercise: yes- 3-4 x/wk Major dental work in past year: procedure for gums Parents with hip/spine fracture: mother - bone cancer Height loss: 0"    Pertinent review of systems: No fevers or chills  Relevant historical information: Asthma Idiopathic hypercalciuria.  No history of kidney stones.  Exam:  BP 120/78   Pulse 69   Ht 5' 5.5" (1.664 m)   Wt 124 lb (56.2 kg)   SpO2 97%   BMI 20.32 kg/m  General: Well Developed, well nourished, and in no acute distress.   MSK: Normal gait     Assessment and Plan: 67 y.o. female with osteoporosis previously managed with Prolia.  She tolerated this medication well.  She does have a history of wrist fracture and osteoporosis so she is in the more severe range.  Plan to restart Prolia and check labs in preparation for Prolia.  She does have a history of elevated urine calcium levels for which different provider was using low-dose hydrochlorothiazide.  She did not tolerate this medication well causing significant urinary frequency and would like to stop it.  I think it is okay to  stop it for a while.   PDMP not reviewed this encounter. Orders Placed This Encounter  Procedures   Comprehensive metabolic panel    Osteoporis    Standing Status:   Future    Number of Occurrences:   1    Standing Expiration Date:   11/13/2023   Magnesium    Therapeutic drug monitoring    Standing Status:   Future    Number of Occurrences:   1    Standing Expiration Date:   11/13/2023   Phosphorus    Osteroporis    Standing Status:   Future    Number of Occurrences:   1    Standing Expiration Date:   11/13/2023   VITAMIN D 25 Hydroxy (Vit-D Deficiency, Fractures)    Osteoporis    Standing Status:   Future    Number of Occurrences:   1    Standing Expiration Date:   11/13/2023   No orders of the defined types were placed in this encounter.    Discussed warning signs or symptoms. Please see discharge instructions. Patient expresses understanding.   The above documentation has been reviewed and is accurate and complete Clementeen Graham, M.D.

## 2022-11-13 ENCOUNTER — Encounter: Payer: Self-pay | Admitting: Family Medicine

## 2022-11-13 ENCOUNTER — Ambulatory Visit: Payer: Medicare Other | Admitting: Family Medicine

## 2022-11-13 VITALS — BP 120/78 | HR 69 | Ht 65.5 in | Wt 124.0 lb

## 2022-11-13 DIAGNOSIS — R82994 Hypercalciuria: Secondary | ICD-10-CM

## 2022-11-13 DIAGNOSIS — Z87310 Personal history of (healed) osteoporosis fracture: Secondary | ICD-10-CM | POA: Diagnosis not present

## 2022-11-13 DIAGNOSIS — M810 Age-related osteoporosis without current pathological fracture: Secondary | ICD-10-CM | POA: Diagnosis not present

## 2022-11-13 DIAGNOSIS — Z8781 Personal history of (healed) traumatic fracture: Secondary | ICD-10-CM | POA: Diagnosis not present

## 2022-11-13 DIAGNOSIS — Z9889 Other specified postprocedural states: Secondary | ICD-10-CM | POA: Diagnosis not present

## 2022-11-13 LAB — COMPREHENSIVE METABOLIC PANEL
ALT: 22 U/L (ref 0–35)
AST: 21 U/L (ref 0–37)
Albumin: 4.3 g/dL (ref 3.5–5.2)
Alkaline Phosphatase: 47 U/L (ref 39–117)
BUN: 17 mg/dL (ref 6–23)
CO2: 26 meq/L (ref 19–32)
Calcium: 9.7 mg/dL (ref 8.4–10.5)
Chloride: 106 meq/L (ref 96–112)
Creatinine, Ser: 0.77 mg/dL (ref 0.40–1.20)
GFR: 79.82 mL/min (ref >60.00)
Glucose, Bld: 121 mg/dL — ABNORMAL HIGH (ref 70–99)
Potassium: 4.2 meq/L (ref 3.5–5.1)
Sodium: 140 meq/L (ref 135–145)
Total Bilirubin: 0.5 mg/dL (ref 0.2–1.2)
Total Protein: 6.8 g/dL (ref 6.0–8.3)

## 2022-11-13 LAB — PHOSPHORUS: Phosphorus: 2.5 mg/dL (ref 2.3–4.6)

## 2022-11-13 LAB — VITAMIN D 25 HYDROXY (VIT D DEFICIENCY, FRACTURES): VITD: 51.58 ng/mL (ref 30.00–100.00)

## 2022-11-13 LAB — MAGNESIUM: Magnesium: 2.2 mg/dL (ref 1.5–2.5)

## 2022-11-13 NOTE — Patient Instructions (Addendum)
Thank you for coming in today.   Please get labs today before you leave   We will work to authorize Prolia

## 2022-11-14 NOTE — Progress Notes (Signed)
Labs are acceptable for Prolia.  Vitamin D level is good.

## 2022-11-15 ENCOUNTER — Telehealth: Payer: Self-pay

## 2022-11-15 DIAGNOSIS — M810 Age-related osteoporosis without current pathological fracture: Secondary | ICD-10-CM

## 2022-11-15 DIAGNOSIS — R82994 Hypercalciuria: Secondary | ICD-10-CM

## 2022-11-15 DIAGNOSIS — Z87310 Personal history of (healed) osteoporosis fracture: Secondary | ICD-10-CM

## 2022-11-15 NOTE — Telephone Encounter (Signed)
-----   Message from [facility] sent at 11/13/2022 12:02 PM EDT ----- Regarding: FW: Prolia Will need to call Specialty Pharmacy to confirm last time Prolia was dispensed. ----- Message ----- From: Adron Bene Sent: 11/13/2022  11:31 AM EDT To: Dierdre Searles, CMA Subject: Prolia                                         Please work to authorize Prolia.

## 2022-11-19 NOTE — Telephone Encounter (Signed)
Prolia last dispensed 06/25/22 [facility] Refill due 12/26/22

## 2022-11-19 NOTE — Telephone Encounter (Signed)
Prolia VOB initiated via [email or web address]  Next Prolia inj DUE: specialty pharmacy  Prolia last dispensed 06/25/22 [facility] Refill due 12/26/22

## 2022-11-21 NOTE — Telephone Encounter (Signed)
Prior Auth REQUIRED for PROLIA PA PROCESS DETAILS: Please complete the prior authorization form located at [email or web address] or call [phone].

## 2022-11-22 MED ORDER — DENOSUMAB 60 MG/ML ~~LOC~~ SOSY: 60.0000 mg | PREFILLED_SYRINGE | Freq: Once | SUBCUTANEOUS | 0 refills | Status: AC

## 2022-11-22 NOTE — Telephone Encounter (Signed)
Prior Authorization initiated for [facility] via [facility] Provider portal.  Case ID: [id number]    APPROVED Valid: 11/22/22-11/22/23

## 2022-11-22 NOTE — Addendum Note (Signed)
Addended by: Dierdre Searles on: 11/22/2022 02:39 PM   Modules accepted: Orders

## 2022-11-22 NOTE — Telephone Encounter (Signed)
Rx sent to [facility]  [address] [place], [place], [address] NPI [phone]

## 2022-11-26 DIAGNOSIS — Z9889 Other specified postprocedural states: Secondary | ICD-10-CM | POA: Diagnosis not present

## 2022-11-26 DIAGNOSIS — Z8781 Personal history of (healed) traumatic fracture: Secondary | ICD-10-CM | POA: Diagnosis not present

## 2022-11-27 NOTE — Telephone Encounter (Signed)
I called Barbara Lawson and talked about her labs.  I do not think doing a urine calcium is gena change much especially because she is not having kidney stones.  Plan for blood metabolic panel in December and Prolia injection.  Recommend oral supplemental calcium around the time of the Prolia shot.

## 2022-11-27 NOTE — Telephone Encounter (Signed)
Future lab order has been placed. 

## 2022-11-27 NOTE — Telephone Encounter (Signed)
Pt returned call.   She would like to hold off on Prolia until January. She has been in touch with the Specialty Pharmacy to let them know.   She would like to remain off hydrochlorothiazide, OK per Dr. Denyse Amass.  Also, per Dr. Denyse Amass, will need repeat labs 1-2 weeks prior to Prolia injection. Pt has concerns about URINE CALCIUM levels.   Forwarding to Dr. Denyse Amass to confirm lab orders.

## 2022-11-27 NOTE — Addendum Note (Signed)
Addended by: Dierdre Searles on: 11/27/2022 11:39 AM   Modules accepted: Orders

## 2022-11-27 NOTE — Telephone Encounter (Signed)
Called pt and left VM to call the office.  

## 2022-12-24 DIAGNOSIS — L579 Skin changes due to chronic exposure to nonionizing radiation, unspecified: Secondary | ICD-10-CM | POA: Diagnosis not present

## 2022-12-24 DIAGNOSIS — L638 Other alopecia areata: Secondary | ICD-10-CM | POA: Diagnosis not present

## 2022-12-24 DIAGNOSIS — L821 Other seborrheic keratosis: Secondary | ICD-10-CM | POA: Diagnosis not present

## 2022-12-24 DIAGNOSIS — D2271 Melanocytic nevi of right lower limb, including hip: Secondary | ICD-10-CM | POA: Diagnosis not present

## 2022-12-24 DIAGNOSIS — L814 Other melanin hyperpigmentation: Secondary | ICD-10-CM | POA: Diagnosis not present

## 2022-12-27 DIAGNOSIS — Z9889 Other specified postprocedural states: Secondary | ICD-10-CM | POA: Diagnosis not present

## 2022-12-27 DIAGNOSIS — Z8781 Personal history of (healed) traumatic fracture: Secondary | ICD-10-CM | POA: Diagnosis not present

## 2023-01-24 NOTE — Telephone Encounter (Signed)
 VOB initiated for 2025.

## 2023-01-29 NOTE — Telephone Encounter (Addendum)
 Medical [employer] Annette Stable - Prior Authorization REQUIRED  [facility]

## 2023-01-29 NOTE — Telephone Encounter (Deleted)
 Prior Authorization on file - Medical [place] and [employer]nette St[employer]  APPROVED Valid: 11/22/22-11/22/23

## 2023-01-29 NOTE — Telephone Encounter (Deleted)
 Patient is ready for scheduling on or after 01/23/23 BUY AND BILL  Out-of-pocket cost due at time of visit: $343.52  Primary: Medicare Prolia  co-insurance: 20% (approximately $323.52) Admin fee co-insurance: $20  Deductible: does not apply  Prior Auth: APPROVED PA# [id number]  Valid: 11/22/22-11/22/23   Secondary: N/A Prolia  co-insurance:  Admin fee co-insurance:  Deductible:  Prior Auth:  PA# Valid:   ** This summary of benefits is an estimation of the patient's out-of-pocket cost. Exact cost may vary based on individual plan coverage.

## 2023-01-30 NOTE — Telephone Encounter (Signed)
 Pharmacy benefits/PA  This medication or product is on your plan's list of covered drugs. Prior authorization is not required at this time. If your pharmacy has questions regarding the processing of your prescription, please have them call the [facility] pharmacy help desk at [phone]. **Please note: This request was submitted electronically. Formulary lowering, tiering exception, cost reduction and/or pre-benefit determination review (including prospective Medicare hospice reviews) requests cannot be requested using this method of submission. Providers contact us  at [phone] for further assistance.

## 2023-01-30 NOTE — Telephone Encounter (Addendum)
 Medical [facility] [employer] and [employer]te [employer]  APPROVED PA# [id number] Valid:11/22/22-11/22/23

## 2023-02-07 NOTE — Telephone Encounter (Signed)
Medication received, LM for patient to call back to schedule.

## 2023-02-11 NOTE — Telephone Encounter (Signed)
Patient is ready for scheduling on or after 01/23/23 BUY AND BILL  Out-of-pocket cost due at time of visit: $351.87  Primary: Medicare Prolia co-insurance: 20% (approximately $331.87) Admin fee co-insurance: $20  Deductible: does not apply  Prior Auth: APPROVED PA# [id number] Valid:11/22/22-11/22/23  Secondary: N/A Prolia co-insurance:  Admin fee co-insurance:  Deductible:  Prior Auth:  PA# Valid:   ** This summary of benefits is an estimation of the patient's out-of-pocket cost. Exact cost may vary based on individual plan coverage.

## 2023-02-13 ENCOUNTER — Ambulatory Visit: Payer: Medicare Other

## 2023-02-13 DIAGNOSIS — I2510 Atherosclerotic heart disease of native coronary artery without angina pectoris: Secondary | ICD-10-CM | POA: Diagnosis not present

## 2023-02-13 DIAGNOSIS — Z87891 Personal history of nicotine dependence: Secondary | ICD-10-CM | POA: Diagnosis not present

## 2023-02-13 DIAGNOSIS — Z122 Encounter for screening for malignant neoplasm of respiratory organs: Secondary | ICD-10-CM | POA: Diagnosis not present

## 2023-02-13 DIAGNOSIS — J432 Centrilobular emphysema: Secondary | ICD-10-CM | POA: Diagnosis not present

## 2023-02-15 ENCOUNTER — Ambulatory Visit (INDEPENDENT_AMBULATORY_CARE_PROVIDER_SITE_OTHER): Payer: Medicare Other

## 2023-02-15 DIAGNOSIS — Z87310 Personal history of (healed) osteoporosis fracture: Secondary | ICD-10-CM

## 2023-02-15 DIAGNOSIS — M810 Age-related osteoporosis without current pathological fracture: Secondary | ICD-10-CM

## 2023-02-15 MED ORDER — DENOSUMAB 60 MG/ML ~~LOC~~ SOSY
60.0000 mg | PREFILLED_SYRINGE | Freq: Once | SUBCUTANEOUS | Status: AC
  Administered 2023-02-15: 60 mg via SUBCUTANEOUS

## 2023-02-15 NOTE — Progress Notes (Signed)
PATIENT SUPPLIED   Prior Auth: APPROVED PA# [id number] Valid:11/22/22-11/22/23  Pt received Prolia inj, SQ, left arm, mfg: [employer], lot# [id number], exp: 08/21/2025, ndc: [id number]

## 2023-02-15 NOTE — Patient Instructions (Addendum)
[facility] - [phone]  [facility] - [phone]  [facility] - [phone]

## 2023-03-07 DIAGNOSIS — R7989 Other specified abnormal findings of blood chemistry: Secondary | ICD-10-CM | POA: Diagnosis not present

## 2023-03-07 DIAGNOSIS — R5383 Other fatigue: Secondary | ICD-10-CM | POA: Diagnosis not present

## 2023-03-07 DIAGNOSIS — E782 Mixed hyperlipidemia: Secondary | ICD-10-CM | POA: Diagnosis not present

## 2023-03-07 DIAGNOSIS — E063 Autoimmune thyroiditis: Secondary | ICD-10-CM | POA: Diagnosis not present

## 2023-03-07 DIAGNOSIS — D649 Anemia, unspecified: Secondary | ICD-10-CM | POA: Diagnosis not present

## 2023-03-07 DIAGNOSIS — R7309 Other abnormal glucose: Secondary | ICD-10-CM | POA: Diagnosis not present

## 2023-03-07 DIAGNOSIS — E559 Vitamin D deficiency, unspecified: Secondary | ICD-10-CM | POA: Diagnosis not present

## 2023-03-07 LAB — LAB REPORT - SCANNED: EGFR: 73

## 2023-03-14 NOTE — Telephone Encounter (Signed)
 Last Prolia inj 02/15/23 Next Prolia inj due 08/16/23

## 2023-03-18 ENCOUNTER — Ambulatory Visit: Payer: Self-pay | Admitting: Family Medicine

## 2023-03-18 NOTE — Telephone Encounter (Signed)
 Chief Complaint: diarrhea Symptoms: severe diarrhea every hour, weakness from diarrhea Frequency: Onset 2 weeks ago Pertinent Negatives: Patient denies abdominal pain, vomiting, nausea, other symptoms Disposition: [] ED /[] Urgent Care (no appt availability in office) / [x] Appointment(In office/virtual)/ []  [facility]/ [] Home Care/ [] Refused Recommended Disposition /[] [facility]/ []  Follow-up with PCP Additional Notes: Patient reports being on 3 rounds of antibiotics in December for dental work and says the diarrhea started after that. Advised OV for evaluation. She asked what could be done in the office, advised stool testing and treatment based on those results. She agreed to see Dr. Doreene Burke, but asked to be scheduled on Thursday.   Summary: Diarrhea   Copied From CRM [id number]. Reason for Triage: Patient calling in on [facility], has had diarrhea going on for about 2 weeks.  Patient seeking clinical advice     Reason for Disposition  [1] SEVERE diarrhea (e.g., 7 or more times / day more than normal) AND [2] present > 24 hours (1 day)  Answer Assessment - Initial Assessment Questions 1. DIARRHEA SEVERITY: "How bad is the diarrhea?" "How many more stools have you had in the past 24 hours than normal?"    - NO DIARRHEA (SCALE 0)   - MILD (SCALE 1-3): Few loose or mushy BMs; increase of 1-3 stools over normal daily number of stools; mild increase in ostomy output.   -  MODERATE (SCALE 4-7): Increase of 4-6 stools daily over normal; moderate increase in ostomy output.   -  SEVERE (SCALE 8-10; OR "WORST POSSIBLE"): Increase of 7 or more stools daily over normal; moderate increase in ostomy output; incontinence.     Severe going every hour 2. ONSET: "When did the diarrhea begin?"      Around 03/02/23 3. BM CONSISTENCY: "How loose or watery is the diarrhea?"      Watery with pieces 4. VOMITING: "Are you also vomiting?" If Yes, ask: "How many times in the past 24 hours?"       No 5. ABDOMEN PAIN: "Are you having any abdomen pain?" If Yes, ask: "What does it feel like?" (e.g., crampy, dull, intermittent, constant)      No 6. ABDOMEN PAIN SEVERITY: If present, ask: "How bad is the pain?"  (e.g., Scale 1-10; mild, moderate, or severe)   - MILD (1-3): doesn't interfere with normal activities, abdomen soft and not tender to touch    - MODERATE (4-7): interferes with normal activities or awakens from sleep, abdomen tender to touch    - SEVERE (8-10): excruciating pain, doubled over, unable to do any normal activities       No 7. ORAL INTAKE: If vomiting, "Have you been able to drink liquids?" "How much liquids have you had in the past 24 hours?"     About 4 glasses water a day probably 12 oz glasses 8. HYDRATION: "Any signs of dehydration?" (e.g., dry mouth [not just dry lips], too weak to stand, dizziness, new weight loss) "When did you last urinate?"     May have lost weight, no scales at home 9. ANTIBIOTIC USE: "Are you taking antibiotics now or have you taken antibiotics in the past 2 months?"     December on antibiotics for dental work, 3 different courses of amoxicillin 10. OTHER SYMPTOMS: "Do you have any other symptoms?" (e.g., fever, blood in stool)      Weakness  Protocols used: Diarrhea-A-AH

## 2023-03-21 ENCOUNTER — Encounter: Payer: Self-pay | Admitting: Family Medicine

## 2023-03-21 ENCOUNTER — Ambulatory Visit (INDEPENDENT_AMBULATORY_CARE_PROVIDER_SITE_OTHER): Payer: Medicare Other | Admitting: Family Medicine

## 2023-03-21 VITALS — BP 122/70 | HR 74 | Temp 98.2°F | Ht 65.0 in | Wt 130.4 lb

## 2023-03-21 DIAGNOSIS — R82994 Hypercalciuria: Secondary | ICD-10-CM | POA: Diagnosis not present

## 2023-03-21 DIAGNOSIS — R5383 Other fatigue: Secondary | ICD-10-CM | POA: Diagnosis not present

## 2023-03-21 DIAGNOSIS — M810 Age-related osteoporosis without current pathological fracture: Secondary | ICD-10-CM | POA: Diagnosis not present

## 2023-03-21 DIAGNOSIS — M25551 Pain in right hip: Secondary | ICD-10-CM | POA: Diagnosis not present

## 2023-03-21 DIAGNOSIS — Z87310 Personal history of (healed) osteoporosis fracture: Secondary | ICD-10-CM | POA: Diagnosis not present

## 2023-03-21 DIAGNOSIS — R197 Diarrhea, unspecified: Secondary | ICD-10-CM | POA: Insufficient documentation

## 2023-03-21 LAB — COMPREHENSIVE METABOLIC PANEL
ALT: 25 U/L (ref 0–35)
AST: 26 U/L (ref 0–37)
Albumin: 4.6 g/dL (ref 3.5–5.2)
Alkaline Phosphatase: 53 U/L (ref 39–117)
BUN: 10 mg/dL (ref 6–23)
CO2: 25 meq/L (ref 19–32)
Calcium: 9.1 mg/dL (ref 8.4–10.5)
Chloride: 107 meq/L (ref 96–112)
Creatinine, Ser: 0.72 mg/dL (ref 0.40–1.20)
GFR: 86.31 mL/min (ref >60.00)
Glucose, Bld: 98 mg/dL (ref 70–99)
Potassium: 3.3 meq/L — ABNORMAL LOW (ref 3.5–5.1)
Sodium: 140 meq/L (ref 135–145)
Total Bilirubin: 0.7 mg/dL (ref 0.2–1.2)
Total Protein: 7.4 g/dL (ref 6.0–8.3)

## 2023-03-21 LAB — URINALYSIS, ROUTINE W REFLEX MICROSCOPIC
Bilirubin Urine: NEGATIVE
Hgb urine dipstick: NEGATIVE
Ketones, ur: NEGATIVE
Leukocytes,Ua: NEGATIVE
Nitrite: NEGATIVE
RBC / HPF: NONE SEEN (ref 0–?)
Specific Gravity, Urine: <=1.005 — AB (ref 1.000–1.030)
Total Protein, Urine: NEGATIVE
Urine Glucose: NEGATIVE
Urobilinogen, UA: 0.2 (ref 0.0–1.0)
WBC, UA: NONE SEEN (ref 0–?)
pH: 6.5 (ref 5.0–8.0)

## 2023-03-21 NOTE — Addendum Note (Signed)
 Addended by: Larey Dresser on: 03/21/2023 11:54 AM   Modules accepted: Orders

## 2023-03-21 NOTE — Progress Notes (Signed)
 Established Patient Office Visit   Subjective:  Patient ID: Barbara Lawson, female    DOB: 02/19/1955  Age: 68 y.o. MRN: [id number]  Chief Complaint  Patient presents with   Diarrhea    Diarrhea x 2 weeks.     Diarrhea  Pertinent negatives include no abdominal pain, chills or fever.   Encounter Diagnoses  Name Primary?   Diarrhea, unspecified type Yes   Pain of right hip    2-week history of explosive watery diarrhea.  There has been no blood or pus.  Denies abdominal pain or fevers and chills.  Status post 3 rounds of antibiotics back in December well prior to above symptoms.  Brings in labs from her homeopath obtained on the 21st of this month.  normal CBC with differential, CMP, CRP and sed rate.  A1c was 5.7.  She quit vaping.  Complains of right hip pain.  It is difficult for her to walk.  Prior films of the right hip revealed mild osteoarthritis.   Review of Systems  Constitutional:  Negative for chills, fever and malaise/fatigue.  Gastrointestinal:  Positive for diarrhea. Negative for abdominal pain, blood in stool, constipation and melena.  Musculoskeletal:  Positive for joint pain.     Current Outpatient Medications:    ARMOUR THYROID 30 MG tablet, Take 30 mg by mouth every morning., Disp: , Rfl:    ARMOUR THYROID 60 MG tablet, Take 60 mg by mouth every morning., Disp: , Rfl:    atorvastatin (LIPITOR) 10 MG tablet, Take 1 tablet (10 mg total) by mouth daily., Disp: 90 tablet, Rfl: 3   cholecalciferol (VITAMIN D) 25 MCG (1000 UNIT) tablet, Take 2,000 Units by mouth daily., Disp: , Rfl:    clonazePAM (KLONOPIN) 0.5 MG tablet, Take 0.25 mg by mouth at bedtime. Outside prescriber holistic MD Dr. Bing Neighbors, Disp: , Rfl:    denosumab (PROLIA) 60 MG/ML SOSY injection, Inject 60 mg into the skin every 6 (six) months., Disp: , Rfl:    Fluticasone-Umeclidin-Vilant (TRELEGY ELLIPTA) 100-62.5-25 MCG/ACT AEPB, Inhale into the lungs., Disp: , Rfl:     Fluticasone-Umeclidin-Vilant (TRELEGY ELLIPTA) 200-62.5-25 MCG/ACT AEPB, Inhale into the lungs., Disp: , Rfl:    hydrochlorothiazide (HYDRODIURIL) 25 MG tablet, Take 0.5 tablets (12.5 mg total) by mouth daily., Disp: 15 tablet, Rfl: 1   ipratropium (ATROVENT) 0.02 % nebulizer solution, Take 500 mcg by nebulization 4 (four) times daily., Disp: , Rfl:    Ipratropium-Albuterol (COMBIVENT RESPIMAT) 20-100 MCG/ACT AERS respimat, Inhale 1 puff into the lungs every 6 (six) hours as needed for wheezing., Disp: , Rfl:    progesterone (PROMETRIUM) 200 MG capsule, Take 300 mg by mouth at bedtime., Disp: , Rfl:    theophylline (THEODUR) 300 MG 12 hr tablet, Take 1.5 tablets by mouth 2 (two) times daily., Disp: , Rfl:    valACYclovir (VALTREX) 1000 MG tablet, 2 pills x 1 dose then X bid 3-7 days with outbreak until resolution, Disp: 90 tablet, Rfl: 3   gabapentin (NEURONTIN) 100 MG capsule, Take by mouth., Disp: , Rfl:    HYDROcodone-acetaminophen (NORCO) 7.5-325 MG tablet, Take by mouth. From holistic MD Dr. Bing Neighbors, Disp: , Rfl:    Objective:     BP 122/70   Pulse 74   Temp 98.2 F (36.8 C)   Ht 5\' 5"  (1.651 m)   Wt 130 lb 6.4 oz (59.1 kg)   SpO2 98%   BMI 21.70 kg/m  Wt Readings from Last 3 Encounters:  03/21/23 130 lb 6.4  oz (59.1 kg)  11/13/22 124 lb (56.2 kg)  11/05/22 127 lb (57.6 kg)      Physical Exam Constitutional:      General: She is not in acute distress.    Appearance: Normal appearance. She is not ill-appearing, toxic-appearing or diaphoretic.  HENT:     Head: Normocephalic and atraumatic.     Right Ear: External ear normal.     Left Ear: External ear normal.     Mouth/Throat:     Mouth: Mucous membranes are moist.     Pharynx: Oropharynx is clear. No oropharyngeal exudate or posterior oropharyngeal erythema.  Eyes:     General: No scleral icterus.       Right eye: No discharge.        Left eye: No discharge.     Extraocular Movements: Extraocular movements  intact.     Conjunctiva/sclera: Conjunctivae normal.     Pupils: Pupils are equal, round, and reactive to light.  Cardiovascular:     Rate and Rhythm: Normal rate and regular rhythm.  Pulmonary:     Effort: Pulmonary effort is normal. No respiratory distress.     Breath sounds: Normal breath sounds. No wheezing, rhonchi or rales.  Abdominal:     General: Bowel sounds are normal.     Tenderness: There is no right CVA tenderness or left CVA tenderness.  Musculoskeletal:     Cervical back: No rigidity or tenderness.     Right hip: Tenderness (mild ttp of greater trochanter.) present. Normal range of motion.     Left hip: No bony tenderness. Normal range of motion.  Lymphadenopathy:     Cervical: No cervical adenopathy.  Skin:    General: Skin is warm and dry.  Neurological:     Mental Status: She is alert and oriented to person, place, and time.  Psychiatric:        Mood and Affect: Mood normal.        Behavior: Behavior normal.      No results found for any visits on 03/21/23.    The 10-year ASCVD risk score (Arnett DK, et al., 2019) is: 7.7%    Assessment & Plan:   Diarrhea, unspecified type -     Clostridium Difficile by PCR; Future -     Gastrointestinal Panel by PCR , Stool; Future  Pain of right hip -     Ambulatory referral to Sports Medicine    Return in about 4 weeks (around 04/18/2023), or Will consider GI referral..    Mliss Sax, MD

## 2023-03-22 DIAGNOSIS — R197 Diarrhea, unspecified: Secondary | ICD-10-CM | POA: Diagnosis not present

## 2023-03-22 LAB — URINE CULTURE
MICRO NUMBER:: 16138038
Result:: NO GROWTH
SPECIMEN QUALITY:: ADEQUATE

## 2023-03-23 LAB — CLOSTRIDIUM DIFFICILE BY PCR: Toxigenic C. Difficile by PCR: NEGATIVE

## 2023-03-27 LAB — GASTROINTESTINAL PATHOGEN PNL

## 2023-03-28 ENCOUNTER — Telehealth: Payer: Self-pay

## 2023-03-28 NOTE — Telephone Encounter (Signed)
 completed

## 2023-04-03 NOTE — Telephone Encounter (Signed)
 LVM for pt apologizing for the inconvenience and the situation with her test being cancelled. I understand that it is difficult for the pt to come to the office due to the distance from her home. I assured her she would receive thorough instructions whenever she is able to come back and get another stool kit. Advised to not hesitate to call If she has any questions or concerns.

## 2023-04-03 NOTE — Telephone Encounter (Signed)
 Spoke with pt she is content. She let me know she dropped off another sample with more than the previous one to ensure it is enough and will not get cancelled. Advised pt to not hesitate to call if we can do anything for her and MA will call her with results.

## 2023-04-05 LAB — GASTROINTESTINAL PATHOGEN PNL

## 2023-04-08 NOTE — Telephone Encounter (Signed)
 Spoke with [place], CSR with [facility] to receive clarification on the reason for the GI Pathogen Panel w/ PCR being cancelled. Advised to expect a call within 24 hours. Reference # [id number].

## 2023-04-08 NOTE — Telephone Encounter (Signed)
 See and document on 03/28/23 TE for further correspondence.

## 2023-04-09 ENCOUNTER — Telehealth: Payer: Self-pay

## 2023-04-09 NOTE — Telephone Encounter (Signed)
 Forwarding message below

## 2023-04-09 NOTE — Telephone Encounter (Signed)
 Copied from CRM [id number]. Topic: Clinical - Lab/Test Results >> Apr 09, 2023  3:05 PM Florestine Avers wrote: Reason for CRM: Teresea L.   Called in from [employer] to discuss an issue with a lab. She can be reached at [phone]. She is requesting that somebody call her back stat,

## 2023-04-10 ENCOUNTER — Telehealth: Payer: Self-pay | Admitting: Family Medicine

## 2023-04-10 NOTE — Telephone Encounter (Signed)
Please see previous TE.

## 2023-04-10 NOTE — Telephone Encounter (Signed)
 Ms. Rosey Bath called back and I transferred her to cal .

## 2023-04-10 NOTE — Telephone Encounter (Signed)
 Copied from CRM [id number]. Topic: Clinical - Lab/Test Results >> Apr 09, 2023  3:05 PM Florestine Avers wrote: Reason for CRM: Teresea L.   Called in from [employer] to discuss an issue with a lab. She can be reached at [phone]. She is requesting that somebody call her back stat,

## 2023-04-10 NOTE — Telephone Encounter (Signed)
 Barbara Lawson

## 2023-04-10 NOTE — Telephone Encounter (Signed)
 Call from [place] with [facility]. She explained that this specimen was leaking, as the lid was not flush to the container, when it arrived to them. The tech double-bagged the specimen, as they are not permitted to handle specimens. The specimen was shipped to [place] where it is analgized. Upon arrival to the facility in [facility], the majority of the specimen and liquid preservative had leaked from the container, making it impossible to test the specimen. Rosey Bath advises ensuring the cap is on the container tight to prevent this from occurring again in the future.

## 2023-04-11 ENCOUNTER — Telehealth: Payer: Self-pay | Admitting: Family Medicine

## 2023-04-11 NOTE — Telephone Encounter (Signed)
 See 04/09/23 and 04/10/23 TE.

## 2023-04-11 NOTE — Telephone Encounter (Signed)
 Spoke with patient. Notified her the specimen spilled in transit to the lab. Let her know that the Dr. Melynda Keller CMA would be in contact to let her know and answer any questions she may have.

## 2023-04-16 ENCOUNTER — Other Ambulatory Visit: Payer: Self-pay

## 2023-04-16 ENCOUNTER — Telehealth: Payer: Self-pay | Admitting: Family Medicine

## 2023-04-16 DIAGNOSIS — K580 Irritable bowel syndrome with diarrhea: Secondary | ICD-10-CM

## 2023-04-16 NOTE — Telephone Encounter (Signed)
 Copied from CRM [id number]. Topic: Referral - Status >> Apr 16, 2023  8:14 AM [place] E wrote: Reason for CRM: Patient called in wanting the status of her gastroenterology referral. Agent could not find a recent referral for gastro. Callback number for patient is [phone] to discuss.

## 2023-04-18 ENCOUNTER — Encounter: Payer: Self-pay | Admitting: Family Medicine

## 2023-04-18 ENCOUNTER — Ambulatory Visit (INDEPENDENT_AMBULATORY_CARE_PROVIDER_SITE_OTHER): Payer: Medicare Other | Admitting: Family Medicine

## 2023-04-18 VITALS — BP 126/82 | HR 67 | Temp 97.6°F | Ht 65.0 in | Wt 127.4 lb

## 2023-04-18 DIAGNOSIS — R197 Diarrhea, unspecified: Secondary | ICD-10-CM

## 2023-04-18 LAB — COMPREHENSIVE METABOLIC PANEL WITH GFR
ALT: 20 U/L (ref 0–35)
AST: 20 U/L (ref 0–37)
Albumin: 4.4 g/dL (ref 3.5–5.2)
Alkaline Phosphatase: 48 U/L (ref 39–117)
BUN: 15 mg/dL (ref 6–23)
CO2: 26 meq/L (ref 19–32)
Calcium: 9.4 mg/dL (ref 8.4–10.5)
Chloride: 108 meq/L (ref 96–112)
Creatinine, Ser: 0.76 mg/dL (ref 0.40–1.20)
GFR: 80.84 mL/min (ref >60.00)
Glucose, Bld: 90 mg/dL (ref 70–99)
Potassium: 3.7 meq/L (ref 3.5–5.1)
Sodium: 141 meq/L (ref 135–145)
Total Bilirubin: 0.5 mg/dL (ref 0.2–1.2)
Total Protein: 6.7 g/dL (ref 6.0–8.3)

## 2023-04-18 LAB — CBC WITH DIFFERENTIAL/PLATELET
Basophils Absolute: 0.1 10*3/uL (ref 0.0–0.1)
Basophils Relative: 0.7 % (ref 0.0–3.0)
Eosinophils Absolute: 0.1 10*3/uL (ref 0.0–0.7)
Eosinophils Relative: 1.1 % (ref 0.0–5.0)
HCT: 39.8 % (ref 36.0–46.0)
Hemoglobin: 13.3 g/dL (ref 12.0–15.0)
Lymphocytes Relative: 21.6 % (ref 12.0–46.0)
Lymphs Abs: 2.1 10*3/uL (ref 0.7–4.0)
MCHC: 33.4 g/dL (ref 30.0–36.0)
MCV: 89.3 fl (ref 78.0–100.0)
Monocytes Absolute: 0.7 10*3/uL (ref 0.1–1.0)
Monocytes Relative: 7.1 % (ref 3.0–12.0)
Neutro Abs: 6.9 10*3/uL (ref 1.4–7.7)
Neutrophils Relative %: 69.5 % (ref 43.0–77.0)
Platelets: 361 10*3/uL (ref 150.0–400.0)
RBC: 4.46 Mil/uL (ref 3.87–5.11)
RDW: 13.5 % (ref 11.5–15.5)
WBC: 9.9 10*3/uL (ref 4.0–10.5)

## 2023-04-18 LAB — SEDIMENTATION RATE: Sed Rate: 3 mm/h (ref 0–30)

## 2023-04-18 LAB — C-REACTIVE PROTEIN: CRP: <1 mg/dL (ref 0.5–20.0)

## 2023-04-18 MED ORDER — COLESTIPOL HCL 1 G PO TABS: ORAL_TABLET | ORAL | 1 refills | Status: DC

## 2023-04-18 MED ORDER — COLESTIPOL HCL 1 G PO TABS
ORAL_TABLET | ORAL | 1 refills | Status: DC
Start: 2023-04-18 — End: 2023-10-29

## 2023-04-18 NOTE — Progress Notes (Signed)
 Established Patient Office Visit   Subjective:  Patient ID: Barbara Lawson, female    DOB: February 03, 1955  Age: 68 y.o. MRN: [id number]  Chief Complaint  Patient presents with   Diarrhea    4 week follow up for diarrhea. Pt states the diarrhea has slowed down but still present. Pt states normal appetite.     Diarrhea  Pertinent negatives include no abdominal pain or myalgias.   Encounter Diagnoses  Name Primary?   Diarrhea, unspecified type Yes   Watery diarrhea persists.  There has been no fever or chills or significant abdominal pain.  2 stool samples sent previously were insufficient.  The first supplied did not have enough sample.  The second spilled.  Patient has been referred to GI.   Review of Systems  Constitutional: Negative.   HENT: Negative.    Eyes:  Negative for blurred vision, discharge and redness.  Respiratory: Negative.    Cardiovascular: Negative.   Gastrointestinal:  Positive for diarrhea. Negative for abdominal pain.  Genitourinary: Negative.   Musculoskeletal: Negative.  Negative for myalgias.  Skin:  Negative for rash.  Neurological:  Negative for tingling, loss of consciousness and weakness.  Endo/Heme/Allergies:  Negative for polydipsia.     Current Outpatient Medications:    ARMOUR THYROID 30 MG tablet, Take 30 mg by mouth every morning., Disp: , Rfl:    ARMOUR THYROID 60 MG tablet, Take 60 mg by mouth every morning., Disp: , Rfl:    atorvastatin (LIPITOR) 10 MG tablet, Take 1 tablet (10 mg total) by mouth daily., Disp: 90 tablet, Rfl: 3   cholecalciferol (VITAMIN D) 25 MCG (1000 UNIT) tablet, Take 2,000 Units by mouth daily., Disp: , Rfl:    clonazePAM (KLONOPIN) 0.5 MG tablet, Take 0.25 mg by mouth at bedtime. Outside prescriber holistic MD Dr. Bing Neighbors, Disp: , Rfl:    denosumab (PROLIA) 60 MG/ML SOSY injection, Inject 60 mg into the skin every 6 (six) months., Disp: , Rfl:    Fluticasone-Umeclidin-Vilant (TRELEGY ELLIPTA) 100-62.5-25  MCG/ACT AEPB, Inhale into the lungs., Disp: , Rfl:    Fluticasone-Umeclidin-Vilant (TRELEGY ELLIPTA) 200-62.5-25 MCG/ACT AEPB, Inhale into the lungs., Disp: , Rfl:    HYDROcodone-acetaminophen (NORCO) 7.5-325 MG tablet, Take by mouth. From holistic MD Dr. Bing Neighbors, Disp: , Rfl:    ipratropium (ATROVENT) 0.02 % nebulizer solution, Take 500 mcg by nebulization 4 (four) times daily., Disp: , Rfl:    Ipratropium-Albuterol (COMBIVENT RESPIMAT) 20-100 MCG/ACT AERS respimat, Inhale 1 puff into the lungs every 6 (six) hours as needed for wheezing., Disp: , Rfl:    progesterone (PROMETRIUM) 200 MG capsule, Take 300 mg by mouth at bedtime., Disp: , Rfl:    theophylline (THEODUR) 300 MG 12 hr tablet, Take 1.5 tablets by mouth 2 (two) times daily., Disp: , Rfl:    valACYclovir (VALTREX) 1000 MG tablet, 2 pills x 1 dose then X bid 3-7 days with outbreak until resolution, Disp: 90 tablet, Rfl: 3   colestipol (COLESTID) 1 g tablet, Take 2 tablets twice daily with meals, Disp: 120 tablet, Rfl: 1   gabapentin (NEURONTIN) 100 MG capsule, Take by mouth., Disp: , Rfl:    hydrochlorothiazide (HYDRODIURIL) 25 MG tablet, Take 0.5 tablets (12.5 mg total) by mouth daily. (Patient not taking: Reported on 04/18/2023), Disp: 15 tablet, Rfl: 1   Objective:     BP 126/82   Pulse 67   Temp 97.6 F (36.4 C)   Ht 5\' 5"  (1.651 m)   Wt 127 lb 6.4 oz (  57.8 kg)   SpO2 100%   BMI 21.20 kg/m  Wt Readings from Last 3 Encounters:  04/18/23 127 lb 6.4 oz (57.8 kg)  03/21/23 130 lb 6.4 oz (59.1 kg)  11/13/22 124 lb (56.2 kg)      Physical Exam Constitutional:      General: She is not in acute distress.    Appearance: Normal appearance. She is not ill-appearing, toxic-appearing or diaphoretic.  HENT:     Head: Normocephalic and atraumatic.     Right Ear: External ear normal.     Left Ear: External ear normal.     Mouth/Throat:     Mouth: Mucous membranes are moist.     Pharynx: Oropharynx is clear. No  oropharyngeal exudate or posterior oropharyngeal erythema.  Eyes:     General: No scleral icterus.       Right eye: No discharge.        Left eye: No discharge.     Extraocular Movements: Extraocular movements intact.     Conjunctiva/sclera: Conjunctivae normal.     Pupils: Pupils are equal, round, and reactive to light.  Cardiovascular:     Rate and Rhythm: Normal rate and regular rhythm.  Pulmonary:     Effort: Pulmonary effort is normal. No respiratory distress.     Breath sounds: Normal breath sounds.  Abdominal:     General: Bowel sounds are normal.     Tenderness: There is no abdominal tenderness. There is no right CVA tenderness, left CVA tenderness or guarding.  Musculoskeletal:     Cervical back: No rigidity or tenderness.  Skin:    General: Skin is warm and dry.  Neurological:     Mental Status: She is alert and oriented to person, place, and time.  Psychiatric:        Mood and Affect: Mood normal.        Behavior: Behavior normal.      No results found for any visits on 04/18/23.    The 10-year ASCVD risk score (Arnett DK, et al., 2019) is: 8.2%    Assessment & Plan:   Diarrhea, unspecified type -     Clostridium Difficile by PCR -     Gastrointestinal Panel by PCR , Stool; Future -     CBC with Differential/Platelet -     Comprehensive metabolic panel with GFR -     C-reactive protein -     Sedimentation rate -     Colestipol HCl; Take 2 tablets twice daily with meals  Dispense: 120 tablet; Refill: 1    Return in about 3 months (around 07/19/2023).    Mliss Sax, MD

## 2023-04-18 NOTE — Progress Notes (Deleted)
 [facility] [address] [place] [address] Phone: [phone] Subjective:    I'm seeing this patient by the request  of:  Mliss Sax, MD  CC:   [id number]  Barbara Lawson is a 68 y.o. female coming in with complaint of hip pain. Saw Dr. Denyse Amass in October of 2024.  Onset-  Location Duration-  Character- Aggravating factors- Reliving factors-  Therapies tried-  Severity-     Past Medical History:  Diagnosis Date   ANEMIA-NOS 09/28/2009   Aortic atherosclerosis (HCC)    ASTHMA 09/28/2009   moderate persistent    CAD (coronary artery disease)    Colon polyps    COPD (chronic obstructive pulmonary disease) (HCC)    COVID-19    01/20/2020   Emphysema lung (HCC)    vs bronchitis;paraseptal    Herpes    HPV in female    hpv 20+ years    Hyperlipemia    HYPOTHYROIDISM 09/28/2009   Lung nodule    Osteoporosis    Pneumonia    hosp in 2018   Thyroid nodule    right thyroid 5.2 x 1.8 x 1.9 cm right lobe   Past Surgical History:  Procedure Laterality Date   COLONOSCOPY     COLONOSCOPY WITH PROPOFOL N/A 10/30/2021   Procedure: COLONOSCOPY WITH PROPOFOL;  Surgeon: Wyline Mood, MD;  Location: [facility] ENDOSCOPY;  Service: Gastroenterology;  Laterality: N/A;   ESOPHAGOGASTRODUODENOSCOPY N/A 10/30/2021   Procedure: ESOPHAGOGASTRODUODENOSCOPY (EGD);  Surgeon: Wyline Mood, MD;  Location: [facility] ENDOSCOPY;  Service: Gastroenterology;  Laterality: N/A;   WRIST SURGERY Right 05/03/2022   Social History   Socioeconomic History   Marital status: Divorced    Spouse name: Not on file   Number of children: Not on file   Years of education: Not on file   Highest education level: Bachelor's degree (e.g., BA, AB, BS)  Occupational History   Not on file  Tobacco Use   Smoking status: Former    Current packs/day: 0.00    Types: Cigarettes    Quit date: 01/26/2013    Years since quitting: 10.2   Smokeless tobacco: Never  Vaping Use    Vaping status: Former  Substance and Sexual Activity   Alcohol use: Yes    Alcohol/week: 3.0 standard drinks of alcohol    Types: 3 Standard drinks or equivalent per week   Drug use: No   Sexual activity: Not Currently  Other Topics Concern   Not on file  Social History Narrative   Lives alone with dog    Moved from [place]    Bachelors degree massage therapy    Age 43 y.o abortion    lmp age 11    Social Drivers of Health   Financial Resource Strain: Low Risk  (03/20/2023)   Overall Financial Resource Strain (CARDIA)    Difficulty of Paying Living Expenses: Not hard at all  Food Insecurity: No Food Insecurity (03/20/2023)   Hunger Vital Sign    Worried About Running Out of Food in the Last Year: Never true    Ran Out of Food in the Last Year: Never true  Transportation Needs: No Transportation Needs (03/20/2023)   PRAPARE - Administrator, Civil Service (Medical): No    Lack of Transportation (Non-Medical): No  Physical Activity: Sufficiently Active (03/20/2023)   Exercise Vital Sign    Days of Exercise per Week: 3 days    Minutes of Exercise per Session: 60 min  Stress: No Stress Concern Present (03/20/2023)   [employer] of Occupational Health - Occupational Stress Questionnaire    Feeling of Stress : Only a little  Social Connections: Socially Isolated (03/20/2023)   Social Connection and Isolation Panel [NHANES]    Frequency of Communication with Friends and Family: Once a week    Frequency of Social Gatherings with Friends and Family: Once a week    Attends Religious Services: Never    Database administrator or Organizations: No    Attends Engineer, structural: Not on file    Marital Status: Divorced   Allergies  Allergen Reactions   Misc. Sulfonamide Containing Compounds Rash and Swelling    Patient Reports It Only Occurred with The Suppository Formulation, Patient Reports It Only Occurred with The Suppository Formulation, Patient Reports It  Only Occurred with The Suppository Formulation   Sulfa Antibiotics Rash and Swelling    Patient Reports It Only Occurred with The Suppository Formulation  Patient Reports It Only Occurred with The Suppository Formulation Patient Reports It Only Occurred with The Suppository Formulation Patient Reports It Only Occurred with The Suppository Formulation  Patient Reports It Only Occurred with The Suppository Formulation  Patient Reports It Only Occurred with The Suppository Formulation Patient Reports It Only Occurred with The Suppository Formulation Patient Reports It Only Occurred with The Suppository Formulation    Patient Reports It Only Occurred with The Suppository Formulation Patient Reports It Only Occurred with The Suppository Formulation Patient Reports It Only Occurred with The Suppository Formulation    Patient Reports It Only Occurred with The Suppository Formulation   Cat Dander    Dust Mite Extract     Other Reaction(s): Other (See Comments)  Bothers asthma     Bothers asthma   Sulfonamide Derivatives    Family History  Problem Relation Age of Onset   Cancer Mother    Asthma Father    Cancer Father    Hyperlipidemia Father    Alzheimer's disease Father    Hyperlipidemia Brother     Current Outpatient Medications (Endocrine & Metabolic):    ARMOUR THYROID 30 MG tablet, Take 30 mg by mouth every morning.   ARMOUR THYROID 60 MG tablet, Take 60 mg by mouth every morning.   denosumab (PROLIA) 60 MG/ML SOSY injection, Inject 60 mg into the skin every 6 (six) months.   progesterone (PROMETRIUM) 200 MG capsule, Take 300 mg by mouth at bedtime.  Current Outpatient Medications (Cardiovascular):    atorvastatin (LIPITOR) 10 MG tablet, Take 1 tablet (10 mg total) by mouth daily.   hydrochlorothiazide (HYDRODIURIL) 25 MG tablet, Take 0.5 tablets (12.5 mg total) by mouth daily.  Current Outpatient Medications (Respiratory):    Fluticasone-Umeclidin-Vilant (TRELEGY ELLIPTA)  100-62.5-25 MCG/ACT AEPB, Inhale into the lungs.   Fluticasone-Umeclidin-Vilant (TRELEGY ELLIPTA) 200-62.5-25 MCG/ACT AEPB, Inhale into the lungs.   ipratropium (ATROVENT) 0.02 % nebulizer solution, Take 500 mcg by nebulization 4 (four) times daily.   Ipratropium-Albuterol (COMBIVENT RESPIMAT) 20-100 MCG/ACT AERS respimat, Inhale 1 puff into the lungs every 6 (six) hours as needed for wheezing.   theophylline (THEODUR) 300 MG 12 hr tablet, Take 1.5 tablets by mouth 2 (two) times daily.  Current Outpatient Medications (Analgesics):    HYDROcodone-acetaminophen (NORCO) 7.5-325 MG tablet, Take by mouth. From holistic MD Dr. Bing Neighbors   Current Outpatient Medications (Other):    cholecalciferol (VITAMIN D) 25 MCG (1000 UNIT) tablet, Take 2,000 Units by mouth daily.   clonazePAM (KLONOPIN) 0.5 MG tablet, Take 0.25  mg by mouth at bedtime. Outside prescriber holistic MD Dr. Lyn Hollingshead Augustides   gabapentin (NEURONTIN) 100 MG capsule, Take by mouth.   valACYclovir (VALTREX) 1000 MG tablet, 2 pills x 1 dose then X bid 3-7 days with outbreak until resolution   Reviewed prior external information including notes and imaging from  primary care provider As well as notes that were available from care everywhere and other healthcare systems.  Past medical history, social, surgical and family history all reviewed in electronic medical record.  No pertanent information unless stated regarding to the chief complaint.   Review of Systems:  No headache, visual changes, nausea, vomiting, diarrhea, constipation, dizziness, abdominal pain, skin rash, fevers, chills, night sweats, weight loss, swollen lymph nodes, body aches, joint swelling, chest pain, shortness of breath, mood changes. POSITIVE muscle aches  Objective  There were no vitals taken for this visit.   General: No apparent distress alert and oriented x3 mood and affect normal, dressed appropriately.  HEENT: Pupils equal, extraocular  movements intact  Respiratory: Patient's speak in full sentences and does not appear short of breath  Cardiovascular: No lower extremity edema, non tender, no erythema      Impression and Recommendations:

## 2023-04-23 ENCOUNTER — Telehealth: Payer: Self-pay

## 2023-04-23 ENCOUNTER — Ambulatory Visit: Admitting: Family Medicine

## 2023-04-23 ENCOUNTER — Other Ambulatory Visit: Payer: Self-pay | Admitting: Family Medicine

## 2023-04-23 DIAGNOSIS — R197 Diarrhea, unspecified: Secondary | ICD-10-CM | POA: Diagnosis not present

## 2023-04-23 NOTE — Telephone Encounter (Signed)
 Copied from CRM [id number]. Topic: Referral - Status >> Apr 23, 2023  9:44 AM Barbara Lawson wrote: Reason for CRM: pt called to follow up on the states of her referral for Gastroenterologist. Pt states it has been  over 6 weeks and she has not heard for anyone. Please call pt with and updated at [phone]

## 2023-04-23 NOTE — Telephone Encounter (Signed)
 Information regarding referral was sent to Pt via MyChart

## 2023-04-24 ENCOUNTER — Telehealth: Payer: Self-pay

## 2023-04-24 NOTE — Telephone Encounter (Signed)
 Patient called and left a voicemail to schedule an appointment due to IBS-diarrhea. Can you please call her and schedule an appointment with Dr. Tobi Bastos when he has one. I can't double book him any longer. Therefore, schedule whenever he has an opening. Thank you.

## 2023-04-25 LAB — CLOSTRIDIUM DIFFICILE BY PCR: Toxigenic C. Difficile by PCR: NEGATIVE

## 2023-04-25 NOTE — Telephone Encounter (Signed)
 Barbara Lawson called the patient and scheduled a follow up.

## 2023-04-28 ENCOUNTER — Encounter: Payer: Self-pay | Admitting: Family Medicine

## 2023-04-29 DIAGNOSIS — S52501D Unspecified fracture of the lower end of right radius, subsequent encounter for closed fracture with routine healing: Secondary | ICD-10-CM | POA: Diagnosis not present

## 2023-04-29 DIAGNOSIS — Z8781 Personal history of (healed) traumatic fracture: Secondary | ICD-10-CM | POA: Diagnosis not present

## 2023-04-29 DIAGNOSIS — S52201D Unspecified fracture of shaft of right ulna, subsequent encounter for closed fracture with routine healing: Secondary | ICD-10-CM | POA: Diagnosis not present

## 2023-04-29 DIAGNOSIS — Z9889 Other specified postprocedural states: Secondary | ICD-10-CM | POA: Diagnosis not present

## 2023-05-24 ENCOUNTER — Other Ambulatory Visit: Payer: Self-pay | Admitting: Family Medicine

## 2023-05-24 DIAGNOSIS — R197 Diarrhea, unspecified: Secondary | ICD-10-CM

## 2023-05-30 ENCOUNTER — Other Ambulatory Visit: Payer: Self-pay

## 2023-05-30 DIAGNOSIS — B001 Herpesviral vesicular dermatitis: Secondary | ICD-10-CM | POA: Insufficient documentation

## 2023-05-30 HISTORY — DX: Herpesviral vesicular dermatitis: B00.1

## 2023-06-04 ENCOUNTER — Ambulatory Visit: Admitting: Gastroenterology

## 2023-06-04 VITALS — BP 153/88 | HR 103 | Temp 98.4°F | Wt 122.8 lb

## 2023-06-04 DIAGNOSIS — K589 Irritable bowel syndrome without diarrhea: Secondary | ICD-10-CM | POA: Diagnosis not present

## 2023-06-04 DIAGNOSIS — K588 Other irritable bowel syndrome: Secondary | ICD-10-CM

## 2023-06-04 NOTE — Progress Notes (Signed)
 Luke Salaam MD, MRCP(U.K) [address]  [address]  [place], [place] [address]  Main: [phone]  Fax: [phone]   Primary Care Physician: [name], MD  Primary Gastroenterologist:  Dr. Luke Salaam   Chief Complaint  Patient presents with   Irritable Bowel Syndrome    HPI: Barbara Lawson is a 68 y.o. female   Summary of history :  Back in 2023 she was seen at the office for diarrhea after a RV trip.  23 polyps were taken out during her last colonoscopy in 2018 and 20 polyps in 2017.  Since no further diarrhea stool testing was deferred.  She underwent a colonoscopy in October 2023.  Found to have diverticulosis of sigmoid colon.  5 mm polyp was resected in the ascending colon with a cold snare.  The polyp was benign.  Interval history   04/23/2023: C. difficile PCR negative,CRP less than 1, ESR 3, CMP normal, CBC normal, GI PCR was ordered but not obtained She says in February of this year she had 3 rounds of antibiotics for some dental work.  Subsequently developed significant diarrhea.  She was given colestipol .  Since then she has had a change in her bowel habits not clear if she is having diarrhea which she denies but when asked if she is constipated she says she finds it hard to push out the stool but when she sees it in the potty it is not formed.  I could not get a clear understanding if she was having diarrhea or constipation or just an unsatisfactory bowel movement.  She has been consuming yogurt.  Complains of a lot of bloating.  Current Outpatient Medications  Medication Sig Dispense Refill   ARMOUR THYROID  30 MG tablet Take 30 mg by mouth every morning.     ARMOUR THYROID  60 MG tablet Take 60 mg by mouth every morning.     atorvastatin  (LIPITOR) 10 MG tablet Take 1 tablet (10 mg total) by mouth daily. 90 tablet 3   cholecalciferol (VITAMIN D ) 25 MCG (1000 UNIT) tablet Take 2,000 Units by mouth daily.     clonazePAM (KLONOPIN) 0.5 MG tablet Take  0.25 mg by mouth at bedtime. Outside prescriber holistic MD Dr. Janne Members     colestipol  (COLESTID ) 1 g tablet Take 2 tablets twice daily with meals 120 tablet 1   denosumab  (PROLIA ) 60 MG/ML SOSY injection Inject 60 mg into the skin every 6 (six) months.     Fluticasone -Umeclidin-Vilant (TRELEGY ELLIPTA) 100-62.5-25 MCG/ACT AEPB Inhale into the lungs.     Fluticasone -Umeclidin-Vilant (TRELEGY ELLIPTA) 200-62.5-25 MCG/ACT AEPB Inhale into the lungs.     hydrochlorothiazide  (HYDRODIURIL ) 25 MG tablet Take 0.5 tablets (12.5 mg total) by mouth daily. 15 tablet 1   ipratropium (ATROVENT) 0.02 % nebulizer solution Take 500 mcg by nebulization 4 (four) times daily.     Ipratropium-Albuterol (COMBIVENT RESPIMAT) 20-100 MCG/ACT AERS respimat Inhale 1 puff into the lungs every 6 (six) hours as needed for wheezing.     ondansetron  (ZOFRAN ) 4 MG tablet      progesterone (PROMETRIUM) 200 MG capsule Take 300 mg by mouth at bedtime.     theophylline  (THEODUR) 300 MG 12 hr tablet Take 1.5 tablets by mouth 2 (two) times daily.     valACYclovir  (VALTREX ) 1000 MG tablet 2 pills x 1 dose then X bid 3-7 days with outbreak until resolution 90 tablet 3   varenicline  (CHANTIX ) 0.5 MG tablet PLEASE SEE ATTACHED FOR DETAILED DIRECTIONS  gabapentin (NEURONTIN) 100 MG capsule Take by mouth.     No current facility-administered medications for this visit.    Allergies as of 06/04/2023 - Review Complete 06/04/2023  Allergen Reaction Noted   Misc. sulfonamide containing compounds Rash and Swelling 02/07/2015   Sulfa antibiotics Rash and Swelling 09/28/2009   Cat dander  06/24/2020   Dust mite extract  06/24/2020   Sulfonamide derivatives  09/28/2009     ROS:  General: Negative for anorexia, weight loss, fever, chills, fatigue, weakness. ENT: Negative for hoarseness, difficulty swallowing , nasal congestion. CV: Negative for chest pain, angina, palpitations, dyspnea on exertion, peripheral edema.   Respiratory: Negative for dyspnea at rest, dyspnea on exertion, cough, sputum, wheezing.  GI: See history of present illness. GU:  Negative for dysuria, hematuria, urinary incontinence, urinary frequency, nocturnal urination.  Endo: Negative for unusual weight change.    Physical Examination:   BP (!) 153/88   Pulse (!) 103   Temp 98.4 F (36.9 C) (Oral)   Wt 122 lb 12.8 oz (55.7 kg)   BMI 20.43 kg/m   General: Well-nourished, well-developed in no acute distress.  Eyes: No icterus. Conjunctivae pink. Neuro: Alert and oriented x 3.  Grossly intact. Skin: Warm and dry, no jaundice.   Psych: Alert and cooperative, normal mood and affect.   Imaging Studies: No results found.  Assessment and Plan:   Barbara Lawson is a 68 y.o. y/o female here to see me about a change in bowel habit after 3 rounds of antibiotics in February 2025 after some dental work.  Initially began with diarrhea followed by difficulty evacuating her bowel contents but she denies any constipation.  Lots of bloating.  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  I explained to her that her symptoms may be exaggerated by effects of colestipol  which may be causing her to have a degree of constipation at this point of time and the simplest option would be to reduce the dose of colestipol  and watch and see how she does for a few days.  If it makes her feel better and she can further get off the colestipol  and see how she does.  Usually postinfectious IBS last for 6 to 8 weeks and I would expect things to be getting better at this point of time.  For the bloating she can use as needed charcoal tablets patient information provided about the same.  I advised her to avoid artificial sugars and sweeteners.  She consumes some nicotine gums which may have artificial sugars which may cause bloating.  We did talk about SIBO but the issue with SIBO is that  if she does have the treatment would again be antibiotics which I would prefer to avoid at this point of time.  She will message me via MyChart in 7 to 10 days time letting me know how she is doing.  Since we did not have a clear understanding about how her bowel movements are I advised her to take pictures on her phone and send it via MyChart for me to understand     Dr Luke Salaam  MD,MRCP [facility]) Follow up in as needed

## 2023-06-04 NOTE — Patient Instructions (Addendum)
 Please decrease Colestipol  to twice a day.  Please take FD Guard as directed on the box.  Please take charcoal tablets.

## 2023-06-04 NOTE — Progress Notes (Unsigned)
 [name] [address] [place] [address] Phone: [phone] Subjective:   Barbara Lawson, am serving as a scribe for Dr. Ronnell Coins.  I'm seeing this patient by the request  of:  [name], MD  CC: Right hip pain  [id number]  Barbara Lawson is a 68 y.o. female coming in with complaint of B hip pain for past few years.  Past medical history significant for osteoporosis, lung nodules, scoliosis.  Patient was seen 1 year ago and reviewed imaging from another provider.  Patient did have x-rays of the hip at that time that showed mild arthritic changes noted.  Subchondral cyst formation noted to the superior acetabulum.  Patient states that she has pain over the greater trochanter and the ASIS. Painful with sit to stand and walking. R>L. Patient has tried stretching and strengthening. Uses ice, massage, TENS. Denies any back pain.      Past Medical History:  Diagnosis Date   ANEMIA-NOS 09/28/2009   Aortic atherosclerosis (HCC)    ASTHMA 09/28/2009   moderate persistent    CAD (coronary artery disease)    Colon polyps    COPD (chronic obstructive pulmonary disease) (HCC)    COVID-19    01/20/2020   Emphysema lung (HCC)    vs bronchitis;paraseptal    Herpes    Herpes labialis 05/30/2023   HPV in female    hpv 20+ years    Hyperlipemia    HYPOTHYROIDISM 09/28/2009   Lung nodule    Osteoporosis    Pneumonia    hosp in 2018   Thyroid  nodule    right thyroid  5.2 x 1.8 x 1.9 cm right lobe   Past Surgical History:  Procedure Laterality Date   COLONOSCOPY     COLONOSCOPY WITH PROPOFOL  N/A 10/30/2021   Procedure: COLONOSCOPY WITH PROPOFOL ;  Surgeon: Luke Salaam, MD;  Location: [facility] ENDOSCOPY;  Service: Gastroenterology;  Laterality: N/A;   ESOPHAGOGASTRODUODENOSCOPY N/A 10/30/2021   Procedure: ESOPHAGOGASTRODUODENOSCOPY (EGD);  Surgeon: Luke Salaam, MD;  Location: [facility] ENDOSCOPY;  Service: Gastroenterology;  Laterality:  N/A;   WRIST SURGERY Right 05/03/2022   Social History   Socioeconomic History   Marital status: Divorced    Spouse name: Not on file   Number of children: Not on file   Years of education: Not on file   Highest education level: Bachelor's degree (e.g., BA, AB, BS)  Occupational History   Not on file  Tobacco Use   Smoking status: Former    Current packs/day: 0.00    Types: Cigarettes    Quit date: 01/26/2013    Years since quitting: 10.3   Smokeless tobacco: Never  Vaping Use   Vaping status: Former  Substance and Sexual Activity   Alcohol use: Yes    Alcohol/week: 3.0 standard drinks of alcohol    Types: 3 Standard drinks or equivalent per week   Drug use: No   Sexual activity: Not Currently  Other Topics Concern   Not on file  Social History Narrative   Lives alone with dog    [place]    Bachelors degree massage therapy    Age 65 y.o abortion    lmp age 36    Social Drivers of Health   Financial Resource Strain: Low Risk  (03/20/2023)   Overall Financial Resource Strain (CARDIA)    Difficulty of Paying Living Expenses: Not hard at all  Food Insecurity: No Food Insecurity (03/20/2023)   Hunger Vital Sign  Worried About Programme researcher, broadcasting/film/video in the Last Year: Never true    Ran Out of Food in the Last Year: Never true  Transportation Needs: No Transportation Needs (03/20/2023)   PRAPARE - Administrator, Civil Service (Medical): No    Lack of Transportation (Non-Medical): No  Physical Activity: Sufficiently Active (03/20/2023)   Exercise Vital Sign    Days of Exercise per Week: 3 days    Minutes of Exercise per Session: 60 min  Stress: No Stress Concern Present (03/20/2023)   [employer] of Occupational Health - Occupational Stress Questionnaire    Feeling of Stress : Only a little  Social Connections: Socially Isolated (03/20/2023)   Social Connection and Isolation Panel [NHANES]    Frequency of Communication with Friends and Family: Once  a week    Frequency of Social Gatherings with Friends and Family: Once a week    Attends Religious Services: Never    Database administrator or Organizations: No    Attends Engineer, structural: Not on file    Marital Status: Divorced   Allergies  Allergen Reactions   Misc. Sulfonamide Containing Compounds Rash and Swelling    Patient Reports It Only Occurred with The Suppository Formulation, Patient Reports It Only Occurred with The Suppository Formulation, Patient Reports It Only Occurred with The Suppository Formulation   Sulfa Antibiotics Rash and Swelling    Patient Reports It Only Occurred with The Suppository Formulation  Patient Reports It Only Occurred with The Suppository Formulation Patient Reports It Only Occurred with The Suppository Formulation Patient Reports It Only Occurred with The Suppository Formulation  Patient Reports It Only Occurred with The Suppository Formulation  Patient Reports It Only Occurred with The Suppository Formulation Patient Reports It Only Occurred with The Suppository Formulation Patient Reports It Only Occurred with The Suppository Formulation    Patient Reports It Only Occurred with The Suppository Formulation Patient Reports It Only Occurred with The Suppository Formulation Patient Reports It Only Occurred with The Suppository Formulation    Patient Reports It Only Occurred with The Suppository Formulation   Cat Dander    Dust Mite Extract     Other Reaction(s): Other (See Comments)  Bothers asthma     Bothers asthma   Sulfonamide Derivatives    Family History  Problem Relation Age of Onset   Cancer Mother    Asthma Father    Cancer Father    Hyperlipidemia Father    Alzheimer's disease Father    Hyperlipidemia Brother     Current Outpatient Medications (Endocrine & Metabolic):    ARMOUR THYROID  30 MG tablet, Take 30 mg by mouth every morning.   ARMOUR THYROID  60 MG tablet, Take 60 mg by mouth every morning.   denosumab   (PROLIA ) 60 MG/ML SOSY injection, Inject 60 mg into the skin every 6 (six) months.   progesterone (PROMETRIUM) 200 MG capsule, Take 300 mg by mouth at bedtime.  Current Outpatient Medications (Cardiovascular):    atorvastatin  (LIPITOR) 10 MG tablet, Take 1 tablet (10 mg total) by mouth daily.   colestipol  (COLESTID ) 1 g tablet, Take 2 tablets twice daily with meals   hydrochlorothiazide  (HYDRODIURIL ) 25 MG tablet, Take 0.5 tablets (12.5 mg total) by mouth daily.  Current Outpatient Medications (Respiratory):    Fluticasone -Umeclidin-Vilant (TRELEGY ELLIPTA) 100-62.5-25 MCG/ACT AEPB, Inhale into the lungs.   Fluticasone -Umeclidin-Vilant (TRELEGY ELLIPTA) 200-62.5-25 MCG/ACT AEPB, Inhale into the lungs.   ipratropium (ATROVENT) 0.02 % nebulizer solution, Take 500 mcg by nebulization  4 (four) times daily.   Ipratropium-Albuterol (COMBIVENT RESPIMAT) 20-100 MCG/ACT AERS respimat, Inhale 1 puff into the lungs every 6 (six) hours as needed for wheezing.   theophylline  (THEODUR) 300 MG 12 hr tablet, Take 1.5 tablets by mouth 2 (two) times daily.    Current Outpatient Medications (Other):    cholecalciferol (VITAMIN D ) 25 MCG (1000 UNIT) tablet, Take 2,000 Units by mouth daily.   clonazePAM (KLONOPIN) 0.5 MG tablet, Take 0.25 mg by mouth at bedtime. Outside prescriber holistic MD Dr. Janne Members   ondansetron  (ZOFRAN ) 4 MG tablet,    valACYclovir  (VALTREX ) 1000 MG tablet, 2 pills x 1 dose then X bid 3-7 days with outbreak until resolution   varenicline  (CHANTIX ) 0.5 MG tablet, PLEASE SEE ATTACHED FOR DETAILED DIRECTIONS   gabapentin (NEURONTIN) 100 MG capsule, Take by mouth.   Reviewed prior external information including notes and imaging from  primary care provider As well as notes that were available from care everywhere and other healthcare systems.  Past medical history, social, surgical and family history all reviewed in electronic medical record.  No pertanent information unless  stated regarding to the chief complaint.   Review of Systems:  No headache, visual changes, nausea, vomiting, diarrhea, constipation, dizziness, abdominal pain, skin rash, fevers, chills, night sweats, weight loss, swollen lymph nodes, body aches, joint swelling, chest pain, shortness of breath, mood changes. POSITIVE muscle aches  Objective  Blood pressure 112/86, pulse (!) 121, height 5\' 5"  (1.651 m), weight 122 lb (55.3 kg), SpO2 98%.   General: No apparent distress alert and oriented x3 mood and affect normal, dressed appropriately.  HEENT: Pupils equal, extraocular movements intact  Respiratory: Patient's speak in full sentences and does not appear short of breath  Cardiovascular: No lower extremity edema, non tender, no erythema  Hip exam shows patient is severely tender over the gluteal tendon noted on the lateral aspect.  Significant tightness noted of the psoas muscles bilaterally.  Procedure: Real-time Ultrasound Guided Injection of right gluteal tendon sheath Device: GE Logiq Q7 Ultrasound guided injection is preferred based studies that show increased duration, increased effect, greater accuracy, decreased procedural pain, increased response rate, and decreased cost with ultrasound guided versus blind injection.  Verbal informed consent obtained.  Time-out conducted.  Noted no overlying erythema, induration, or other signs of local infection.  Skin prepped in a sterile fashion.  Local anesthesia: Topical Ethyl chloride.  With sterile technique and under real time ultrasound guidance: With a 21-gauge 2 inch needle injected with 1 cc of 0.5% Marcaine  and 1 cc of Kenalog 40 mg/mL. Completed without difficulty  Pain immediately resolved suggesting accurate placement of the medication.  Advised to call if fevers/chills, erythema, induration, drainage, or persistent bleeding.  Impression: Technically successful ultrasound guided injection.   Impression and Recommendations:      The above documentation has been reviewed and is accurate and complete Ayan Heffington M Clydine Parkison, DO

## 2023-06-05 ENCOUNTER — Ambulatory Visit (INDEPENDENT_AMBULATORY_CARE_PROVIDER_SITE_OTHER)

## 2023-06-05 ENCOUNTER — Ambulatory Visit: Admitting: Family Medicine

## 2023-06-05 ENCOUNTER — Encounter: Payer: Self-pay | Admitting: Family Medicine

## 2023-06-05 ENCOUNTER — Other Ambulatory Visit: Payer: Self-pay

## 2023-06-05 VITALS — BP 112/86 | HR 121 | Ht 65.0 in | Wt 122.0 lb

## 2023-06-05 DIAGNOSIS — M7601 Gluteal tendinitis, right hip: Secondary | ICD-10-CM

## 2023-06-05 DIAGNOSIS — M160 Bilateral primary osteoarthritis of hip: Secondary | ICD-10-CM | POA: Diagnosis not present

## 2023-06-05 DIAGNOSIS — S7001XS Contusion of right hip, sequela: Secondary | ICD-10-CM | POA: Diagnosis not present

## 2023-06-05 DIAGNOSIS — M25551 Pain in right hip: Secondary | ICD-10-CM

## 2023-06-05 DIAGNOSIS — M51369 Other intervertebral disc degeneration, lumbar region without mention of lumbar back pain or lower extremity pain: Secondary | ICD-10-CM | POA: Diagnosis not present

## 2023-06-05 NOTE — Assessment & Plan Note (Signed)
 Has fallen on the hip previously and will need to monitor.  Concerned that there is a possibility also of some arthritic arthritis that could be contributing and will get x-rays.

## 2023-06-05 NOTE — Patient Instructions (Addendum)
 Xray today Do prescribed exercises at least 3x a week  Isometrics only for core Lower incline on total gyms See you again in 6-8 weeks

## 2023-06-05 NOTE — Assessment & Plan Note (Signed)
 Injection given today and tolerated the procedure well.  Do not believe that the patient has some weakness noted.  This could be secondary to lumbar radiculopathy but I think it is more secondary to muscle imbalances.  Having catching in her hip that could be potential impingement.  Will get repeat x-rays from a year ago.  Depending on findings on the x-ray this could change medical management as well.  Did not give any medications.  Discussed icing regimen.  Follow-up again in 6 to 8 weeks.

## 2023-06-09 ENCOUNTER — Ambulatory Visit: Payer: Self-pay | Admitting: Family Medicine

## 2023-06-26 ENCOUNTER — Other Ambulatory Visit: Payer: Self-pay | Admitting: Family Medicine

## 2023-06-26 DIAGNOSIS — E063 Autoimmune thyroiditis: Secondary | ICD-10-CM | POA: Diagnosis not present

## 2023-06-26 DIAGNOSIS — R197 Diarrhea, unspecified: Secondary | ICD-10-CM

## 2023-06-26 DIAGNOSIS — R5383 Other fatigue: Secondary | ICD-10-CM | POA: Diagnosis not present

## 2023-07-08 NOTE — Telephone Encounter (Signed)
 [facility] [employer] [facility] [employer] - Prior Authorization REQUIRED for [employer]  PA PROCESS DETAILS: Please ensure your patient meets medical necessity by reviewing Medical Policy  [phone] at [email or web address], and obtaining a pre-determination by contacting the PA dept at [phone].

## 2023-07-10 NOTE — Telephone Encounter (Signed)
 SPECIALTY PHARMACY      CMP completed 04/18/23 - WNL   Result Notes     View Follow-Up Encounter        Component Ref Range & Units (hover) 2 mo ago 3 mo ago 7 mo ago 9 mo ago 3 yr ago  Sodium 141 140 140 140 142  Potassium 3.7 3.3 Low  4.2 4.7 3.9  Chloride 108 107 106 106 106  CO2 26 25 26 27 23   Glucose, Bld 90 98 121 High  92 93  BUN 15 10 17 12 14   Creatinine, Ser 0.76 0.72 0.77 0.74 0.77  Total Bilirubin 0.5 0.7 0.5 0.6 0.4  Alkaline Phosphatase 48 53 47 53 57  AST 20 26 21 18 15   ALT 20 25 22 17 14   Total Protein 6.7 7.4 6.8 7.0 6.3  Albumin 4.4 4.6 4.3 4.5 4.3  GFR 80.84 86.31 CM 79.82 CM 83.79 CM 81.17 CM  Comment: Calculated using the CKD-EPI Creatinine Equation (2021)  Calcium  9.4 9.1 9.7 10.1 9.5

## 2023-07-15 ENCOUNTER — Other Ambulatory Visit: Payer: Self-pay | Admitting: Family Medicine

## 2023-07-15 NOTE — Telephone Encounter (Signed)
 Needs PA renewal?

## 2023-07-16 DIAGNOSIS — Z1231 Encounter for screening mammogram for malignant neoplasm of breast: Secondary | ICD-10-CM | POA: Diagnosis not present

## 2023-07-16 LAB — HM MAMMOGRAPHY

## 2023-07-17 ENCOUNTER — Encounter: Payer: Self-pay | Admitting: Obstetrics and Gynecology

## 2023-07-17 ENCOUNTER — Other Ambulatory Visit (HOSPITAL_COMMUNITY): Payer: Self-pay

## 2023-07-17 ENCOUNTER — Other Ambulatory Visit: Payer: Self-pay

## 2023-07-17 NOTE — Telephone Encounter (Signed)
 Pharmacy Benefit  Prior Authorization initiated for PROLIA  via CoverMyMeds.[email or web address] KEY: [id number]  This medication or product is on your plan's list of covered drugs. Prior authorization is not required at this time.     [facility] - $300 copay

## 2023-07-17 NOTE — Telephone Encounter (Signed)
 Called and spoke with patient. I will check benefits for pharmacy and medical benefits and call pt back to decide which route to take. Will likely prefer pharmacy benefit if it will apply to the OOP.

## 2023-07-18 NOTE — Telephone Encounter (Signed)
 PA initiated via Cover My Meds

## 2023-07-22 NOTE — Telephone Encounter (Signed)
 Me to Barbara Lawson     07/22/23 12:41 PM Good Morning Barbara Lawson,  I heard back from my contact at the [facility], she said that the co-pay for Prolia  will be $300. Would you like for me to send the prescription there? If so, they will reach out to confirm insurance benefits and go over out-of-pocket cost, then they will ship it to the office. It looks like using the pharmacy benefit will be our most cost-effective option.    Let me know what you think,    Barbara Lawson, CMA  This MyChart message has not been read.

## 2023-07-23 ENCOUNTER — Other Ambulatory Visit: Payer: Self-pay | Admitting: Pharmacy Technician

## 2023-07-23 ENCOUNTER — Other Ambulatory Visit: Payer: Self-pay

## 2023-07-23 MED ORDER — PROLIA 60 MG/ML ~~LOC~~ SOSY
60.0000 mg | PREFILLED_SYRINGE | Freq: Once | SUBCUTANEOUS | 0 refills | Status: AC
  Filled 2023-07-23: qty 1, 1d supply, fill #0
  Filled 2023-07-24: qty 1, 180d supply, fill #0

## 2023-07-23 NOTE — Progress Notes (Signed)
 Pharmacy Patient Advocate Encounter  Insurance verification completed.   The patient is insured through [employer] claim for Prolia . Co-pay is $300.  This test claim was processed through [facility]- copay amounts may vary at other pharmacies due to pharmacy/plan contracts, or as the patient moves through the different stages of their insurance plan.

## 2023-07-23 NOTE — Telephone Encounter (Signed)
 Called and spoke with patient to review pharmacy benefits. Pt is agreeable to having Prolia  rx sent to [facility]. Rx sent.

## 2023-07-23 NOTE — Addendum Note (Signed)
 Addended by: MARDY LEOTIS RAMAN on: 07/23/2023 10:16 AM   Modules accepted: Orders

## 2023-07-24 ENCOUNTER — Other Ambulatory Visit (HOSPITAL_COMMUNITY): Payer: Self-pay

## 2023-07-24 ENCOUNTER — Other Ambulatory Visit: Payer: Self-pay

## 2023-07-24 NOTE — Progress Notes (Signed)
 Specialty Pharmacy Initial Fill Coordination Note  Barbara Lawson is a 68 y.o. female contacted today regarding initial fill of specialty medication(s) Denosumab  (Prolia )   Patient requested Courier to Provider Office   Delivery date: 07/30/23   Verified address: [facility] [address]   Medication will be filled on 7/7.   Patient is aware of $300 copayment.

## 2023-07-29 ENCOUNTER — Other Ambulatory Visit: Payer: Self-pay

## 2023-07-29 ENCOUNTER — Other Ambulatory Visit (HOSPITAL_COMMUNITY): Payer: Self-pay

## 2023-07-30 NOTE — Telephone Encounter (Signed)
Received and scheduled.

## 2023-07-31 NOTE — Progress Notes (Unsigned)
 [facility] [address] [place] [address] Phone: [phone] Subjective:   LILLETTE Claretha Lawson am a scribe for Dr. Claudene.   I'm seeing this patient by the request  of:  Barbara Elsie Sayre, MD  CC: Right hip pain follow-up  [id number]  06/05/2023 Has fallen on the hip previously and will need to monitor.  Concerned that there is a possibility also of some arthritic arthritis that could be contributing and will get x-rays.     Injection given today and tolerated the procedure well.  Do not believe that the patient has some weakness noted.  This could be secondary to lumbar radiculopathy but I think it is more secondary to muscle imbalances.  Having catching in her hip that could be potential impingement.  Will get repeat x-rays from a year ago.  Depending on findings on the x-ray this could change medical management as well.  Did not give any medications.  Discussed icing regimen.  Follow-up again in 6 to 8 weeks.     Updated 08/01/2023 Barbara Lawson is a 68 y.o. female coming in with complaint of R hip pain. Patient states that the right hip isn't too bad today. Also here for her Prolia  shot.        Past Medical History:  Diagnosis Date   ANEMIA-NOS 09/28/2009   Aortic atherosclerosis (HCC)    ASTHMA 09/28/2009   moderate persistent    CAD (coronary artery disease)    Colon polyps    COPD (chronic obstructive pulmonary disease) (HCC)    COVID-19    01/20/2020   Emphysema lung (HCC)    vs bronchitis;paraseptal    Herpes    Herpes labialis 05/30/2023   HPV in female    hpv 20+ years    Hyperlipemia    HYPOTHYROIDISM 09/28/2009   Lung nodule    Osteoporosis    Pneumonia    hosp in 2018   Thyroid  nodule    right thyroid  5.2 x 1.8 x 1.9 cm right lobe   Past Surgical History:  Procedure Laterality Date   COLONOSCOPY     COLONOSCOPY WITH PROPOFOL  N/A 10/30/2021   Procedure: COLONOSCOPY WITH PROPOFOL ;  Surgeon: Therisa Bi,  MD;  Location: [facility] ENDOSCOPY;  Service: Gastroenterology;  Laterality: N/A;   ESOPHAGOGASTRODUODENOSCOPY N/A 10/30/2021   Procedure: ESOPHAGOGASTRODUODENOSCOPY (EGD);  Surgeon: Therisa Bi, MD;  Location: [facility] ENDOSCOPY;  Service: Gastroenterology;  Laterality: N/A;   WRIST SURGERY Right 05/03/2022   Social History   Socioeconomic History   Marital status: Divorced    Spouse name: Not on file   Number of children: Not on file   Years of education: Not on file   Highest education level: Bachelor's degree (e.g., BA, AB, BS)  Occupational History   Not on file  Tobacco Use   Smoking status: Former    Current packs/day: 0.00    Types: Cigarettes    Quit date: 01/26/2013    Years since quitting: 10.5   Smokeless tobacco: Never  Vaping Use   Vaping status: Former  Substance and Sexual Activity   Alcohol use: Yes    Alcohol/week: 3.0 standard drinks of alcohol    Types: 3 Standard drinks or equivalent per week   Drug use: No   Sexual activity: Not Currently  Other Topics Concern   Not on file  Social History Narrative   Lives alone with dog    Moved from [place]    Bachelors degree massage therapy  Age 51 y.o abortion    lmp age 4    Social Drivers of Corporate investment banker Strain: Low Risk  (03/20/2023)   Overall Financial Resource Strain (CARDIA)    Difficulty of Paying Living Expenses: Not hard at all  Food Insecurity: No Food Insecurity (03/20/2023)   Hunger Vital Sign    Worried About Running Out of Food in the Last Year: Never true    Ran Out of Food in the Last Year: Never true  Transportation Needs: No Transportation Needs (03/20/2023)   PRAPARE - Administrator, Civil Service (Medical): No    Lack of Transportation (Non-Medical): No  Physical Activity: Sufficiently Active (03/20/2023)   Exercise Vital Sign    Days of Exercise per Week: 3 days    Minutes of Exercise per Session: 60 min  Stress: No Stress Concern Present (03/20/2023)   [employer] of Occupational Health - Occupational Stress Questionnaire    Feeling of Stress : Only a little  Social Connections: Socially Isolated (03/20/2023)   Social Connection and Isolation Panel    Frequency of Communication with Friends and Family: Once a week    Frequency of Social Gatherings with Friends and Family: Once a week    Attends Religious Services: Never    Database administrator or Organizations: No    Attends Engineer, structural: Not on file    Marital Status: Divorced   Allergies  Allergen Reactions   Misc. Sulfonamide Containing Compounds Rash and Swelling    Patient Reports It Only Occurred with The Suppository Formulation, Patient Reports It Only Occurred with The Suppository Formulation, Patient Reports It Only Occurred with The Suppository Formulation   Sulfa Antibiotics Rash and Swelling    Patient Reports It Only Occurred with The Suppository Formulation  Patient Reports It Only Occurred with The Suppository Formulation Patient Reports It Only Occurred with The Suppository Formulation Patient Reports It Only Occurred with The Suppository Formulation  Patient Reports It Only Occurred with The Suppository Formulation  Patient Reports It Only Occurred with The Suppository Formulation Patient Reports It Only Occurred with The Suppository Formulation Patient Reports It Only Occurred with The Suppository Formulation    Patient Reports It Only Occurred with The Suppository Formulation Patient Reports It Only Occurred with The Suppository Formulation Patient Reports It Only Occurred with The Suppository Formulation    Patient Reports It Only Occurred with The Suppository Formulation   Cat Dander    Dust Mite Extract     Other Reaction(s): Other (See Comments)  Bothers asthma     Bothers asthma   Sulfonamide Derivatives    Family History  Problem Relation Age of Onset   Cancer Mother    Asthma Father    Cancer Father    Hyperlipidemia Father     Alzheimer's disease Father    Hyperlipidemia Brother     Current Outpatient Medications (Endocrine & Metabolic):    ARMOUR THYROID  30 MG tablet, Take 30 mg by mouth every morning.   ARMOUR THYROID  60 MG tablet, Take 60 mg by mouth every morning.   progesterone (PROMETRIUM) 200 MG capsule, Take 300 mg by mouth at bedtime.  Current Outpatient Medications (Cardiovascular):    atorvastatin  (LIPITOR) 10 MG tablet, Take 1 tablet (10 mg total) by mouth daily.   colestipol  (COLESTID ) 1 g tablet, Take 2 tablets twice daily with meals   hydrochlorothiazide  (HYDRODIURIL ) 25 MG tablet, Take 0.5 tablets (12.5 mg total) by mouth daily.  Current  Outpatient Medications (Respiratory):    Fluticasone -Umeclidin-Vilant (TRELEGY ELLIPTA) 100-62.5-25 MCG/ACT AEPB, Inhale into the lungs.   Fluticasone -Umeclidin-Vilant (TRELEGY ELLIPTA) 200-62.5-25 MCG/ACT AEPB, Inhale into the lungs.   ipratropium (ATROVENT) 0.02 % nebulizer solution, Take 500 mcg by nebulization 4 (four) times daily.   Ipratropium-Albuterol (COMBIVENT RESPIMAT) 20-100 MCG/ACT AERS respimat, Inhale 1 puff into the lungs every 6 (six) hours as needed for wheezing.   theophylline  (THEODUR) 300 MG 12 hr tablet, Take 1.5 tablets by mouth 2 (two) times daily.    Current Outpatient Medications (Other):    cholecalciferol (VITAMIN D ) 25 MCG (1000 UNIT) tablet, Take 2,000 Units by mouth daily.   clonazePAM (KLONOPIN) 0.5 MG tablet, Take 0.25 mg by mouth at bedtime. Outside prescriber holistic MD Dr. Marsa Augustides   gabapentin (NEURONTIN) 100 MG capsule, Take by mouth.   ondansetron  (ZOFRAN ) 4 MG tablet,    valACYclovir  (VALTREX ) 1000 MG tablet, 2 pills x 1 dose then X bid 3-7 days with outbreak until resolution   varenicline  (CHANTIX ) 0.5 MG tablet, PLEASE SEE ATTACHED FOR DETAILED DIRECTIONS   Reviewed prior external information including notes and imaging from  primary care provider As well as notes that were available from care  everywhere and other healthcare systems.  Past medical history, social, surgical and family history all reviewed in electronic medical record.  No pertanent information unless stated regarding to the chief complaint.   Review of Systems:  No headache, visual changes, nausea, vomiting, diarrhea, constipation, dizziness, abdominal pain, skin rash, fevers, chills, night sweats, weight loss, swollen lymph nodes, body aches, joint swelling, chest pain, shortness of breath, mood changes. POSITIVE muscle aches  Objective  Blood pressure 122/70, pulse 87, height 5' 5 (1.651 m), weight 120 lb 9.6 oz (54.7 kg), SpO2 97%.   General: No apparent distress alert and oriented x3 mood and affect normal, dressed appropriately.  HEENT: Pupils equal, extraocular movements intact  Respiratory: Patient's speak in full sentences and does not appear short of breath  Cardiovascular: No lower extremity edema, non tender, no erythema  Right hip exam still shows some tenderness to palpation over the greater trochanteric area and minorly over the sacroiliac joint.  Some positive impingement noted but still good range of motion noted. Back exam does have some mild loss of lordosis.  Some mild limited range of motion in extension.  Some tenderness seems to be more in the L4-L5 area.   Impression and Recommendations:    The above documentation has been reviewed and is accurate and complete Wilferd Ritson M Dhiren Azimi, DO

## 2023-08-01 ENCOUNTER — Ambulatory Visit: Admitting: Family Medicine

## 2023-08-01 VITALS — BP 122/70 | HR 87 | Ht 65.0 in | Wt 120.6 lb

## 2023-08-01 DIAGNOSIS — M25551 Pain in right hip: Secondary | ICD-10-CM

## 2023-08-01 DIAGNOSIS — M419 Scoliosis, unspecified: Secondary | ICD-10-CM | POA: Diagnosis not present

## 2023-08-01 DIAGNOSIS — M810 Age-related osteoporosis without current pathological fracture: Secondary | ICD-10-CM | POA: Diagnosis not present

## 2023-08-01 MED ORDER — DENOSUMAB 60 MG/ML ~~LOC~~ SOSY: 60.0000 mg | PREFILLED_SYRINGE | Freq: Once | SUBCUTANEOUS | Status: DC

## 2023-08-01 MED ORDER — DENOSUMAB 60 MG/ML ~~LOC~~ SOSY
60.0000 mg | PREFILLED_SYRINGE | Freq: Once | SUBCUTANEOUS | Status: AC
  Administered 2023-08-01: 60 mg via SUBCUTANEOUS

## 2023-08-01 NOTE — Assessment & Plan Note (Signed)
 Patient given Prolia , will need to likely continue at this time.  Started by another provider and will follow-up with them for these injections.  Medication was given by pharmacy.

## 2023-08-01 NOTE — Assessment & Plan Note (Signed)
 Pain in the right hip is multifactorial.

## 2023-08-01 NOTE — Patient Instructions (Addendum)
 Prolia  in 6 months Do prescribed exercises at least 3x a week See you again in 3 months

## 2023-08-09 ENCOUNTER — Other Ambulatory Visit: Payer: Self-pay

## 2023-08-12 DIAGNOSIS — Z87891 Personal history of nicotine dependence: Secondary | ICD-10-CM | POA: Diagnosis not present

## 2023-08-12 DIAGNOSIS — J4540 Moderate persistent asthma, uncomplicated: Secondary | ICD-10-CM | POA: Diagnosis not present

## 2023-08-12 NOTE — Telephone Encounter (Signed)
 Last Prolia  inj 08/01/23 Next Prolia  inj due 02/02/24

## 2023-09-11 IMAGING — DX DG LUMBAR SPINE COMPLETE 4+V
5 series · 5 of 5 positions shown · non-contrast
Comparison: None.

CLINICAL DATA: Chronic low back pain.  History of scoliosis.

EXAM:
LUMBAR SPINE - COMPLETE 4+ VIEW

[lumbar spine ap]
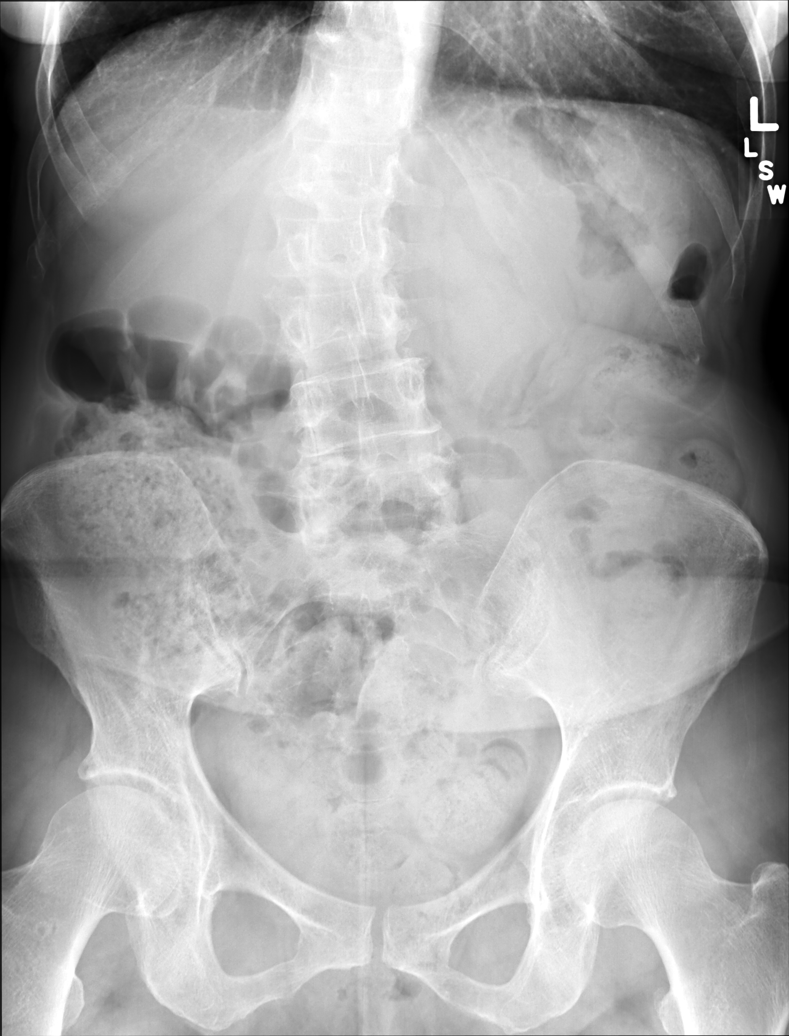

[lumbar spine obl (oblique) (1 of 2)]
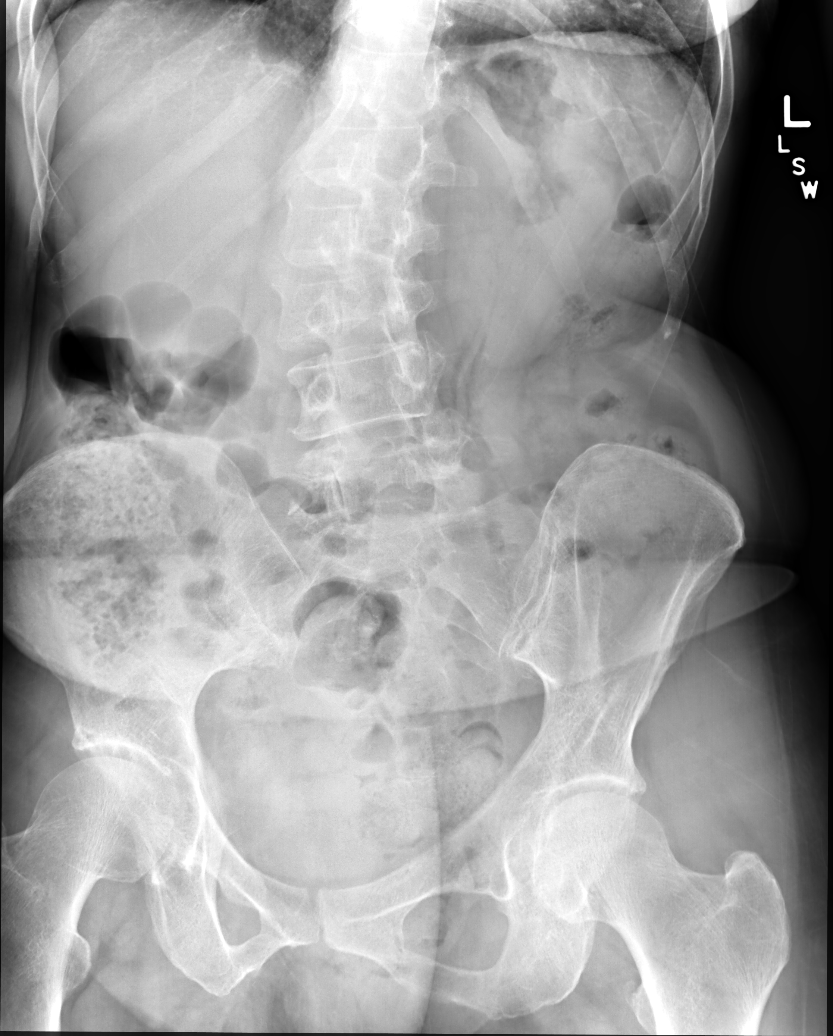

[lumbar spine obl (oblique) (2 of 2)]
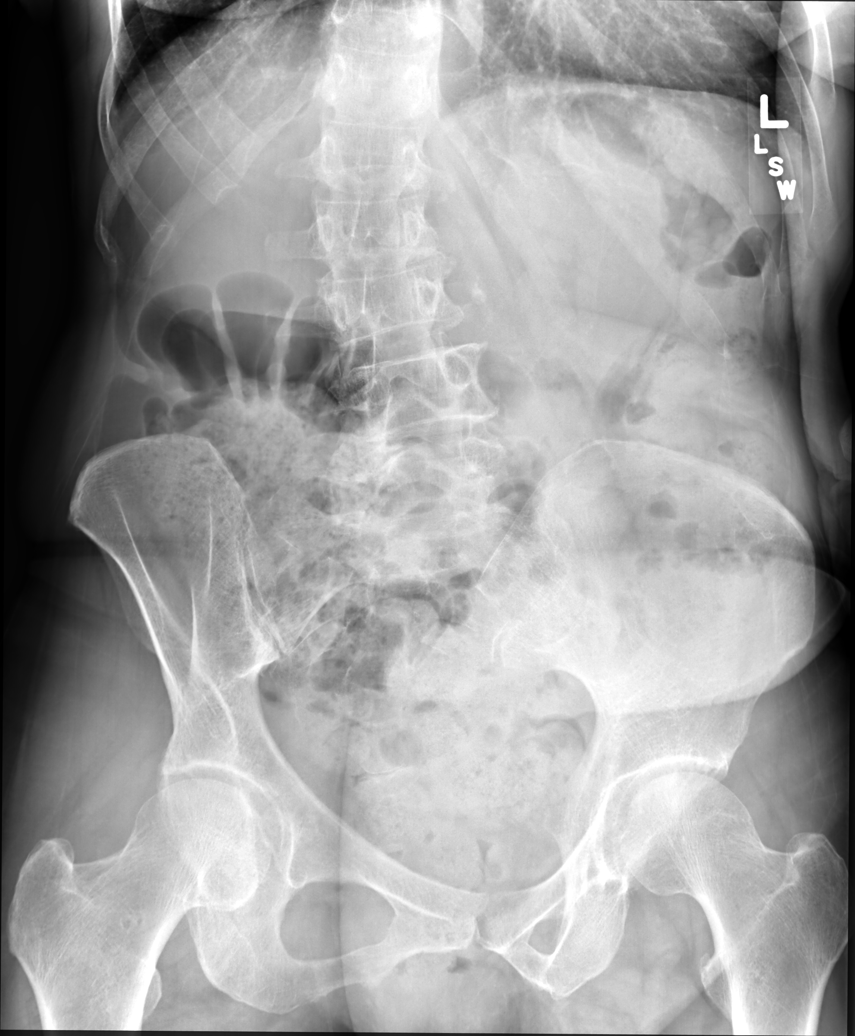

[lumbar spine lat]
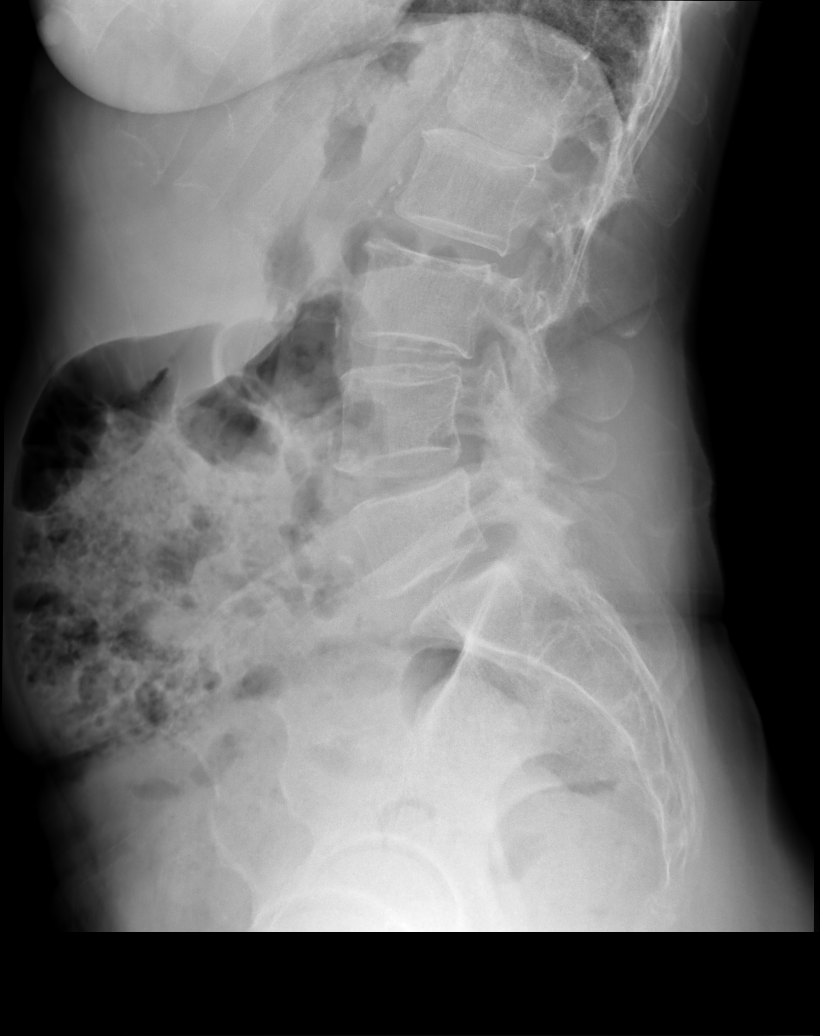

[lumbar spot lat]
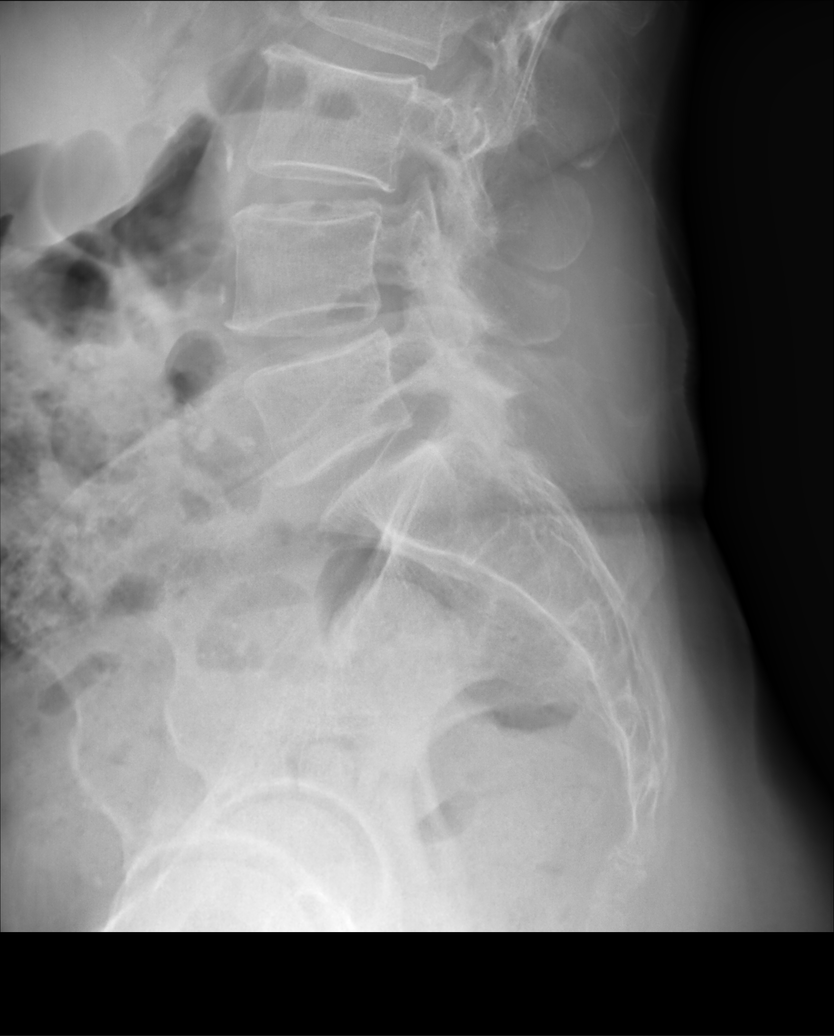

[5 of 5 positions shown; findings below may reference images not displayed]

FINDINGS: There are 5 lumbar type vertebra. Moderate dextroscoliotic curvature
of the lumbar spine centered at L2-L3. Trace anterolisthesis of L5
on S1. Mild multilevel endplate spurring with relative preservation
of disc spaces. Lower lumbar facet hypertrophy. Normal vertebral
body heights. No evidence of fracture or focal bone abnormality. The
sacroiliac joints are congruent.
IMPRESSION: Moderate dextroscoliotic curvature of the lumbar spine with mild
multilevel spondylosis. Trace anterolisthesis of L5 on S1 likely due
to facet hypertrophy.

## 2023-09-11 IMAGING — DX DG CERVICAL SPINE COMPLETE 4+V
6 series · 6 of 6 positions shown · non-contrast
Comparison: None.

CLINICAL DATA: Chronic neck pain.  History of scoliosis.

EXAM:
CERVICAL SPINE - COMPLETE 4+ VIEW

[cervical spine ap]
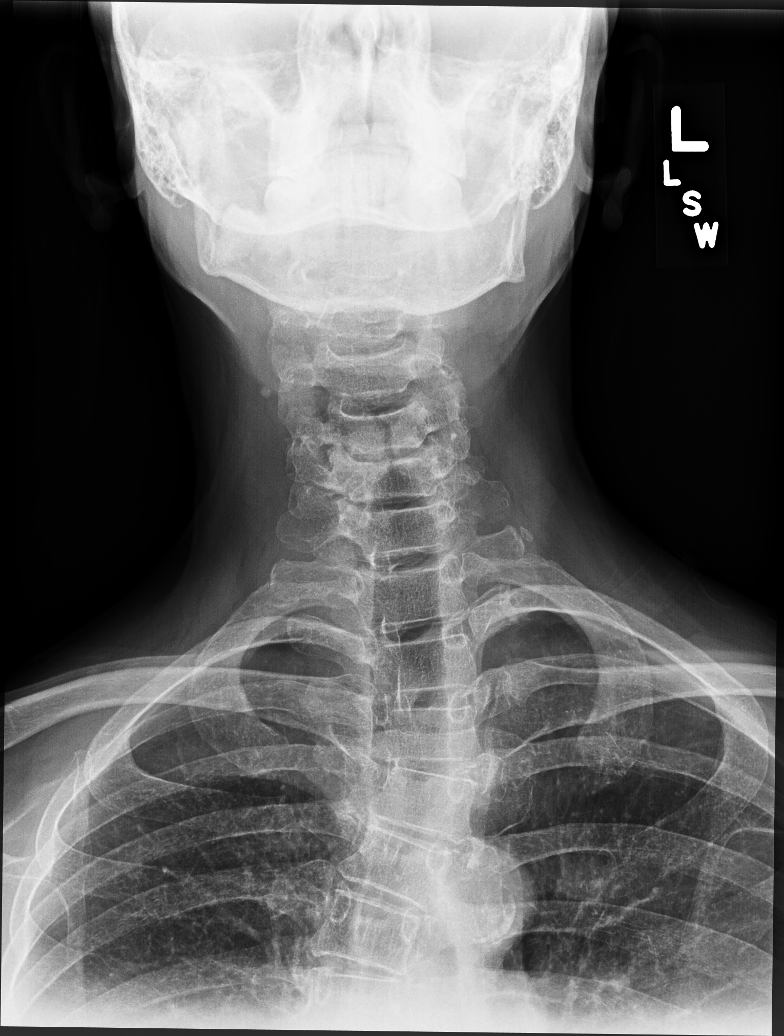

[cervical spine oblique (1 of 2)]
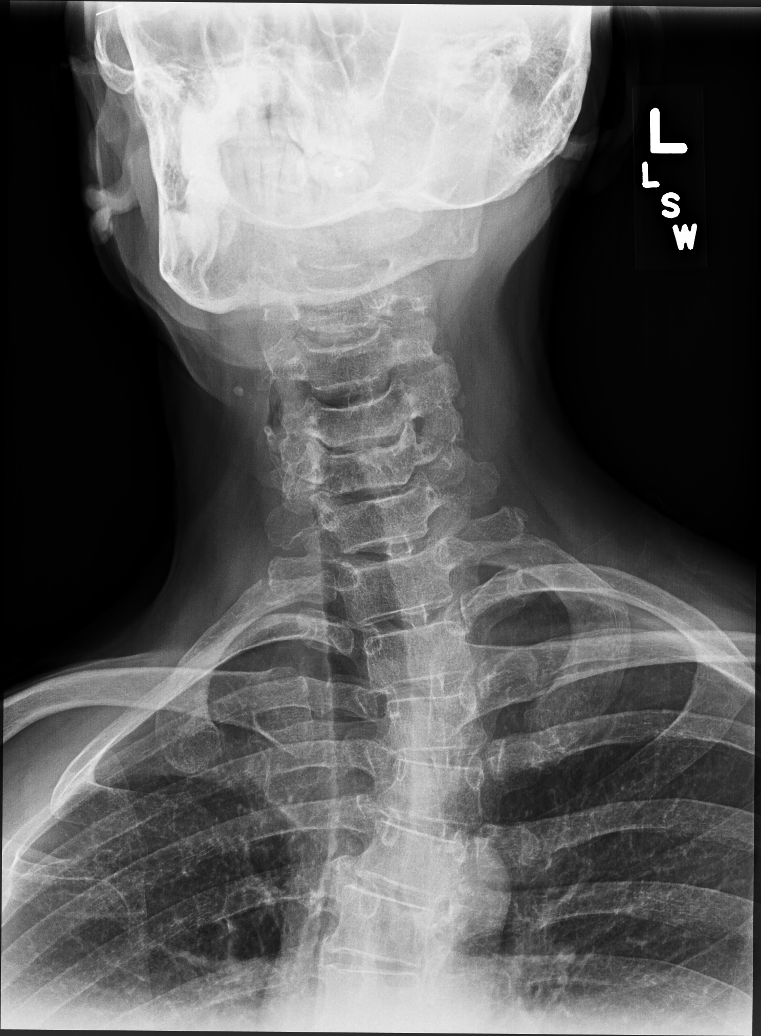

[cervical spine oblique (2 of 2)]
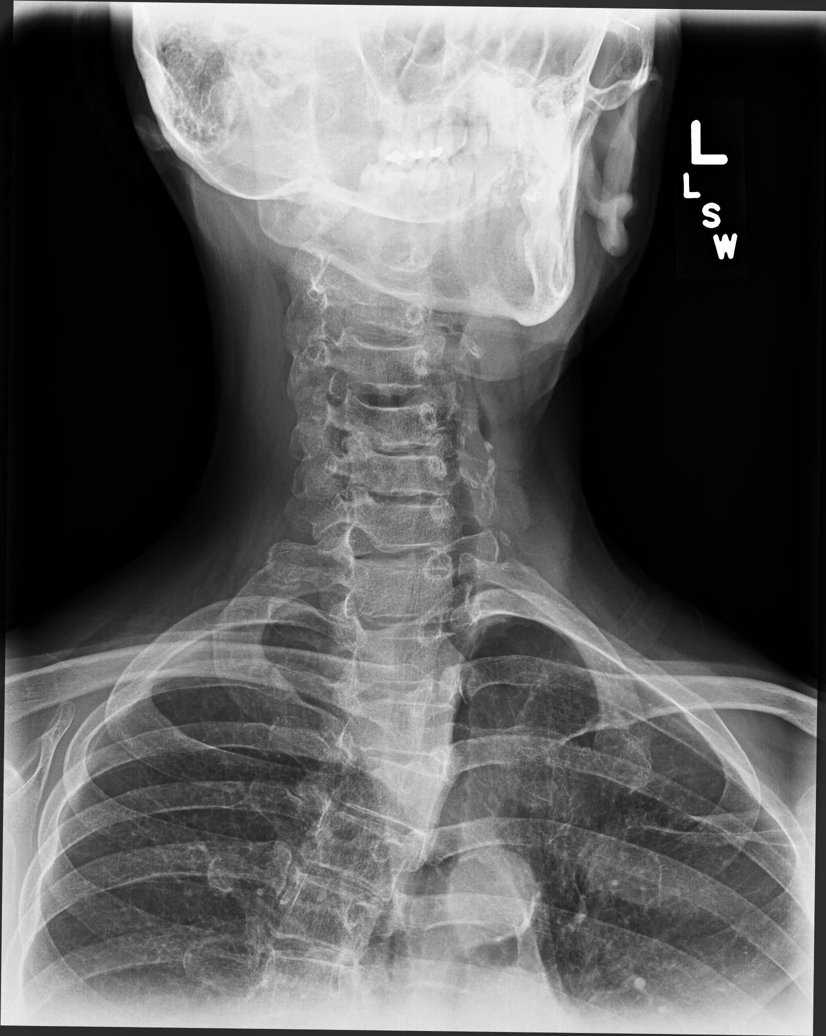

[cervical spine lat]
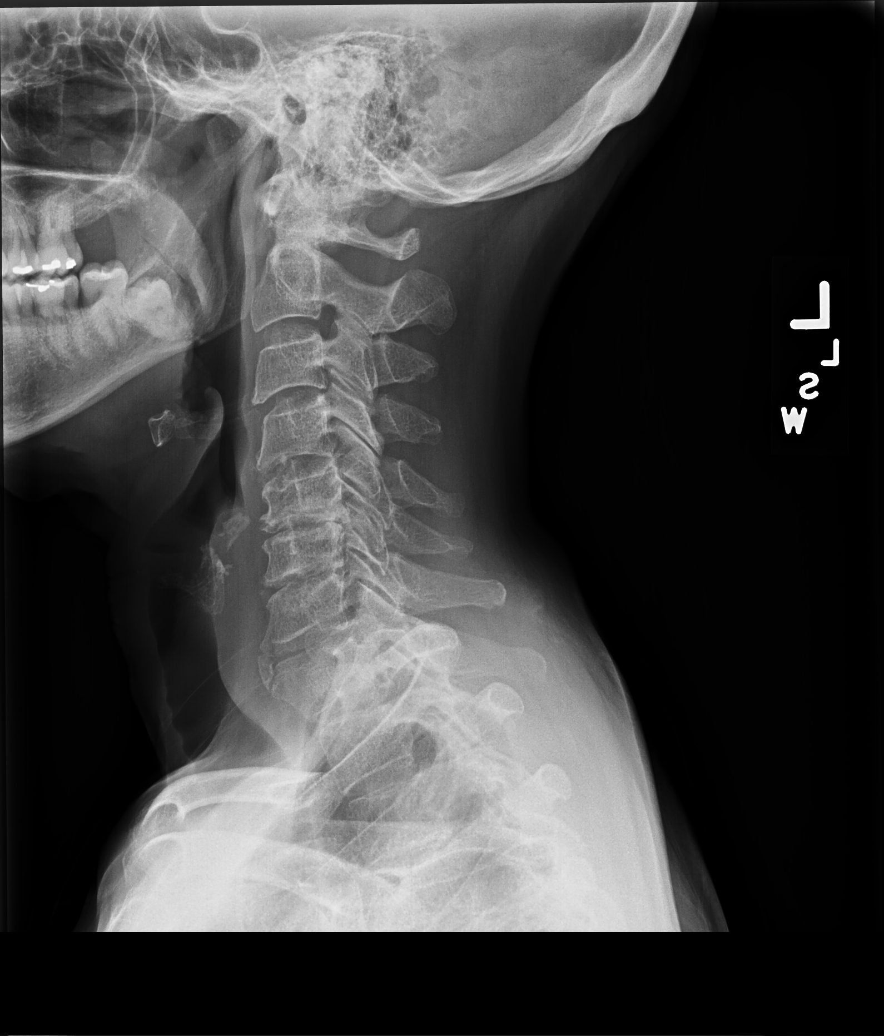

[swimmers lat]
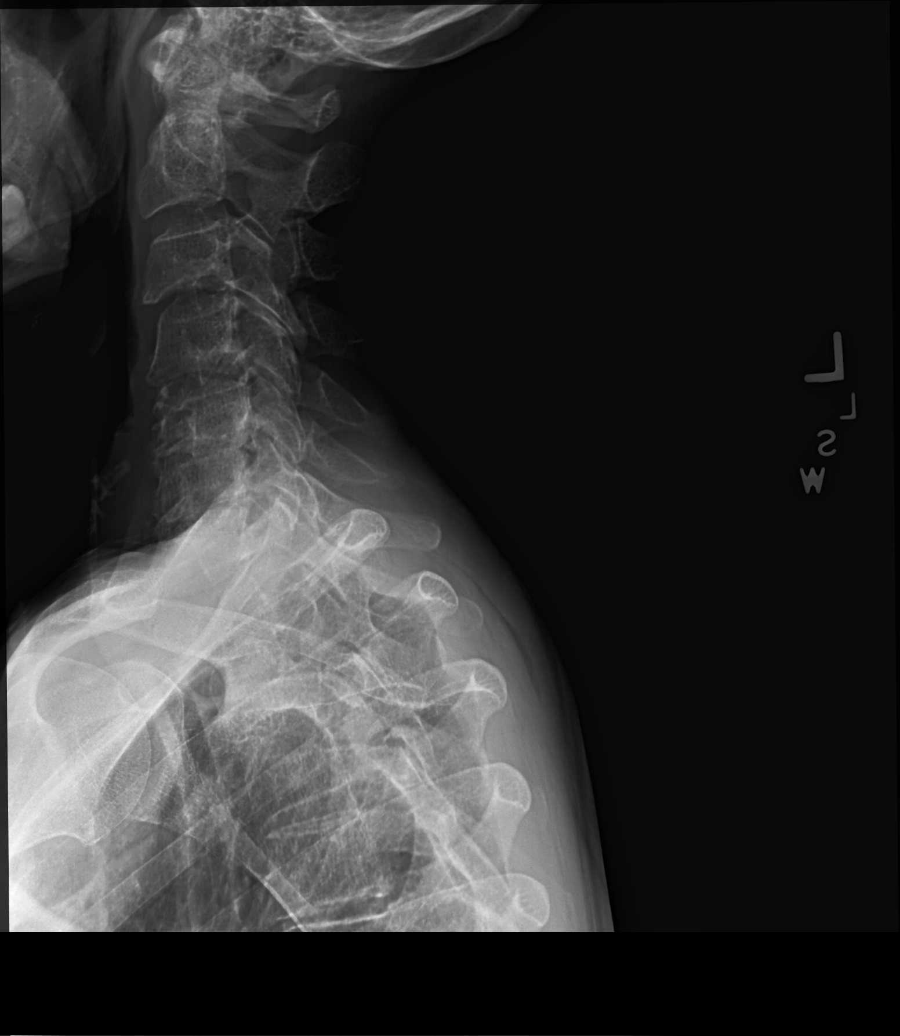

[cervical spine open mouth ap]
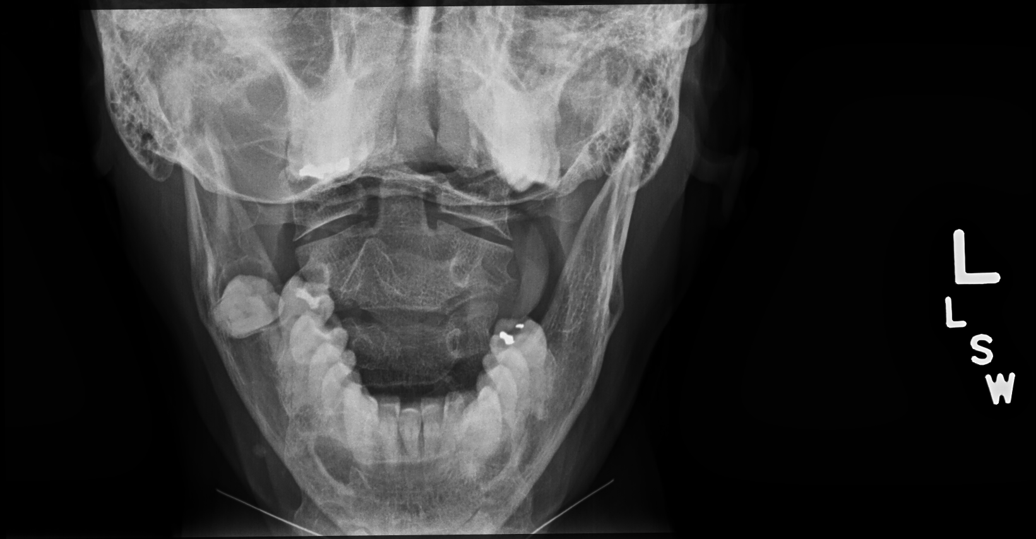

[6 of 6 positions shown; findings below may reference images not displayed]

FINDINGS: Moderate scoliotic curvature. Slight straightening of normal
lordosis. Trace anterolisthesis of C4 on C5 and retrolisthesis of C5
on C6. Degenerative disc disease at C5-C6 and C6-C7. Additional
endplate spurring at multiple levels. There is moderate multilevel
facet hypertrophy. Limited assessment of bony neural foramina due to
positioning. No evidence of fracture, focal bone lesion or bone
destruction. No prevertebral soft tissue thickening.
IMPRESSION: 1. Moderate scoliotic curvature.
2. Degenerative disc disease at C5-C6 and C6-C7 with moderate
multilevel facet hypertrophy.

## 2023-09-11 IMAGING — DX DG THORACIC SPINE 2V
3 series · 3 of 3 positions shown · non-contrast
Comparison: None.

CLINICAL DATA: Mid back pain, chronic.  History of scoliosis.

EXAM:
THORACIC SPINE 2 VIEWS

[thoracic spine ap]
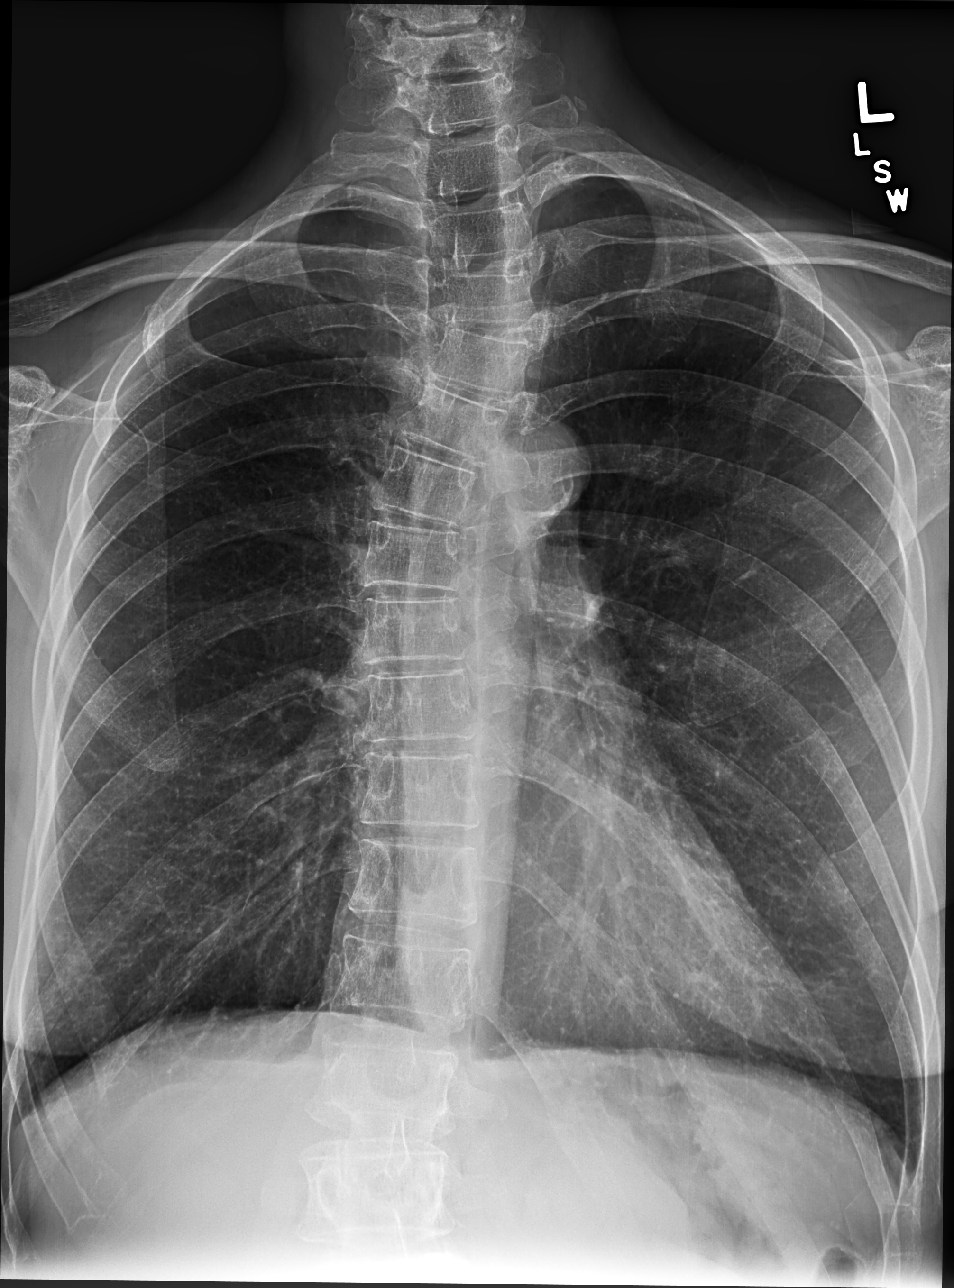

[thoracic spine lat]
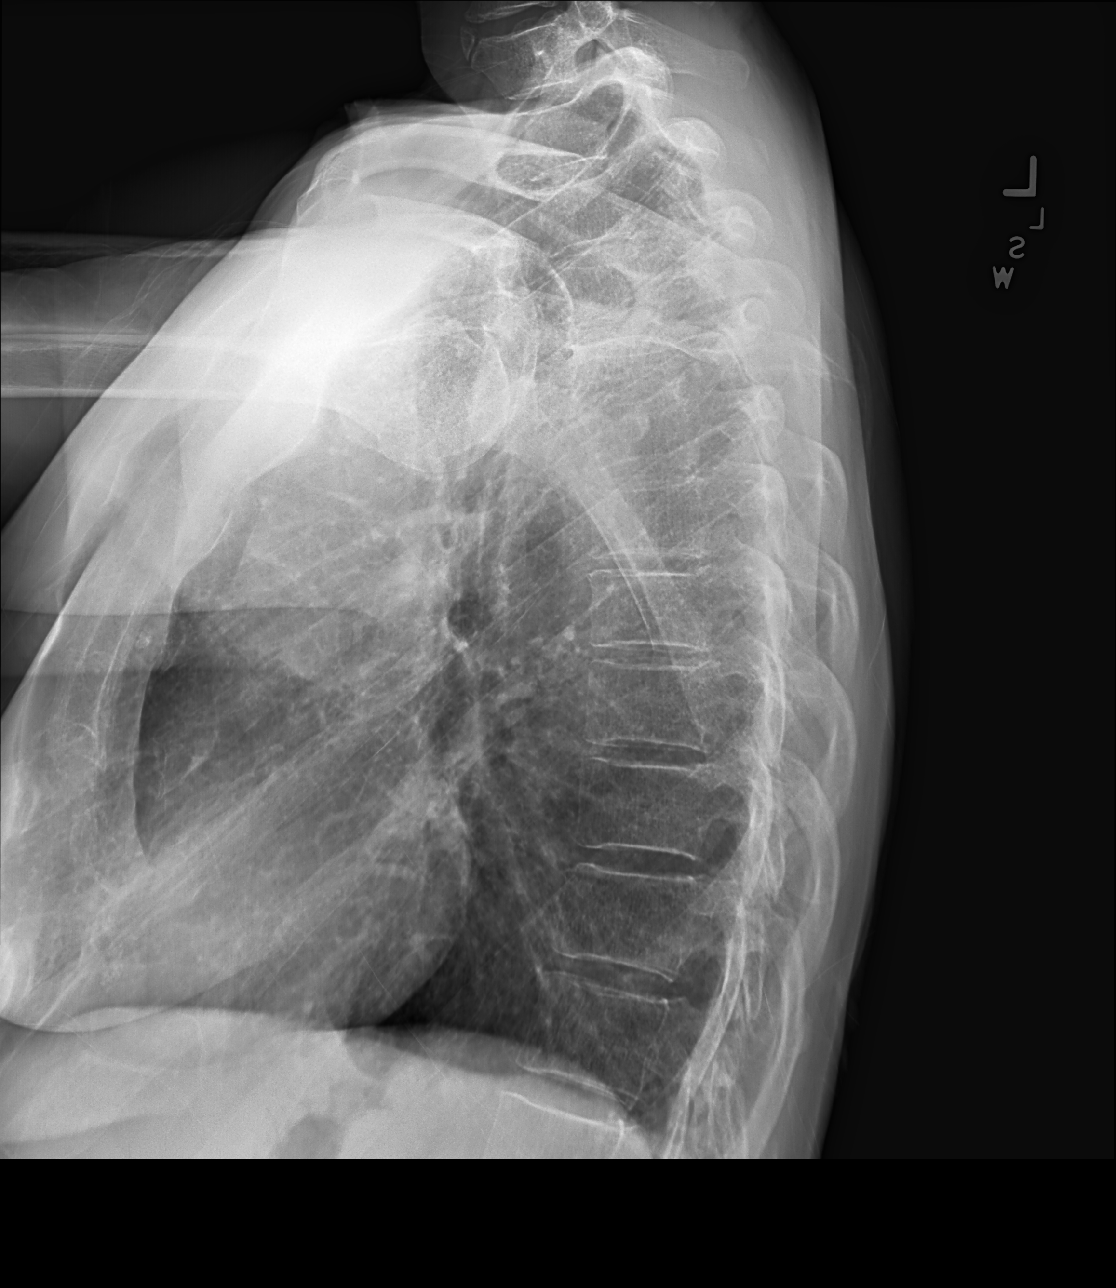

[swimmers lat]
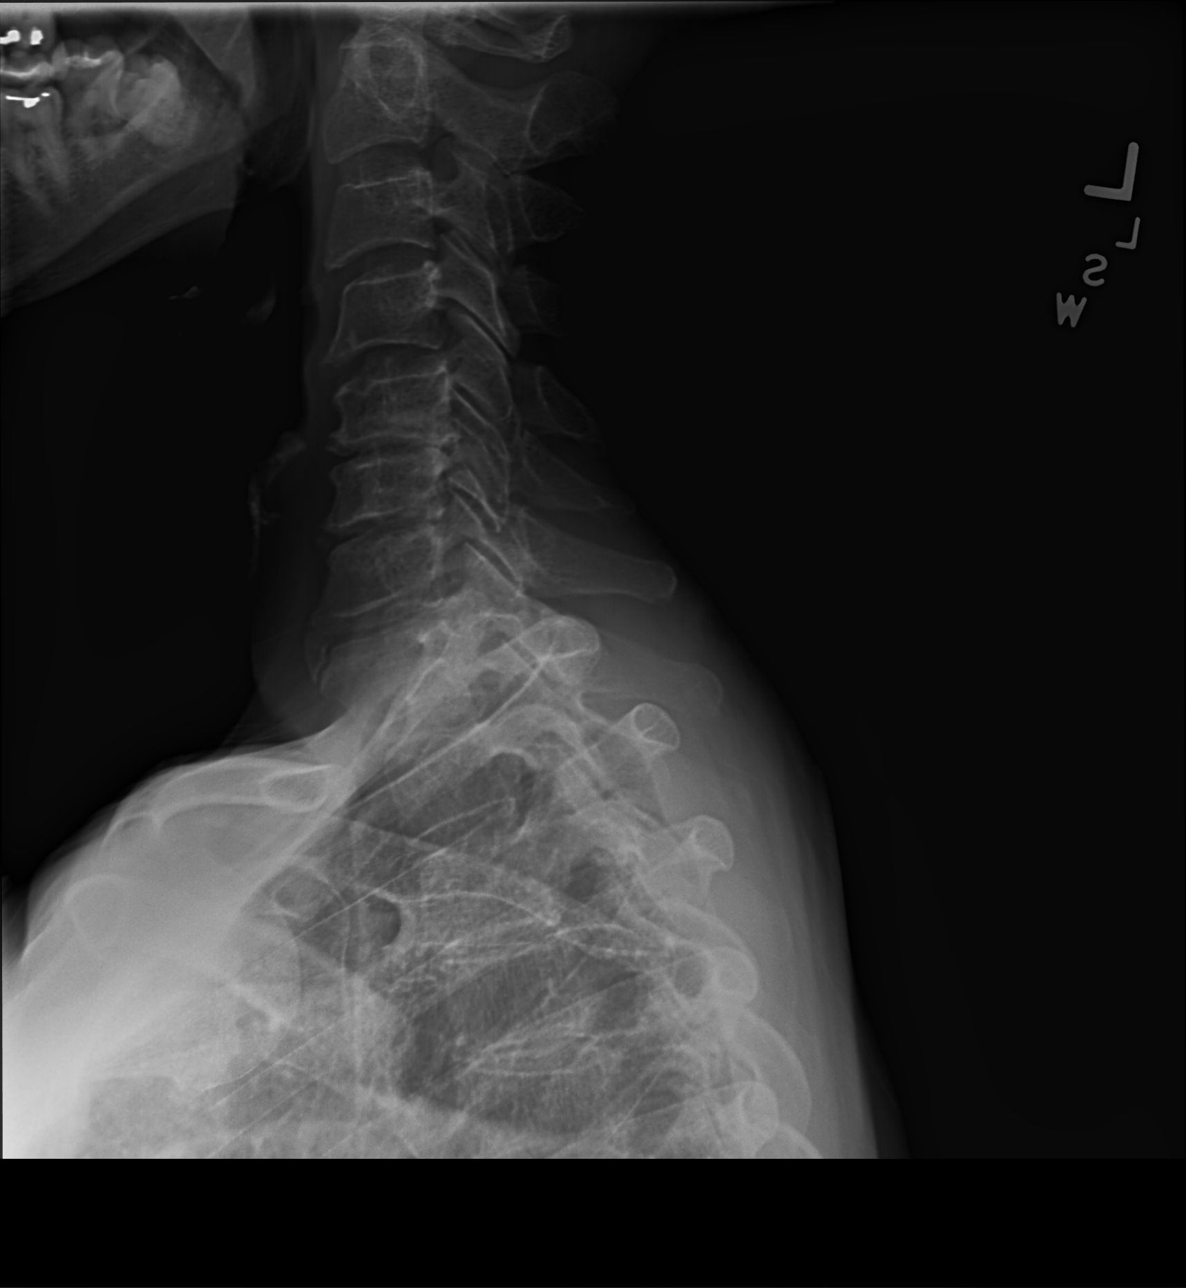

[3 of 3 positions shown; findings below may reference images not displayed]

FINDINGS: Moderate S shaped scoliotic curvature of the thoracic spine, with
levo scoliotic curvature centered at T3-T4. Dextroscoliotic
curvature of the lower thoracic spine is broad-based. Normal
thoracic kyphosis. Normal vertebral body heights. Mild disc space
narrowing at the scoliotic levels. No evidence of fracture or focal
bone abnormality. No paravertebral soft tissue abnormalities.
IMPRESSION: 1. Moderate S shaped scoliotic curvature of the thoracic spine.
2. Mild degenerative disc disease at the scoliotic levels.

## 2023-09-20 ENCOUNTER — Encounter

## 2023-09-26 DIAGNOSIS — R5381 Other malaise: Secondary | ICD-10-CM | POA: Diagnosis not present

## 2023-09-26 DIAGNOSIS — R7989 Other specified abnormal findings of blood chemistry: Secondary | ICD-10-CM | POA: Diagnosis not present

## 2023-09-26 DIAGNOSIS — E039 Hypothyroidism, unspecified: Secondary | ICD-10-CM | POA: Diagnosis not present

## 2023-09-26 DIAGNOSIS — E063 Autoimmune thyroiditis: Secondary | ICD-10-CM | POA: Diagnosis not present

## 2023-09-30 DIAGNOSIS — Z23 Encounter for immunization: Secondary | ICD-10-CM | POA: Diagnosis not present

## 2023-10-17 DIAGNOSIS — N390 Urinary tract infection, site not specified: Secondary | ICD-10-CM | POA: Diagnosis not present

## 2023-10-25 ENCOUNTER — Ambulatory Visit

## 2023-10-25 VITALS — Ht 64.5 in | Wt 124.0 lb

## 2023-10-25 DIAGNOSIS — Z0000 Encounter for general adult medical examination without abnormal findings: Secondary | ICD-10-CM | POA: Diagnosis not present

## 2023-10-25 NOTE — Progress Notes (Signed)
 Subjective:   Barbara Lawson is a 68 y.o. who presents for a Medicare Wellness preventive visit.  As a reminder, Annual Wellness Visits don't include a physical exam, and some assessments may be limited, especially if this visit is performed virtually. We may recommend an in-person follow-up visit with your provider if needed.  Visit Complete: Virtual I connected with  Barbara Lawson on 10/25/23 by a audio enabled telemedicine application and verified that I am speaking with the correct person using two identifiers.  Patient Location: Home  Provider Location: Office/Clinic  I discussed the limitations of evaluation and management by telemedicine. The patient expressed understanding and agreed to proceed.  Vital Signs: Because this visit was a virtual/telehealth visit, some criteria may be missing or patient reported. Any vitals not documented were not able to be obtained and vitals that have been documented are patient reported.  VideoError- Librarian, academic were attempted between this provider and patient, however failed, due to patient having technical difficulties OR patient did not have access to video capability.  We continued and completed visit with audio only.   Persons Participating in Visit: Patient.  AWV Questionnaire: Yes: Patient Medicare AWV questionnaire was completed by the patient on 10/21/2023; I have confirmed that all information answered by patient is correct and no changes since this date.  Cardiac Risk Factors include: advanced age (>27men, >96 women);dyslipidemia;hypertension     Objective:    Today's Vitals   10/25/23 1125 10/25/23 1126  Weight: 124 lb (56.2 kg)   Height: 5' 4.5 (1.638 m)   PainSc:  3    Body mass index is 20.96 kg/m.     10/25/2023   11:37 AM 10/01/2022    9:20 AM 10/30/2021    7:06 AM 08/11/2021   11:03 AM  Advanced Directives  Does Patient Have a Medical Advance Directive? No No No No  Would patient  like information on creating a medical advance directive?  Yes (MAU/Ambulatory/Procedural Areas - Information given)  No - Patient declined    Current Medications (verified) Outpatient Encounter Medications as of 10/25/2023  Medication Sig   ARMOUR THYROID  30 MG tablet Take 30 mg by mouth every morning.   ARMOUR THYROID  60 MG tablet Take 60 mg by mouth every morning.   atorvastatin  (LIPITOR) 10 MG tablet Take 1 tablet (10 mg total) by mouth daily.   cholecalciferol (VITAMIN D ) 25 MCG (1000 UNIT) tablet Take 2,000 Units by mouth daily.   colestipol  (COLESTID ) 1 g tablet Take 2 tablets twice daily with meals   Fluticasone -Umeclidin-Vilant (TRELEGY ELLIPTA) 100-62.5-25 MCG/ACT AEPB Inhale into the lungs.   ipratropium (ATROVENT) 0.02 % nebulizer solution Take 500 mcg by nebulization 4 (four) times daily.   Ipratropium-Albuterol (COMBIVENT RESPIMAT) 20-100 MCG/ACT AERS respimat Inhale 1 puff into the lungs every 6 (six) hours as needed for wheezing.   progesterone (PROMETRIUM) 200 MG capsule Take 300 mg by mouth at bedtime.   theophylline  (THEODUR) 300 MG 12 hr tablet Take 1.5 tablets by mouth 2 (two) times daily.   valACYclovir  (VALTREX ) 1000 MG tablet 2 pills x 1 dose then X bid 3-7 days with outbreak until resolution   clonazePAM (KLONOPIN) 0.5 MG tablet Take 0.25 mg by mouth at bedtime. Outside prescriber holistic MD Dr. Marsa Lawson (Patient not taking: Reported on 10/25/2023)   Fluticasone -Umeclidin-Vilant (TRELEGY ELLIPTA) 200-62.5-25 MCG/ACT AEPB Inhale into the lungs.   gabapentin (NEURONTIN) 100 MG capsule Take by mouth. (Patient not taking: Reported on 10/25/2023)   hydrochlorothiazide  (HYDRODIURIL )  25 MG tablet Take 0.5 tablets (12.5 mg total) by mouth daily. (Patient not taking: Reported on 10/25/2023)   ondansetron  (ZOFRAN ) 4 MG tablet  (Patient not taking: Reported on 10/25/2023)   varenicline  (CHANTIX ) 0.5 MG tablet PLEASE SEE ATTACHED FOR DETAILED DIRECTIONS (Patient not taking:  Reported on 10/25/2023)   No facility-administered encounter medications on file as of 10/25/2023.    Allergies (verified) Misc. sulfonamide containing compounds, Sulfa antibiotics, Cat dander, Dust mite extract, and Sulfonamide derivatives   History: Past Medical History:  Diagnosis Date   ANEMIA-NOS 09/28/2009   Aortic atherosclerosis    ASTHMA 09/28/2009   moderate persistent    CAD (coronary artery disease)    Colon polyps    COPD (chronic obstructive pulmonary disease) (HCC)    COVID-19    01/20/2020   Emphysema lung (HCC)    vs bronchitis;paraseptal    Herpes    Herpes labialis 05/30/2023   HPV in female    hpv 20+ years    Hyperlipemia    HYPOTHYROIDISM 09/28/2009   Lung nodule    Osteoporosis    Pneumonia    hosp in 2018   Thyroid  nodule    right thyroid  5.2 x 1.8 x 1.9 cm right lobe   Past Surgical History:  Procedure Laterality Date   COLONOSCOPY     COLONOSCOPY WITH PROPOFOL  N/A 10/30/2021   Procedure: COLONOSCOPY WITH PROPOFOL ;  Surgeon: Barbara Bi, MD;  Location: [facility] ENDOSCOPY;  Service: Gastroenterology;  Laterality: N/A;   ESOPHAGOGASTRODUODENOSCOPY N/A 10/30/2021   Procedure: ESOPHAGOGASTRODUODENOSCOPY (EGD);  Surgeon: Barbara Bi, MD;  Location: [facility] ENDOSCOPY;  Service: Gastroenterology;  Laterality: N/A;   WRIST SURGERY Right 05/03/2022   Family History  Problem Relation Age of Onset   Cancer Mother    Asthma Father    Cancer Father    Hyperlipidemia Father    Alzheimer's disease Father    Hyperlipidemia Brother    Social History   Socioeconomic History   Marital status: Divorced    Spouse name: Not on file   Number of children: Not on file   Years of education: Not on file   Highest education level: Bachelor's degree (e.g., BA, AB, BS)  Occupational History   Not on file  Tobacco Use   Smoking status: Former    Current packs/day: 0.00    Types: Cigarettes    Quit date: 01/26/2013    Years since quitting: 10.7   Smokeless tobacco:  Never  Vaping Use   Vaping status: Former  Substance and Sexual Activity   Alcohol use: Yes    Alcohol/week: 3.0 standard drinks of alcohol    Types: 3 Standard drinks or equivalent per week   Drug use: No   Sexual activity: Not Currently  Other Topics Concern   Not on file  Social History Narrative   Lives alone with dog    Moved from [place]    Bachelors degree massage therapy    Age 69 y.o abortion    lmp age 31    Social Drivers of Health   Financial Resource Strain: Low Risk  (10/21/2023)   Overall Financial Resource Strain (CARDIA)    Difficulty of Paying Living Expenses: Not hard at all  Food Insecurity: No Food Insecurity (10/21/2023)   Hunger Vital Sign    Worried About Running Out of Food in the Last Year: Never true    Ran Out of Food in the Last Year: Never true  Transportation Needs: No Transportation Needs (10/21/2023)   PRAPARE -  Administrator, Civil Service (Medical): No    Lack of Transportation (Non-Medical): No  Physical Activity: Sufficiently Active (10/21/2023)   Exercise Vital Sign    Days of Exercise per Week: 3 days    Minutes of Exercise per Session: 50 min  Recent Concern: Physical Activity - Insufficiently Active (09/17/2023)   Exercise Vital Sign    Days of Exercise per Week: 2 days    Minutes of Exercise per Session: 20 min  Stress: No Stress Concern Present (10/21/2023)   [employer] of Occupational Health - Occupational Stress Questionnaire    Feeling of Stress: Only a little  Social Connections: Socially Isolated (10/21/2023)   Social Connection and Isolation Panel    Frequency of Communication with Friends and Family: Twice a week    Frequency of Social Gatherings with Friends and Family: Once a week    Attends Religious Services: Never    Database administrator or Organizations: No    Attends Engineer, structural: Not on file    Marital Status: Divorced    Tobacco Counseling Counseling given: Not  Answered    Clinical Intake:  Pre-visit preparation completed: Yes  Pain : 0-10 Pain Score: 3  Pain Type: Acute pain Pain Location: Head Pain Descriptors / Indicators: Aching Pain Onset: 1 to 4 weeks ago Pain Frequency: Constant     Nutritional Status: BMI of 19-24  Normal Nutritional Risks: Nausea/ vomitting/ diarrhea (on and off diarrhea) Diabetes: No  Lab Results  Component Value Date   HGBA1C 5.9 10/01/2022     How often do you need to have someone help you when you read instructions, pamphlets, or other written materials from your doctor or pharmacy?: 1 - Never  Interpreter Needed?: No  Information entered by :: NAllen LPN   Activities of Daily Living     10/21/2023   12:28 PM 09/17/2023    9:58 AM  In your present state of health, do you have any difficulty performing the following activities:  Hearing? 0 0  Vision? 0 0  Difficulty concentrating or making decisions? 0 0  Walking or climbing stairs? 0 0  Dressing or bathing? 0 0  Doing errands, shopping? 0 0  Preparing Food and eating ? N N  Using the Toilet? N N  In the past six months, have you accidently leaked urine? N N  Do you have problems with loss of bowel control? N N  Managing your Medications? N N  Managing your Finances? N N  Housekeeping or managing your Housekeeping? N N    Patient Care Team: Berneta Elsie Sayre, MD as PCP - General (Family Medicine)  I have updated your Care Teams any recent Medical Services you may have received from other providers in the past year.     Assessment:   This is a routine wellness examination for [facility].  Hearing/Vision screen Hearing Screening - Comments:: Denies hearing issues Vision Screening - Comments:: Regular eye exams, [facility]   Goals Addressed             This Visit's Progress    Patient Stated       10/25/2023, would like more cardio        Depression Screen     10/25/2023   11:40 AM 11/05/2022    2:09 PM  10/01/2022   11:07 AM 10/01/2022    9:25 AM 03/29/2022    9:32 AM 09/29/2021    1:38 PM 09/29/2021    1:13  PM  PHQ 2/9 Scores  PHQ - 2 Score 0 0 0 0 0 0 0  PHQ- 9 Score  0 5 6  2      Fall Risk     10/21/2023   12:28 PM 09/17/2023    9:58 AM 11/05/2022    2:09 PM 10/01/2022   11:07 AM 10/01/2022    9:23 AM  Fall Risk   Falls in the past year? 0 0 0 1 1  Comment     fell down stairs  Number falls in past yr: 0 0 0 0 1  Injury with Fall? 0 0 0 1 1  Comment     broke wrist  Risk for fall due to : Medication side effect  No Fall Risks History of fall(s) History of fall(s);Medication side effect  Follow up Falls prevention discussed;Falls evaluation completed  Falls evaluation completed Falls evaluation completed Falls prevention discussed;Falls evaluation completed    MEDICARE RISK AT HOME:  Medicare Risk at Home Any stairs in or around the home?: (Patient-Rptd) Yes If so, are there any without handrails?: (Patient-Rptd) No Home free of loose throw rugs in walkways, pet beds, electrical cords, etc?: (Patient-Rptd) Yes Adequate lighting in your home to reduce risk of falls?: (Patient-Rptd) Yes Life alert?: (Patient-Rptd) No Use of a cane, walker or w/c?: (Patient-Rptd) No Grab bars in the bathroom?: (Patient-Rptd) Yes Shower chair or bench in shower?: (Patient-Rptd) Yes Elevated toilet seat or a handicapped toilet?: (Patient-Rptd) No  TIMED UP AND GO:  Was the test performed?  No  Cognitive Function: 6CIT completed        10/25/2023   11:40 AM 10/01/2022    9:26 AM  6CIT Screen  What Year? 0 points 0 points  What month? 0 points 0 points  What time? 0 points 0 points  Count back from 20 0 points 0 points  Months in reverse 0 points 0 points  Repeat phrase 0 points 2 points  Total Score 0 points 2 points    Immunizations Immunization History  Administered Date(s) Administered   Fluad Trivalent(High Dose 65+) 11/05/2022   Fluzone Influenza virus vaccine,trivalent (IIV3), split  virus 12/11/2016   Influenza Inj Mdck Quad Pf 12/11/2016   Influenza Nasal 12/21/2015   Influenza Split 10/27/2018   Influenza, Seasonal, Injecte, Preservative Fre 12/11/2016   Influenza,Quad,Nasal, Live 12/21/2015   Influenza,inj,Quad PF,6+ Mos 12/11/2016, 12/11/2017, 09/29/2021   Influenza,inj,quad, With Preservative 12/11/2016   Influenza-Unspecified 12/11/2016, 12/11/2017, 10/27/2018, 10/21/2020   Moderna Covid-19 Fall Seasonal Vaccine 64yrs & older 11/15/2021   Moderna Covid-19 Vaccine  Bivalent Booster 4yrs & up 10/21/2020, 09/30/2023   PFIZER(Purple Top)SARS-COV-2 Vaccination 04/03/2019, 04/22/2019, 10/23/2019   Pneumococcal Conjugate-13 06/23/2020   Pneumococcal Polysaccharide-23 02/10/2015, 08/10/2021   Pneumococcal-Unspecified 05/31/2020   Tdap 10/20/2019, 04/22/2022   Unspecified SARS-COV-2 Vaccination 04/03/2019, 04/22/2019, 10/23/2019   Zoster Recombinant(Shingrix) 09/19/2016, 02/14/2018, 10/17/2018    Screening Tests Health Maintenance  Topic Date Due   Lung Cancer Screening  02/15/2023   Influenza Vaccine  08/23/2023   COVID-19 Vaccine (10 - 2025-26 season) 11/25/2023   Medicare Annual Wellness (AWV)  10/24/2024   Mammogram  07/15/2025   Colonoscopy  10/31/2026   DTaP/Tdap/Td (3 - Td or Tdap) 04/21/2032   Pneumococcal Vaccine: 50+ Years  Completed   DEXA SCAN  Completed   Hepatitis C Screening  Completed   Zoster Vaccines- Shingrix  Completed   HPV VACCINES  Aged Out   Meningococcal B Vaccine  Aged Out    Health Maintenance Items Addressed: Vaccines  Due: flu  Additional Screening:  Vision Screening: Recommended annual ophthalmology exams for early detection of glaucoma and other disorders of the eye. Is the patient up to date with their annual eye exam?  Yes  Who is the provider or what is the name of the office in which the patient attends annual eye exams? [facility]  Dental Screening: Recommended annual dental exams for proper oral  hygiene  Community Resource Referral / Chronic Care Management: CRR required this visit?  No   CCM required this visit?  No   Plan:    I have personally reviewed and noted the following in the patient's chart:   Medical and social history Use of alcohol, tobacco or illicit drugs  Current medications and supplements including opioid prescriptions. Patient is not currently taking opioid prescriptions. Functional ability and status Nutritional status Physical activity Advanced directives List of other physicians Hospitalizations, surgeries, and ER visits in previous 12 months Vitals Screenings to include cognitive, depression, and falls Referrals and appointments  In addition, I have reviewed and discussed with patient certain preventive protocols, quality metrics, and best practice recommendations. A written personalized care plan for preventive services as well as general preventive health recommendations were provided to patient.   Ardella FORBES Dawn, LPN   [id number]   After Visit Summary: (MyChart) Due to this being a telephonic visit, the after visit summary with patients personalized plan was offered to patient via MyChart   Notes: Nothing significant to report at this time.

## 2023-10-25 NOTE — Patient Instructions (Signed)
 Barbara Lawson,  Thank you for taking the time for your Medicare Wellness Visit. I appreciate your continued commitment to your health goals. Please review the care plan we discussed, and feel free to reach out if I can assist you further.  Medicare recommends these wellness visits once per year to help you and your care team stay ahead of potential health issues. These visits are designed to focus on prevention, allowing your provider to concentrate on managing your acute and chronic conditions during your regular appointments.  Please note that Annual Wellness Visits do not include a physical exam. Some assessments may be limited, especially if the visit was conducted virtually. If needed, we may recommend a separate in-person follow-up with your provider.  Ongoing Care Seeing your primary care provider every 3 to 6 months helps us  monitor your health and provide consistent, personalized care.   Referrals If a referral was made during today's visit and you haven't received any updates within two weeks, please contact the referred provider directly to check on the status.  Recommended Screenings:  Health Maintenance  Topic Date Due   Screening for Lung Cancer  02/15/2023   Flu Shot  08/23/2023   COVID-19 Vaccine (10 - 2025-26 season) 11/25/2023   Medicare Annual Wellness Visit  10/24/2024   Breast Cancer Screening  07/15/2025   Colon Cancer Screening  10/31/2026   DTaP/Tdap/Td vaccine (3 - Td or Tdap) 04/21/2032   Pneumococcal Vaccine for age over 74  Completed   DEXA scan (bone density measurement)  Completed   Hepatitis C Screening  Completed   Zoster (Shingles) Vaccine  Completed   HPV Vaccine  Aged Out   Meningitis B Vaccine  Aged Out       10/25/2023   11:37 AM  Advanced Directives  Does Patient Have a Medical Advance Directive? No   Advance Care Planning is important because it: Ensures you receive medical care that aligns with your values, goals, and preferences. Provides  guidance to your family and loved ones, reducing the emotional burden of decision-making during critical moments.  Vision: Annual vision screenings are recommended for early detection of glaucoma, cataracts, and diabetic retinopathy. These exams can also reveal signs of chronic conditions such as diabetes and high blood pressure.  Dental: Annual dental screenings help detect early signs of oral cancer, gum disease, and other conditions linked to overall health, including heart disease and diabetes.  Please see the attached documents for additional preventive care recommendations.

## 2023-10-29 ENCOUNTER — Encounter: Payer: Self-pay | Admitting: Family Medicine

## 2023-10-29 ENCOUNTER — Ambulatory Visit: Admitting: Family Medicine

## 2023-10-29 VITALS — BP 122/78 | HR 82 | Temp 97.0°F | Ht 64.0 in | Wt 125.2 lb

## 2023-10-29 DIAGNOSIS — R4189 Other symptoms and signs involving cognitive functions and awareness: Secondary | ICD-10-CM

## 2023-10-29 DIAGNOSIS — R197 Diarrhea, unspecified: Secondary | ICD-10-CM | POA: Diagnosis not present

## 2023-10-29 DIAGNOSIS — K580 Irritable bowel syndrome with diarrhea: Secondary | ICD-10-CM | POA: Diagnosis not present

## 2023-10-29 DIAGNOSIS — Z131 Encounter for screening for diabetes mellitus: Secondary | ICD-10-CM | POA: Diagnosis not present

## 2023-10-29 DIAGNOSIS — Z13220 Encounter for screening for lipoid disorders: Secondary | ICD-10-CM

## 2023-10-29 DIAGNOSIS — Z23 Encounter for immunization: Secondary | ICD-10-CM

## 2023-10-29 DIAGNOSIS — E039 Hypothyroidism, unspecified: Secondary | ICD-10-CM | POA: Diagnosis not present

## 2023-10-29 DIAGNOSIS — R519 Headache, unspecified: Secondary | ICD-10-CM

## 2023-10-29 LAB — CBC WITH DIFFERENTIAL/PLATELET
Basophils Absolute: 0.1 K/uL (ref 0.0–0.1)
Basophils Relative: 1 % (ref 0.0–3.0)
Eosinophils Absolute: 0.1 K/uL (ref 0.0–0.7)
Eosinophils Relative: 1.5 % (ref 0.0–5.0)
HCT: 43.4 % (ref 36.0–46.0)
Hemoglobin: 14.4 g/dL (ref 12.0–15.0)
Lymphocytes Relative: 24.5 % (ref 12.0–46.0)
Lymphs Abs: 2.2 K/uL (ref 0.7–4.0)
MCHC: 33.1 g/dL (ref 30.0–36.0)
MCV: 89.8 fl (ref 78.0–100.0)
Monocytes Absolute: 0.7 K/uL (ref 0.1–1.0)
Monocytes Relative: 7.3 % (ref 3.0–12.0)
Neutro Abs: 5.9 K/uL (ref 1.4–7.7)
Neutrophils Relative %: 65.7 % (ref 43.0–77.0)
Platelets: 331 K/uL (ref 150.0–400.0)
RBC: 4.84 Mil/uL (ref 3.87–5.11)
RDW: 13.7 % (ref 11.5–15.5)
WBC: 9 K/uL (ref 4.0–10.5)

## 2023-10-29 LAB — COMPREHENSIVE METABOLIC PANEL WITH GFR
ALT: 16 U/L (ref 0–35)
AST: 20 U/L (ref 0–37)
Albumin: 4.6 g/dL (ref 3.5–5.2)
Alkaline Phosphatase: 42 U/L (ref 39–117)
BUN: 16 mg/dL (ref 6–23)
CO2: 28 meq/L (ref 19–32)
Calcium: 10 mg/dL (ref 8.4–10.5)
Chloride: 104 meq/L (ref 96–112)
Creatinine, Ser: 0.7 mg/dL (ref 0.40–1.20)
GFR: 88.89 mL/min (ref >60.00)
Glucose, Bld: 95 mg/dL (ref 70–99)
Potassium: 4.3 meq/L (ref 3.5–5.1)
Sodium: 140 meq/L (ref 135–145)
Total Bilirubin: 0.6 mg/dL (ref 0.2–1.2)
Total Protein: 7.2 g/dL (ref 6.0–8.3)

## 2023-10-29 LAB — HEMOGLOBIN A1C: Hgb A1c MFr Bld: 5.7 % (ref 4.6–6.5)

## 2023-10-29 LAB — TSH: TSH: 0.51 u[IU]/mL (ref 0.35–5.50)

## 2023-10-29 LAB — LDL CHOLESTEROL, DIRECT: Direct LDL: 173 mg/dL

## 2023-10-29 MED ORDER — COLESTIPOL HCL 1 G PO TABS: ORAL_TABLET | ORAL | 1 refills | Status: AC

## 2023-10-29 NOTE — Progress Notes (Signed)
 Established Patient Office Visit   Subjective:  Patient ID: Barbara Lawson, female    DOB: 1955-07-13  Age: 68 y.o. MRN: [id number]  Chief Complaint  Patient presents with   Medical Management of Chronic Issues    Follow up. Pt is if fasting.    Headache    Headache located behind her ear x 1 month.    Memory Loss    Pt states she has had some memory issues where she sometimes forget what she is doing.     Headache  Associated symptoms include insomnia. Pertinent negatives include no abdominal pain, blurred vision, eye redness, nausea, photophobia, tingling, vomiting or weakness.   Encounter Diagnoses  Name Primary?   Nonintractable headache, unspecified chronicity pattern, unspecified headache type Yes   Immunization due    Diarrhea, unspecified type    Hypothyroidism, unspecified type    Irritable bowel syndrome with diarrhea    Screening for cholesterol level    Screening for diabetes mellitus    Disturbed cognition    For follow-up of above.  4-week history of a worsening headache on the right side of her head in the back.  There is no nausea, scotomata or light sensitivity.  It occurs in the evening hours and then resolves.  Seems to be worse after a dental crown was placed a few weeks ago.  Distant history of headaches.  She does not sleep well at night.  She has been concerned about her mental function.  Sometimes she does not remember how to use her phone as an example.  She does not sleep well at night.  Denies depression or anxiety.  Requests a refill of her colestipol .  It has been helpful.  She did see gastroenterology.  She is seeing sports medicine for treatment of her osteoporosis with Prolia .  Planning travel to [place] soon.    Review of Systems  Constitutional: Negative.   HENT: Negative.    Eyes:  Negative for blurred vision, photophobia, discharge and redness.  Respiratory: Negative.    Cardiovascular: Negative.   Gastrointestinal:  Positive for diarrhea.  Negative for abdominal pain, nausea and vomiting.  Genitourinary: Negative.   Musculoskeletal: Negative.  Negative for myalgias.  Skin:  Negative for rash.  Neurological:  Positive for headaches. Negative for tingling, loss of consciousness and weakness.  Endo/Heme/Allergies:  Negative for polydipsia.  Psychiatric/Behavioral:  Negative for depression. The patient has insomnia. The patient is not nervous/anxious.       10/29/2023   10:30 AM  MMSE - Mini Mental State Exam  Orientation to time 5  Orientation to Place 1  Registration 3  Attention/ Calculation 5  Recall 3  Language- name 2 objects 2  Language- repeat 1  Language- follow 3 step command 3  Language- read & follow direction 1  Write a sentence 1  Copy design 1  Total score 26        10/29/2023   11:18 AM 10/25/2023   11:40 AM 11/05/2022    2:09 PM  Depression screen PHQ 2/9  Decreased Interest 0 0 0  Down, Depressed, Hopeless 0 0 0  PHQ - 2 Score 0 0 0  Altered sleeping 3  0  Tired, decreased energy 0  0  Change in appetite 0  0  Feeling bad or failure about yourself  0  0  Trouble concentrating 0  0  Moving slowly or fidgety/restless 0  0  Suicidal thoughts 0  0  PHQ-9 Score 3  0  Difficult doing work/chores Somewhat difficult  Not difficult at all      Current Outpatient Medications:    ARMOUR THYROID  30 MG tablet, Take 30 mg by mouth every morning., Disp: , Rfl:    ARMOUR THYROID  60 MG tablet, Take 60 mg by mouth every morning., Disp: , Rfl:    atorvastatin  (LIPITOR) 10 MG tablet, Take 1 tablet (10 mg total) by mouth daily., Disp: 90 tablet, Rfl: 3   cholecalciferol (VITAMIN D ) 25 MCG (1000 UNIT) tablet, Take 2,000 Units by mouth daily., Disp: , Rfl:    clonazePAM (KLONOPIN) 0.5 MG tablet, Take 0.25 mg by mouth at bedtime. Outside prescriber holistic MD Dr. Marsa Lawless, Disp: , Rfl:    Fluticasone -Umeclidin-Vilant (TRELEGY ELLIPTA) 100-62.5-25 MCG/ACT AEPB, Inhale into the lungs., Disp: , Rfl:     Fluticasone -Umeclidin-Vilant (TRELEGY ELLIPTA) 200-62.5-25 MCG/ACT AEPB, Inhale into the lungs., Disp: , Rfl:    ipratropium (ATROVENT) 0.02 % nebulizer solution, Take 500 mcg by nebulization 4 (four) times daily., Disp: , Rfl:    Ipratropium-Albuterol (COMBIVENT RESPIMAT) 20-100 MCG/ACT AERS respimat, Inhale 1 puff into the lungs every 6 (six) hours as needed for wheezing., Disp: , Rfl:    progesterone (PROMETRIUM) 200 MG capsule, Take 300 mg by mouth at bedtime., Disp: , Rfl:    theophylline  (THEODUR) 300 MG 12 hr tablet, Take 1.5 tablets by mouth 2 (two) times daily., Disp: , Rfl:    valACYclovir  (VALTREX ) 1000 MG tablet, 2 pills x 1 dose then X bid 3-7 days with outbreak until resolution, Disp: 90 tablet, Rfl: 3   colestipol  (COLESTID ) 1 g tablet, Take 2 tablets twice daily with meals, Disp: 60 tablet, Rfl: 1   gabapentin (NEURONTIN) 100 MG capsule, Take by mouth. (Patient not taking: Reported on 10/29/2023), Disp: , Rfl:    hydrochlorothiazide  (HYDRODIURIL ) 25 MG tablet, Take 0.5 tablets (12.5 mg total) by mouth daily. (Patient not taking: Reported on 10/29/2023), Disp: 15 tablet, Rfl: 1   ondansetron  (ZOFRAN ) 4 MG tablet, , Disp: , Rfl:    varenicline  (CHANTIX ) 0.5 MG tablet, PLEASE SEE ATTACHED FOR DETAILED DIRECTIONS (Patient not taking: Reported on 10/29/2023), Disp: , Rfl:    Objective:     BP 122/78 (BP Location: Right Arm, Patient Position: Sitting, Cuff Size: Normal)   Pulse 82   Temp (!) 97 F (36.1 C) (Temporal)   Ht 5' 4 (1.626 m)   Wt 125 lb 3.2 oz (56.8 kg)   SpO2 98%   BMI 21.49 kg/m    Physical Exam Constitutional:      General: She is not in acute distress.    Appearance: Normal appearance. She is not ill-appearing, toxic-appearing or diaphoretic.  HENT:     Head: Normocephalic and atraumatic.     Right Ear: External ear normal.     Left Ear: External ear normal.     Mouth/Throat:     Mouth: Mucous membranes are moist.     Pharynx: Oropharynx is clear. No  oropharyngeal exudate or posterior oropharyngeal erythema.  Eyes:     General: No visual field deficit or scleral icterus.       Right eye: No discharge.        Left eye: No discharge.     Extraocular Movements: Extraocular movements intact.     Conjunctiva/sclera: Conjunctivae normal.     Pupils: Pupils are equal, round, and reactive to light.  Cardiovascular:     Rate and Rhythm: Normal rate and regular rhythm.  Pulmonary:     Effort:  Pulmonary effort is normal. No respiratory distress.     Breath sounds: Normal breath sounds.  Abdominal:     General: Bowel sounds are normal.  Musculoskeletal:     Cervical back: No rigidity or tenderness.  Skin:    General: Skin is warm and dry.  Neurological:     Mental Status: She is alert and oriented to person, place, and time.     Cranial Nerves: Cranial nerves 2-12 are intact. No cranial nerve deficit, dysarthria or facial asymmetry.  Psychiatric:        Mood and Affect: Mood normal.        Behavior: Behavior normal.      No results found for any visits on 10/29/23.    The ASCVD Risk score (Arnett DK, et al., 2019) failed to calculate for the following reasons:   Cannot find a previous HDL lab   Cannot find a previous total cholesterol lab    Assessment & Plan:   Nonintractable headache, unspecified chronicity pattern, unspecified headache type -     CBC with Differential/Platelet -     CT HEAD W & WO CONTRAST ( ); Future  Immunization due -     Flu vaccine HIGH DOSE PF(Fluzone Trivalent)  Diarrhea, unspecified type -     Colestipol  HCl; Take 2 tablets twice daily with meals  Dispense: 60 tablet; Refill: 1  Hypothyroidism, unspecified type -     TSH  Irritable bowel syndrome with diarrhea  Screening for cholesterol level -     Comprehensive metabolic panel with GFR -     LDL cholesterol, direct  Screening for diabetes mellitus -     Comprehensive metabolic panel with GFR -     Hemoglobin A1c  Disturbed  cognition    Return in about 6 months (around 04/28/2024).  Did well with the Mini-Mental status exam.  Will work on improving exercise and nutrition.  We discussed using Elavil for sleep and headache.  She will consider it.  CT scan ordered considering progressive nature of headache.  Could consider neurology referral.  Elsie Sim Lent, MD

## 2023-10-29 NOTE — Progress Notes (Unsigned)
 [facility] [address] [place] [address] Phone: [phone] Subjective:   LILLETTE Berwyn Posey, am serving as a scribe for Dr. Arthea Claudene.  I'm seeing this patient by the request  of:  Berneta Elsie Sayre, MD  CC: back pain, hip pain   YEP:Dlagzrupcz  08/01/2023 Patient given Prolia , will need to likely continue at this time.  Started by another provider and will follow-up with them for these injections.  Medication was given by pharmacy.    Pain in the right hip is multifactorial.   Updated 10/31/2023 Clarinda Obi is a 68 y.o. female coming in with complaint of B hip pain. Patient states that she is doing better. Has had a few times where she cannot walk. Pain in anterior aspect. Pain will switch from side to side. Has to use cane at times.   Patient is doing Prolia  through Dr. Joane. Needs to try to reschedule appt.        Past Medical History:  Diagnosis Date   ANEMIA-NOS 09/28/2009   Aortic atherosclerosis    ASTHMA 09/28/2009   moderate persistent    CAD (coronary artery disease)    Colon polyps    COPD (chronic obstructive pulmonary disease) (HCC)    COVID-19    01/20/2020   Emphysema lung (HCC)    vs bronchitis;paraseptal    Herpes    Herpes labialis 05/30/2023   HPV in female    hpv 20+ years    Hyperlipemia    HYPOTHYROIDISM 09/28/2009   Lung nodule    Osteoporosis    Pneumonia    hosp in 2018   Thyroid  nodule    right thyroid  5.2 x 1.8 x 1.9 cm right lobe   Past Surgical History:  Procedure Laterality Date   COLONOSCOPY     COLONOSCOPY WITH PROPOFOL  N/A 10/30/2021   Procedure: COLONOSCOPY WITH PROPOFOL ;  Surgeon: Therisa Bi, MD;  Location: [facility] ENDOSCOPY;  Service: Gastroenterology;  Laterality: N/A;   ESOPHAGOGASTRODUODENOSCOPY N/A 10/30/2021   Procedure: ESOPHAGOGASTRODUODENOSCOPY (EGD);  Surgeon: Therisa Bi, MD;  Location: [facility] ENDOSCOPY;  Service: Gastroenterology;  Laterality: N/A;   WRIST SURGERY  Right 05/03/2022   Social History   Socioeconomic History   Marital status: Divorced    Spouse name: Not on file   Number of children: Not on file   Years of education: Not on file   Highest education level: Bachelor's degree (e.g., BA, AB, BS)  Occupational History   Not on file  Tobacco Use   Smoking status: Former    Current packs/day: 0.00    Types: Cigarettes    Quit date: 01/26/2013    Years since quitting: 10.7   Smokeless tobacco: Never  Vaping Use   Vaping status: Former  Substance and Sexual Activity   Alcohol use: Yes    Alcohol/week: 3.0 standard drinks of alcohol    Types: 3 Standard drinks or equivalent per week   Drug use: No   Sexual activity: Not Currently  Other Topics Concern   Not on file  Social History Narrative   Lives alone with dog    Moved from [place]    Bachelors degree massage therapy    Age 68 y.o abortion    lmp age 68    Social Drivers of Corporate investment banker Strain: Low Risk  (10/21/2023)   Overall Financial Resource Strain (CARDIA)    Difficulty of Paying Living Expenses: Not hard at all  Food Insecurity: No Food Insecurity (  10/21/2023)   Hunger Vital Sign    Worried About Running Out of Food in the Last Year: Never true    Ran Out of Food in the Last Year: Never true  Transportation Needs: No Transportation Needs (10/21/2023)   PRAPARE - Administrator, Civil Service (Medical): No    Lack of Transportation (Non-Medical): No  Physical Activity: Sufficiently Active (10/21/2023)   Exercise Vital Sign    Days of Exercise per Week: 3 days    Minutes of Exercise per Session: 50 min  Recent Concern: Physical Activity - Insufficiently Active (09/17/2023)   Exercise Vital Sign    Days of Exercise per Week: 2 days    Minutes of Exercise per Session: 20 min  Stress: No Stress Concern Present (10/21/2023)   [employer] of Occupational Health - Occupational Stress Questionnaire    Feeling of Stress: Only a little  Social  Connections: Socially Isolated (10/21/2023)   Social Connection and Isolation Panel    Frequency of Communication with Friends and Family: Twice a week    Frequency of Social Gatherings with Friends and Family: Once a week    Attends Religious Services: Never    Database administrator or Organizations: No    Attends Engineer, structural: Not on file    Marital Status: Divorced   Allergies  Allergen Reactions   Misc. Sulfonamide Containing Compounds Rash and Swelling    Patient Reports It Only Occurred with The Suppository Formulation, Patient Reports It Only Occurred with The Suppository Formulation, Patient Reports It Only Occurred with The Suppository Formulation   Sulfa Antibiotics Rash and Swelling    Patient Reports It Only Occurred with The Suppository Formulation  Patient Reports It Only Occurred with The Suppository Formulation Patient Reports It Only Occurred with The Suppository Formulation Patient Reports It Only Occurred with The Suppository Formulation  Patient Reports It Only Occurred with The Suppository Formulation  Patient Reports It Only Occurred with The Suppository Formulation Patient Reports It Only Occurred with The Suppository Formulation Patient Reports It Only Occurred with The Suppository Formulation    Patient Reports It Only Occurred with The Suppository Formulation Patient Reports It Only Occurred with The Suppository Formulation Patient Reports It Only Occurred with The Suppository Formulation    Patient Reports It Only Occurred with The Suppository Formulation   Cat Dander    Dust Mite Extract     Other Reaction(s): Other (See Comments)  Bothers asthma     Bothers asthma   Sulfonamide Derivatives    Family History  Problem Relation Age of Onset   Cancer Mother    Asthma Father    Cancer Father    Hyperlipidemia Father    Alzheimer's disease Father    Hyperlipidemia Brother     Current Outpatient Medications (Endocrine & Metabolic):     ARMOUR THYROID  30 MG tablet, Take 30 mg by mouth every morning.   ARMOUR THYROID  60 MG tablet, Take 60 mg by mouth every morning.   progesterone (PROMETRIUM) 200 MG capsule, Take 300 mg by mouth at bedtime.  Current Outpatient Medications (Cardiovascular):    atorvastatin  (LIPITOR) 10 MG tablet, Take 1 tablet (10 mg total) by mouth daily.   colestipol  (COLESTID ) 1 g tablet, Take 2 tablets twice daily with meals   hydrochlorothiazide  (HYDRODIURIL ) 25 MG tablet, Take 0.5 tablets (12.5 mg total) by mouth daily.  Current Outpatient Medications (Respiratory):    Fluticasone -Umeclidin-Vilant (TRELEGY ELLIPTA) 100-62.5-25 MCG/ACT AEPB, Inhale into the lungs.  Fluticasone -Umeclidin-Vilant (TRELEGY ELLIPTA) 200-62.5-25 MCG/ACT AEPB, Inhale into the lungs.   ipratropium (ATROVENT) 0.02 % nebulizer solution, Take 500 mcg by nebulization 4 (four) times daily.   Ipratropium-Albuterol (COMBIVENT RESPIMAT) 20-100 MCG/ACT AERS respimat, Inhale 1 puff into the lungs every 6 (six) hours as needed for wheezing.   theophylline  (THEODUR) 300 MG 12 hr tablet, Take 1.5 tablets by mouth 2 (two) times daily.    Current Outpatient Medications (Other):    cholecalciferol (VITAMIN D ) 25 MCG (1000 UNIT) tablet, Take 2,000 Units by mouth daily.   clonazePAM (KLONOPIN) 0.5 MG tablet, Take 0.25 mg by mouth at bedtime. Outside prescriber holistic MD Dr. Marsa Augustides   gabapentin (NEURONTIN) 100 MG capsule, Take by mouth.   ondansetron  (ZOFRAN ) 4 MG tablet,    valACYclovir  (VALTREX ) 1000 MG tablet, 2 pills x 1 dose then X bid 3-7 days with outbreak until resolution   varenicline  (CHANTIX ) 0.5 MG tablet, PLEASE SEE ATTACHED FOR DETAILED DIRECTIONS     Objective  Blood pressure 128/82, pulse 71, height 5' 4 (1.626 m), weight 128 lb (58.1 kg), SpO2 98%.   General: No apparent distress alert and oriented x3 mood and affect normal, dressed appropriately.  HEENT: Pupils equal, extraocular movements intact   Respiratory: Patient's speak in full sentences and does not appear short of breath  Cardiovascular: No lower extremity edema, non tender, no erythema  Tightness noted in hip flexor but only with full external range of motion of the hips, no tenderness on exam of the back today  Negative SLT. Full symmetric strength of the lower extremities today  NVI distally     Impression and Recommendations:    The above documentation has been reviewed and is accurate and complete Torsha Lemus M Beautifull Cisar, DO

## 2023-10-31 ENCOUNTER — Ambulatory Visit: Payer: Self-pay | Admitting: Family Medicine

## 2023-10-31 ENCOUNTER — Ambulatory Visit: Admitting: Family Medicine

## 2023-10-31 VITALS — BP 128/82 | HR 71 | Ht 64.0 in | Wt 128.0 lb

## 2023-10-31 DIAGNOSIS — G8929 Other chronic pain: Secondary | ICD-10-CM

## 2023-10-31 DIAGNOSIS — M545 Low back pain, unspecified: Secondary | ICD-10-CM | POA: Diagnosis not present

## 2023-10-31 NOTE — Telephone Encounter (Signed)
 Pt will be out of town until 02/06/24, discussed [name] VS Prolia .

## 2023-10-31 NOTE — Patient Instructions (Signed)
 Doing great Limit ROM See me in 3 months if needed

## 2023-11-01 NOTE — Assessment & Plan Note (Signed)
 Continue low back and groin pain.  Discussed Hip flexor tightness but patient also has hypermobility.  Discussed hip abductor strengthening as well discussed the importance of stability.   Continue to be active, no med changes  RTCin 3-6 months

## 2023-11-07 DIAGNOSIS — Z6821 Body mass index (BMI) 21.0-21.9, adult: Secondary | ICD-10-CM | POA: Diagnosis not present

## 2023-11-11 ENCOUNTER — Telehealth: Payer: Self-pay

## 2023-11-11 NOTE — Telephone Encounter (Signed)
 Patient is requesting CT order be changed from Head to Head & neck. Please advise   Copied from CRM #[id number]. Topic: Clinical - Request for Lab/Test Order >> Nov 11, 2023  9:22 AM Rea C wrote: Reason for CRM: Patient called in requesting if the CT Scan order can be changed to scan for her head and neck because she is also experiencing neck pain.    [phone] (M) or MyChart. Patient would prefer a message through MyChart because she may not have her phone on her end. She would like to know when its changed so she can schedule the appointment for the CT Scan.

## 2023-11-18 ENCOUNTER — Ambulatory Visit
Admission: RE | Admit: 2023-11-18 | Discharge: 2023-11-18 | Disposition: A | Discharge status: Home / Self Care | Source: Ambulatory Visit | Attending: Family Medicine | Admitting: Family Medicine

## 2023-11-18 DIAGNOSIS — R519 Headache, unspecified: Secondary | ICD-10-CM

## 2023-11-18 MED ORDER — IOPAMIDOL (ISOVUE-300) INJECTION 61%
75.0000 mL | Freq: Once | INTRAVENOUS | Status: AC | PRN
  Administered 2023-11-18: 75 mL via INTRAVENOUS

## 2023-11-26 ENCOUNTER — Ambulatory Visit (INDEPENDENT_AMBULATORY_CARE_PROVIDER_SITE_OTHER): Admitting: Internal Medicine

## 2023-11-26 ENCOUNTER — Encounter: Payer: Self-pay | Admitting: Internal Medicine

## 2023-11-26 VITALS — BP 128/82 | HR 97 | Temp 98.9°F | Ht 64.0 in | Wt 128.2 lb

## 2023-11-26 DIAGNOSIS — B009 Herpesviral infection, unspecified: Secondary | ICD-10-CM

## 2023-11-26 DIAGNOSIS — Z7189 Other specified counseling: Secondary | ICD-10-CM

## 2023-11-26 DIAGNOSIS — M542 Cervicalgia: Secondary | ICD-10-CM

## 2023-11-26 DIAGNOSIS — R519 Headache, unspecified: Secondary | ICD-10-CM

## 2023-11-26 DIAGNOSIS — Z789 Other specified health status: Secondary | ICD-10-CM

## 2023-11-26 MED ORDER — VALACYCLOVIR HCL 1 G PO TABS: ORAL_TABLET | ORAL | 0 refills | Status: AC

## 2023-11-26 MED ORDER — ACYCLOVIR 5 % EX OINT: 1.0000 | TOPICAL_OINTMENT | CUTANEOUS | 0 refills | Status: AC

## 2023-11-26 NOTE — Progress Notes (Unsigned)
 [facility] [address] Barbara Lawson [address] Dept: [phone] Dept Fax: [phone]  Acute Care Office Visit  Subjective:   Barbara Lawson 1955-11-13 11/26/2023  Chief Complaint  Patient presents with   Neck Pain    Right side neck pain 4 months wanting to know what CT scan result.    HPI:  Discussed the use of AI scribe software for clinical note transcription with the patient, who gave verbal consent to proceed.  History of Present Illness   Barbara Lawson is a 68 year old female who presents to discuss recent CT results.   She has been experiencing neck pain for the past four to five months, primarily located at the base of her skull. The pain initially prevented her from sleeping comfortably, as even the pillow caused discomfort. She initially thought the pain was exacerbated by recent dental work, but it was present before the procedure. She has been using Motrin for relief, which has been effective. She has also tried self-massage and ice, but only Motrin has provided significant relief.  A CT scan of the head was performed by her PCP, which showed no intracranial abnormalities. She expressed concerns about the circulation in her neck, but patient was advised the CT head does not assess the vasculature in the neck. She is worried about her brain health, mentioning 'foggy brain' and concerns about dementia. She has been trying different brain supplements and exercise to help with these issues. Neck pain/occipital head pain has continued to slowly improve over the past couple months.   She has a history of cold sores on her lips, which tend to occur during vacations, possibly due to stress. She uses Valaciclovir tablets and acyclovir ointment to manage outbreaks. She needs refills.      The following portions of the patient's history were reviewed and updated as appropriate: past medical history, past surgical history,  family history, social history, allergies, medications, and problem list.   Patient Active Problem List   Diagnosis Date Noted   Gluteal tendinitis of right buttock 06/05/2023   Diarrhea 03/21/2023   Status post open reduction and internal fixation (ORIF) of fracture 07/24/2022   Hematoma of hip, right, sequela 04/05/2022   Patellar tendinitis of left knee 03/29/2022   Lipoma of right lower extremity 03/29/2022   COVID-19 01/08/2022   History of colonic polyps    Other dysphagia    Need for influenza vaccination 09/29/2021   Mid back pain 01/05/2021   Cervicalgia 01/05/2021   Scoliosis 01/05/2021   Bilateral carotid artery stenosis 10/03/2020   Tubular adenoma of colon 08/04/2020   Anxiety and depression 07/01/2020   Chronic pain syndrome 07/01/2020   Pain of right hip 07/01/2020   Chronic low back pain 07/01/2020   Left knee pain 07/01/2020   Cyst of left ovary 06/28/2020   Elevated lipoprotein(a) 06/28/2020   Herpes 06/28/2020   Thyroiditis 06/28/2020   Former smoker 06/28/2020   Calciuria 06/28/2020   HPV in female    Osteoporosis    Lung nodules    CAD (coronary artery disease)    Thyroid  nodule    Aortic atherosclerosis    Colon polyps    Familial hyperlipidemia    Coronary artery disease involving native coronary artery of native heart without angina pectoris 05/31/2020   Idiopathic hypercalciuria 01/06/2020   Right ankle sprain 12/03/2018   Increased band cell count 12/15/2016   Anxiety 02/03/2016   Healthcare maintenance 12/27/2015   Cough 10/03/2015  Hoarseness 10/03/2015   History of herpes labialis 09/06/2015   Symptomatic postsurgical menopause 02/16/2015   Vaginal atrophy 02/16/2015   Hypothyroidism 09/28/2009   Asthma 09/28/2009   Moderate persistent asthma without complication 09/28/2009   Past Medical History:  Diagnosis Date   ANEMIA-NOS 09/28/2009   Aortic atherosclerosis    ASTHMA 09/28/2009   moderate persistent    CAD (coronary artery  disease)    Colon polyps    COPD (chronic obstructive pulmonary disease) (HCC)    COVID-19    01/20/2020   Emphysema lung (HCC)    vs bronchitis;paraseptal    Herpes    Herpes labialis 05/30/2023   HPV in female    hpv 20+ years    Hyperlipemia    HYPOTHYROIDISM 09/28/2009   Lung nodule    Osteoporosis    Pneumonia    hosp in 2018   Thyroid  nodule    right thyroid  5.2 x 1.8 x 1.9 cm right lobe   Past Surgical History:  Procedure Laterality Date   COLONOSCOPY     COLONOSCOPY WITH PROPOFOL  N/A 10/30/2021   Procedure: COLONOSCOPY WITH PROPOFOL ;  Surgeon: Therisa Bi, MD;  Location: [facility] ENDOSCOPY;  Service: Gastroenterology;  Laterality: N/A;   ESOPHAGOGASTRODUODENOSCOPY N/A 10/30/2021   Procedure: ESOPHAGOGASTRODUODENOSCOPY (EGD);  Surgeon: Therisa Bi, MD;  Location: [facility] ENDOSCOPY;  Service: Gastroenterology;  Laterality: N/A;   WRIST SURGERY Right 05/03/2022   Family History  Problem Relation Age of Onset   Cancer Mother    Asthma Father    Cancer Father    Hyperlipidemia Father    Alzheimer's disease Father    Hyperlipidemia Brother     Current Outpatient Medications:    colestipol  (COLESTID ) 1 g tablet, Take 2 tablets twice daily with meals, Disp: 60 tablet, Rfl: 1   acyclovir ointment (ZOVIRAX) 5 %, Apply 1 Application topically every 3 (three) hours., Disp: 240 g, Rfl: 0   ARMOUR THYROID  30 MG tablet, Take 30 mg by mouth every morning., Disp: , Rfl:    ARMOUR THYROID  60 MG tablet, Take 60 mg by mouth every morning., Disp: , Rfl:    atorvastatin  (LIPITOR) 10 MG tablet, Take 1 tablet (10 mg total) by mouth daily. (Patient not taking: Reported on 11/26/2023), Disp: 90 tablet, Rfl: 3   cholecalciferol (VITAMIN D ) 25 MCG (1000 UNIT) tablet, Take 2,000 Units by mouth daily., Disp: , Rfl:    clonazePAM (KLONOPIN) 0.5 MG tablet, Take 0.25 mg by mouth at bedtime. Outside prescriber holistic MD Dr. Marsa Lawless, Disp: , Rfl:    Fluticasone -Umeclidin-Vilant (TRELEGY  ELLIPTA) 100-62.5-25 MCG/ACT AEPB, Inhale into the lungs., Disp: , Rfl:    Fluticasone -Umeclidin-Vilant (TRELEGY ELLIPTA) 200-62.5-25 MCG/ACT AEPB, Inhale into the lungs., Disp: , Rfl:    gabapentin (NEURONTIN) 100 MG capsule, Take by mouth., Disp: , Rfl:    hydrochlorothiazide  (HYDRODIURIL ) 25 MG tablet, Take 0.5 tablets (12.5 mg total) by mouth daily. (Patient not taking: Reported on 11/26/2023), Disp: 15 tablet, Rfl: 1   ipratropium (ATROVENT) 0.02 % nebulizer solution, Take 500 mcg by nebulization 4 (four) times daily., Disp: , Rfl:    Ipratropium-Albuterol (COMBIVENT RESPIMAT) 20-100 MCG/ACT AERS respimat, Inhale 1 puff into the lungs every 6 (six) hours as needed for wheezing., Disp: , Rfl:    ondansetron  (ZOFRAN ) 4 MG tablet, , Disp: , Rfl:    progesterone (PROMETRIUM) 200 MG capsule, Take 300 mg by mouth at bedtime., Disp: , Rfl:    theophylline  (THEODUR) 300 MG 12 hr tablet, Take 1.5 tablets by mouth  2 (two) times daily., Disp: , Rfl:    valACYclovir  (VALTREX ) 1000 MG tablet, 2 pills x 1 dose then X bid 3-7 days with outbreak until resolution, Disp: 90 tablet, Rfl: 0   varenicline  (CHANTIX ) 0.5 MG tablet, PLEASE SEE ATTACHED FOR DETAILED DIRECTIONS, Disp: , Rfl:  Allergies  Allergen Reactions   Misc. Sulfonamide Containing Compounds Rash and Swelling    Patient Reports It Only Occurred with The Suppository Formulation, Patient Reports It Only Occurred with The Suppository Formulation, Patient Reports It Only Occurred with The Suppository Formulation   Sulfa Antibiotics Rash and Swelling    Patient Reports It Only Occurred with The Suppository Formulation  Patient Reports It Only Occurred with The Suppository Formulation Patient Reports It Only Occurred with The Suppository Formulation Patient Reports It Only Occurred with The Suppository Formulation  Patient Reports It Only Occurred with The Suppository Formulation  Patient Reports It Only Occurred with The Suppository Formulation Patient  Reports It Only Occurred with The Suppository Formulation Patient Reports It Only Occurred with The Suppository Formulation    Patient Reports It Only Occurred with The Suppository Formulation Patient Reports It Only Occurred with The Suppository Formulation Patient Reports It Only Occurred with The Suppository Formulation    Patient Reports It Only Occurred with The Suppository Formulation   Cat Dander    Dust Mite Extract     Other Reaction(s): Other (See Comments)  Bothers asthma     Bothers asthma   Sulfonamide Derivatives      ROS: A complete ROS was performed with pertinent positives/negatives noted in the HPI. The remainder of the ROS are negative.    Objective:   Today's Vitals   11/26/23 1503  BP: 128/82  Pulse: 97  Temp: 98.9 F (37.2 C)  TempSrc: Temporal  SpO2: 98%  Weight: 128 lb 3.2 oz (58.2 kg)  Height: 5' 4 (1.626 m)    GENERAL: Well-appearing, in NAD. Well nourished.  SKIN: Pink, warm and dry. No rash. NECK: Trachea midline. Full ROM w/o pain or tenderness. No lymphadenopathy.  RESPIRATORY: Chest wall symmetrical. Respirations even and non-labored. NEUROLOGIC: Steady, even gait.  PSYCH/MENTAL STATUS: Alert, oriented x 3. Cooperative, appropriate mood and affect.    No results found for any visits on 11/26/23.    Assessment & Plan:  Assessment and Plan    Neck and occipital headache pain Chronic neck and occipital headache pain for 4-5 months. Pain located at the base of the skull, improving over time. CT scan of the head showed no intracranial abnormalities. Pain improved with ibuprofen and self-massage. No current need for muscle relaxants due to improvement and potential drowsiness. - Continue ibuprofen as needed for pain. - Use heating pad to relax muscles. - Consider Biofreeze or roller ball for localized pain relief. - Consider referral to sports medicine or orthopedics if pain persists   Herpes Recurrent herpes labialis, often triggered by  stress or vacations. Uses valacyclovir  tablets and acyclovir ointment at onset of symptoms. - Prescribed valacyclovir  tablets - Prescribed acyclovir ointment for topical application at onset of symptoms.  General Health Maintenance Discussion about living will and advance directives. She expressed need for clarification on paperwork and desires similar to her father's living will, which was drafted by an attorney.  - Arrange for nursing staff to call and explain living will and advance directives paperwork.     Meds ordered this encounter  Medications   valACYclovir  (VALTREX ) 1000 MG tablet    Sig: 2 pills x 1 dose then  X bid 3-7 days with outbreak until resolution    Dispense:  90 tablet    Refill:  0    Supervising Provider:   THOMPSON, AARON B [[id number]]   acyclovir ointment (ZOVIRAX) 5 %    Sig: Apply 1 Application topically every 3 (three) hours.    Dispense:  240 g    Refill:  0    Supervising Provider:   SEBASTIAN BEVERLEY NOVAK [[id number]]   Orders Placed This Encounter  Procedures   AMB Referral VBCI Care Management    Referral Priority:   Routine    Referral Type:   Consultation    Referral Reason:   Care Coordination    Number of Visits Requested:   1   Lab Orders  No laboratory test(s) ordered today   No images are attached to the encounter or orders placed in the encounter.  Return for Scheduled Routine Office Visits and as needed.   Rosina Senters, FNP

## 2023-11-28 ENCOUNTER — Telehealth: Payer: Self-pay

## 2023-11-28 NOTE — Progress Notes (Signed)
 Complex Care Management Note Care Guide Note  11/28/2023 Name: Barbara Lawson MRN: [id number] DOB: [birth year]   Complex Care Management Outreach Attempts: An unsuccessful telephone outreach was attempted today to offer the patient information about available complex care management services.  Follow Up Plan:  Additional outreach attempts will be made to offer the patient complex care management information and services.   Encounter Outcome:  No Answer  [name]  [facility], [facility] VBCI Assistant Direct Dial: [phone]  Fax: [phone]

## 2023-12-02 NOTE — Progress Notes (Unsigned)
 Complex Care Management Note Care Guide Note  12/02/2023 Name: Barbara Lawson MRN: [id number] DOB: 11-01-1955   Complex Care Management Outreach Attempts: A second unsuccessful outreach was attempted today to offer the patient with information about available complex care management services.  Follow Up Plan:  Additional outreach attempts will be made to offer the patient complex care management information and services.   Encounter Outcome:  No Answer  [name]  [facility], [facility] VBCI Assistant Direct Dial: [phone]  Fax: [phone]

## 2023-12-05 NOTE — Progress Notes (Signed)
 Complex Care Management Note Care Guide Note  12/05/2023 Name: Barbara Lawson MRN: [id number] DOB: 09-Apr-1955   Complex Care Management Outreach Attempts: A third unsuccessful outreach was attempted today to offer the patient with information about available complex care management services.  Follow Up Plan:  No further outreach attempts will be made at this time. We have been unable to contact the patient to offer or enroll patient in complex care management services.  Encounter Outcome:  No Answer  [name]  [facility], [facility] VBCI Assistant Direct Dial: [phone]  Fax: [phone]

## 2023-12-12 NOTE — Progress Notes (Signed)
 Complex Care Management Note  Care Guide Note 12/12/2023 Name: Barbara Lawson MRN: [id number] DOB: 1955/03/06  Barbara Lawson is a 68 y.o. year old female who sees Berneta Elsie Sayre, MD for primary care. I reached out to [employer] by phone today to offer complex care management services.  Barbara Lawson was given information about Complex Care Management services today including:   The Complex Care Management services include support from the care team which includes your Nurse Care Manager, Clinical Social Worker, or Pharmacist.  The Complex Care Management team is here to help remove barriers to the health concerns and goals most important to you. Complex Care Management services are voluntary, and the patient may decline or stop services at any time by request to their care team member.   Complex Care Management Consent Status: Patient did not agree to participate in complex care management services at this time.  Follow up plan:  Patient will follow up with PCP.  Encounter Outcome:  Patient Refused  Dreama Agent [facility], [facility] VBCI Assistant Direct Dial: [phone]  Fax: [phone]

## 2023-12-13 NOTE — Progress Notes (Unsigned)
 [facility] [address] [place] [address] Phone: [phone] Subjective:   ISusannah Gully, am serving as a scribe for Dr. Arthea Claudene.  I'm seeing this patient by the request  of:  Berneta Elsie Sayre, MD  CC: Low back pain  YEP:Dlagzrupcz  10/31/2023 Continue low back and groin pain.  Discussed Hip flexor tightness but patient also has hypermobility.  Discussed hip abductor strengthening as well discussed the importance of stability.   Continue to be active, no med changes  RTCin 3-6 months     Updated 12/18/2023 Brittiany Wiehe is a 68 y.o. female coming in with complaint of LBP. Hip pain on R side much worse. The intensity of the pain has increased. Can get radiating pain down leg. No numbness or tingling. Prolia  check with Brandy.     CT of the head with and without contrast showed no significant intracranial pathology.  NEED LABS   Past Medical History:  Diagnosis Date   ANEMIA-NOS 09/28/2009   Aortic atherosclerosis    ASTHMA 09/28/2009   moderate persistent    CAD (coronary artery disease)    Colon polyps    COPD (chronic obstructive pulmonary disease) (HCC)    COVID-19    01/20/2020   Emphysema lung (HCC)    vs bronchitis;paraseptal    Herpes    Herpes labialis 05/30/2023   HPV in female    hpv 20+ years    Hyperlipemia    HYPOTHYROIDISM 09/28/2009   Lung nodule    Osteoporosis    Pneumonia    hosp in 2018   Thyroid  nodule    right thyroid  5.2 x 1.8 x 1.9 cm right lobe   Past Surgical History:  Procedure Laterality Date   COLONOSCOPY     COLONOSCOPY WITH PROPOFOL  N/A 10/30/2021   Procedure: COLONOSCOPY WITH PROPOFOL ;  Surgeon: Therisa Bi, MD;  [facility] ENDOSCOPY;  Service: Gastroenterology;  Laterality: N/A;   ESOPHAGOGASTRODUODENOSCOPY N/A 10/30/2021   Procedure: ESOPHAGOGASTRODUODENOSCOPY (EGD);  Surgeon: Therisa Bi, MD;  Location: [facility] ENDOSCOPY;  Service: Gastroenterology;  Laterality: N/A;    WRIST SURGERY Right 05/03/2022   Social History   Socioeconomic History   Marital status: Divorced    Spouse name: Not on file   Number of children: Not on file   Years of education: Not on file   Highest education level: Bachelor's degree (e.g., BA, AB, BS)  Occupational History   Not on file  Tobacco Use   Smoking status: Former    Current packs/day: 0.00    Types: Cigarettes    Quit date: 01/26/2013    Years since quitting: 10.9   Smokeless tobacco: Never  Vaping Use   Vaping status: Former  Substance and Sexual Activity   Alcohol use: Yes    Alcohol/week: 3.0 standard drinks of alcohol    Types: 3 Standard drinks or equivalent per week   Drug use: No   Sexual activity: Not Currently  Other Topics Concern   Not on file  Social History Narrative   Lives alone with dog    Moved from [place]    Bachelors degree massage therapy    Age 68 y.o abortion    lmp age 50    Social Drivers of Health   Financial Resource Strain: Low Risk  (11/22/2023)   Overall Financial Resource Strain (CARDIA)    Difficulty of Paying Living Expenses: Not hard at all  Food Insecurity: No Food Insecurity (11/22/2023)   Hunger Vital Sign  Worried About Programme Researcher, Broadcasting/film/video in the Last Year: Never true    Ran Out of Food in the Last Year: Never true  Transportation Needs: No Transportation Needs (11/22/2023)   PRAPARE - Administrator, Civil Service (Medical): No    Lack of Transportation (Non-Medical): No  Physical Activity: Insufficiently Active (11/22/2023)   Exercise Vital Sign    Days of Exercise per Week: 3 days    Minutes of Exercise per Session: 40 min  Stress: Stress Concern Present (11/22/2023)   [employer] of Occupational Health - Occupational Stress Questionnaire    Feeling of Stress: To some extent  Social Connections: Socially Isolated (11/22/2023)   Social Connection and Isolation Panel    Frequency of Communication with Friends and Family: Twice a week     Frequency of Social Gatherings with Friends and Family: Once a week    Attends Religious Services: Never    Database Administrator or Organizations: No    Attends Engineer, Structural: Not on file    Marital Status: Divorced   Allergies  Allergen Reactions   Misc. Sulfonamide Containing Compounds Rash and Swelling    Patient Reports It Only Occurred with The Suppository Formulation, Patient Reports It Only Occurred with The Suppository Formulation, Patient Reports It Only Occurred with The Suppository Formulation   Sulfa Antibiotics Rash and Swelling    Patient Reports It Only Occurred with The Suppository Formulation  Patient Reports It Only Occurred with The Suppository Formulation Patient Reports It Only Occurred with The Suppository Formulation Patient Reports It Only Occurred with The Suppository Formulation  Patient Reports It Only Occurred with The Suppository Formulation  Patient Reports It Only Occurred with The Suppository Formulation Patient Reports It Only Occurred with The Suppository Formulation Patient Reports It Only Occurred with The Suppository Formulation    Patient Reports It Only Occurred with The Suppository Formulation Patient Reports It Only Occurred with The Suppository Formulation Patient Reports It Only Occurred with The Suppository Formulation    Patient Reports It Only Occurred with The Suppository Formulation   Cat Dander    Dust Mite Extract     Other Reaction(s): Other (See Comments)  Bothers asthma     Bothers asthma   Sulfonamide Derivatives    Family History  Problem Relation Age of Onset   Cancer Mother    Asthma Father    Cancer Father    Hyperlipidemia Father    Alzheimer's disease Father    Hyperlipidemia Brother     Current Outpatient Medications (Endocrine & Metabolic):    ARMOUR THYROID  30 MG tablet, Take 30 mg by mouth every morning.   ARMOUR THYROID  60 MG tablet, Take 60 mg by mouth every morning.   progesterone  (PROMETRIUM) 200 MG capsule, Take 300 mg by mouth at bedtime.  Current Outpatient Medications (Cardiovascular):    atorvastatin  (LIPITOR) 10 MG tablet, Take 1 tablet (10 mg total) by mouth daily. (Patient not taking: Reported on 11/26/2023)   colestipol  (COLESTID ) 1 g tablet, Take 2 tablets twice daily with meals   hydrochlorothiazide  (HYDRODIURIL ) 25 MG tablet, Take 0.5 tablets (12.5 mg total) by mouth daily. (Patient not taking: Reported on 11/26/2023)  Current Outpatient Medications (Respiratory):    Fluticasone -Umeclidin-Vilant (TRELEGY ELLIPTA) 100-62.5-25 MCG/ACT AEPB, Inhale into the lungs.   Fluticasone -Umeclidin-Vilant (TRELEGY ELLIPTA) 200-62.5-25 MCG/ACT AEPB, Inhale into the lungs.   ipratropium (ATROVENT) 0.02 % nebulizer solution, Take 500 mcg by nebulization 4 (four) times daily.   Ipratropium-Albuterol (COMBIVENT RESPIMAT)  20-100 MCG/ACT AERS respimat, Inhale 1 puff into the lungs every 6 (six) hours as needed for wheezing.   theophylline  (THEODUR) 300 MG 12 hr tablet, Take 1.5 tablets by mouth 2 (two) times daily.  Current Outpatient Medications (Analgesics):    meloxicam  (MOBIC ) 15 MG tablet, Take 1 tablet (15 mg total) by mouth daily.   Current Outpatient Medications (Other):    gabapentin  (NEURONTIN ) 100 MG capsule, Take 2 capsules (200 mg total) by mouth at bedtime.   acyclovir  ointment (ZOVIRAX ) 5 %, Apply 1 Application topically every 3 (three) hours.   cholecalciferol (VITAMIN D ) 25 MCG (1000 UNIT) tablet, Take 2,000 Units by mouth daily.   clonazePAM (KLONOPIN) 0.5 MG tablet, Take 0.25 mg by mouth at bedtime. Outside prescriber holistic MD Dr. Marsa Lawless   gabapentin  (NEURONTIN ) 100 MG capsule, Take by mouth.   ondansetron  (ZOFRAN ) 4 MG tablet,    valACYclovir  (VALTREX ) 1000 MG tablet, 2 pills x 1 dose then X bid 3-7 days with outbreak until resolution   varenicline  (CHANTIX ) 0.5 MG tablet, PLEASE SEE ATTACHED FOR DETAILED DIRECTIONS   Reviewed prior  external information including notes and imaging from  primary care provider As well as notes that were available from care everywhere and other healthcare systems.  Past medical history, social, surgical and family history all reviewed in electronic medical record.  No pertanent information unless stated regarding to the chief complaint.   Review of Systems:  No headache, visual changes, nausea, vomiting, diarrhea, constipation, dizziness, abdominal pain, skin rash, fevers, chills, night sweats, weight loss, swollen lymph nodes, body aches, joint swelling, chest pain, shortness of breath, mood changes. POSITIVE muscle aches denies any recent change in bowel habits.  Objective  Blood pressure 102/70, pulse 88, height 5' 4 (1.626 m), SpO2 96%.   General: No apparent distress alert and oriented x3 mood and affect normal, dressed appropriately.  HEENT: Pupils equal, extraocular movements intact  Respiratory: Patient's speak in full sentences and does not appear short of breath  Cardiovascular: No lower extremity edema, non tender, no erythema  Low back exam shows loss of lordosis with scoliosis noted.  Tenderness to palpation in the paraspinal musculature.  Patient's right hip positive grind test noted.  Negative fulcrum test noted. Patient does appear to be fairly fatigued   Impression and Recommendations:    The above documentation has been reviewed and is accurate and complete Tiaira Arambula M Latorsha Curling, DO

## 2023-12-18 ENCOUNTER — Ambulatory Visit: Admitting: Family Medicine

## 2023-12-18 ENCOUNTER — Ambulatory Visit

## 2023-12-18 VITALS — BP 102/70 | HR 88 | Ht 64.0 in

## 2023-12-18 DIAGNOSIS — R82994 Hypercalciuria: Secondary | ICD-10-CM

## 2023-12-18 DIAGNOSIS — M25551 Pain in right hip: Secondary | ICD-10-CM

## 2023-12-18 DIAGNOSIS — M810 Age-related osteoporosis without current pathological fracture: Secondary | ICD-10-CM

## 2023-12-18 LAB — COMPREHENSIVE METABOLIC PANEL WITH GFR
ALT: 15 U/L (ref 0–35)
AST: 18 U/L (ref 0–37)
Albumin: 4.6 g/dL (ref 3.5–5.2)
Alkaline Phosphatase: 49 U/L (ref 39–117)
BUN: 16 mg/dL (ref 6–23)
CO2: 26 meq/L (ref 19–32)
Calcium: 9.8 mg/dL (ref 8.4–10.5)
Chloride: 104 meq/L (ref 96–112)
Creatinine, Ser: 0.69 mg/dL (ref 0.40–1.20)
GFR: 89.12 mL/min (ref >60.00)
Glucose, Bld: 95 mg/dL (ref 70–99)
Potassium: 3.6 meq/L (ref 3.5–5.1)
Sodium: 138 meq/L (ref 135–145)
Total Bilirubin: 0.6 mg/dL (ref 0.2–1.2)
Total Protein: 7.6 g/dL (ref 6.0–8.3)

## 2023-12-18 LAB — CBC WITH DIFFERENTIAL/PLATELET
Basophils Absolute: 0.1 K/uL (ref 0.0–0.1)
Basophils Relative: 0.6 % (ref 0.0–3.0)
Eosinophils Absolute: 0.1 K/uL (ref 0.0–0.7)
Eosinophils Relative: 0.9 % (ref 0.0–5.0)
HCT: 40.9 % (ref 36.0–46.0)
Hemoglobin: 13.8 g/dL (ref 12.0–15.0)
Lymphocytes Relative: 29.1 % (ref 12.0–46.0)
Lymphs Abs: 3.5 K/uL (ref 0.7–4.0)
MCHC: 33.7 g/dL (ref 30.0–36.0)
MCV: 89.6 fl (ref 78.0–100.0)
Monocytes Absolute: 1 K/uL (ref 0.1–1.0)
Monocytes Relative: 7.9 % (ref 3.0–12.0)
Neutro Abs: 7.4 K/uL (ref 1.4–7.7)
Neutrophils Relative %: 61.5 % (ref 43.0–77.0)
Platelets: 343 K/uL (ref 150.0–400.0)
RBC: 4.56 Mil/uL (ref 3.87–5.11)
RDW: 13.8 % (ref 11.5–15.5)
WBC: 12.1 K/uL — ABNORMAL HIGH (ref 4.0–10.5)

## 2023-12-18 LAB — VITAMIN D 25 HYDROXY (VIT D DEFICIENCY, FRACTURES): VITD: 53.08 ng/mL (ref 30.00–100.00)

## 2023-12-18 LAB — C-REACTIVE PROTEIN: CRP: 3 mg/dL (ref 0.5–20.0)

## 2023-12-18 LAB — FERRITIN: Ferritin: 53 ng/mL (ref 10.0–291.0)

## 2023-12-18 LAB — SEDIMENTATION RATE: Sed Rate: 26 mm/h (ref 0–30)

## 2023-12-18 LAB — IBC PANEL
Iron: 27 ug/dL — ABNORMAL LOW (ref 42–145)
Saturation Ratios: 6.4 % — ABNORMAL LOW (ref 20.0–50.0)
TIBC: 420 ug/dL (ref 250.0–450.0)
Transferrin: 300 mg/dL (ref 212.0–360.0)

## 2023-12-18 LAB — URIC ACID: Uric Acid, Serum: 4.2 mg/dL (ref 2.4–7.0)

## 2023-12-18 LAB — VITAMIN B12: Vitamin B-12: 547 pg/mL (ref 211–911)

## 2023-12-18 LAB — TSH: TSH: 1.53 u[IU]/mL (ref 0.35–5.50)

## 2023-12-18 MED ORDER — KETOROLAC TROMETHAMINE 60 MG/2ML IM SOLN
60.0000 mg | Freq: Once | INTRAMUSCULAR | Status: AC
  Administered 2023-12-18: 60 mg via INTRAMUSCULAR

## 2023-12-18 MED ORDER — MELOXICAM 15 MG PO TABS: 15.0000 mg | ORAL_TABLET | Freq: Every day | ORAL | 0 refills | Status: DC

## 2023-12-18 MED ORDER — GABAPENTIN 100 MG PO CAPS: 200.0000 mg | ORAL_CAPSULE | Freq: Every day | ORAL | 0 refills | Status: DC

## 2023-12-18 MED ORDER — METHYLPREDNISOLONE ACETATE 80 MG/ML IJ SUSP
80.0000 mg | Freq: Once | INTRAMUSCULAR | Status: AC
  Administered 2023-12-18: 80 mg via INTRAMUSCULAR

## 2023-12-18 NOTE — Patient Instructions (Addendum)
 [facility] [phone] Call Today  When we receive your results we will contact you.  Cocktail full injection today. (NO NSAIDs for 24hrs) Meloxicam  15mg  prescribed  Gabapentin  200mg  prescribed Leotis will have more information about Prolia  when she runs you in January  See you again in 2-3 months

## 2023-12-19 ENCOUNTER — Ambulatory Visit: Payer: Self-pay | Admitting: Family Medicine

## 2023-12-19 NOTE — Assessment & Plan Note (Signed)
 Pain seems to be more localized to the right hip.  Worsening symptoms that is affecting daily activities as well as waking her up at night.  Patient pain seems to be fairly severe and I do feel that advanced imaging would be warranted.  MRI of the lumbar and right hip is necessary.  Will get laboratory workup as well to optimize other things such as patient has previous history of the osteoporosis as well as elevation in calcium  that can be playing a role.  Discussed with patient also that if worsening pain to seek medical attention otherwise.  Given gabapentin  to take at night follow-up with me again in 6 to 12 weeks

## 2023-12-19 NOTE — Assessment & Plan Note (Signed)
 Following up to get Prolia  again in the relatively near future.

## 2023-12-20 LAB — PTH, INTACT AND CALCIUM
Calcium: 10 mg/dL (ref 8.6–10.4)
PTH: 24 pg/mL (ref 16–77)

## 2023-12-21 LAB — ANTI-NUCLEAR AB-TITER (ANA TITER): ANA Titer 1: 1:40 {titer} — ABNORMAL HIGH

## 2023-12-21 LAB — CALCIUM, IONIZED: Calcium, Ion: 5.4 mg/dL (ref 4.7–5.5)

## 2023-12-21 LAB — ANGIOTENSIN CONVERTING ENZYME: Angiotensin-Converting Enzyme: 29 U/L (ref 9–67)

## 2023-12-21 LAB — RHEUMATOID FACTOR: Rheumatoid fact SerPl-aCnc: 11 [IU]/mL (ref <14)

## 2023-12-21 LAB — ANA: Anti Nuclear Antibody (ANA): POSITIVE — AB

## 2023-12-21 LAB — CYCLIC CITRUL PEPTIDE ANTIBODY, IGG: Cyclic Citrullin Peptide Ab: <16 U

## 2023-12-24 ENCOUNTER — Encounter: Payer: Self-pay | Admitting: Family Medicine

## 2024-01-03 ENCOUNTER — Ambulatory Visit

## 2024-01-03 DIAGNOSIS — G8929 Other chronic pain: Secondary | ICD-10-CM

## 2024-01-03 DIAGNOSIS — M545 Low back pain, unspecified: Secondary | ICD-10-CM | POA: Diagnosis not present

## 2024-01-03 MED ORDER — METHYLPREDNISOLONE ACETATE 80 MG/ML IJ SUSP
80.0000 mg | Freq: Once | INTRAMUSCULAR | Status: AC
  Administered 2024-01-03: 80 mg via INTRAMUSCULAR

## 2024-01-03 MED ORDER — KETOROLAC TROMETHAMINE 60 MG/2ML IM SOLN
60.0000 mg | Freq: Once | INTRAMUSCULAR | Status: AC
  Administered 2024-01-03: 60 mg via INTRAMUSCULAR

## 2024-01-03 NOTE — Progress Notes (Signed)
 Patient received Toradol  60 mg in left buttocks and Methylprednisolone  80 mg in right buttocks. Patient tolerated well.

## 2024-01-03 NOTE — Patient Instructions (Signed)
No NSAIDs for 24 hours

## 2024-01-06 NOTE — Telephone Encounter (Signed)
 Specialty Pharmacy  Prolia  VOB initiated via [email or web address]  Next Prolia  inj DUE: 02/02/24

## 2024-01-10 ENCOUNTER — Other Ambulatory Visit: Payer: Self-pay

## 2024-01-10 ENCOUNTER — Other Ambulatory Visit: Payer: Self-pay | Admitting: Family Medicine

## 2024-01-10 DIAGNOSIS — Z87310 Personal history of (healed) osteoporosis fracture: Secondary | ICD-10-CM

## 2024-01-10 DIAGNOSIS — M810 Age-related osteoporosis without current pathological fracture: Secondary | ICD-10-CM

## 2024-01-10 MED ORDER — PROLIA 60 MG/ML ~~LOC~~ SOSY
60.0000 mg | PREFILLED_SYRINGE | SUBCUTANEOUS | 0 refills | Status: DC
  Filled 2024-01-10 – 2024-01-14 (×2): qty 1, 180d supply, fill #0

## 2024-01-14 ENCOUNTER — Other Ambulatory Visit: Payer: Self-pay

## 2024-01-14 ENCOUNTER — Telehealth: Payer: Self-pay

## 2024-01-14 NOTE — Telephone Encounter (Signed)
 Lets get on it

## 2024-01-14 NOTE — Telephone Encounter (Signed)
 Patient's insurance plan no longer covers Prolia . The plan prefers [name] . No PA required at this time and test claim shows copay is $0. Please send a new prescription to [facility]. Thanks!

## 2024-01-15 ENCOUNTER — Telehealth: Payer: Self-pay | Admitting: Family Medicine

## 2024-01-15 ENCOUNTER — Other Ambulatory Visit: Payer: Self-pay

## 2024-01-15 DIAGNOSIS — M810 Age-related osteoporosis without current pathological fracture: Secondary | ICD-10-CM

## 2024-01-15 DIAGNOSIS — Z87310 Personal history of (healed) osteoporosis fracture: Secondary | ICD-10-CM

## 2024-01-15 MED ORDER — DENOSUMAB-BBDZ 60 MG/ML ~~LOC~~ SOSY
60.0000 mg | PREFILLED_SYRINGE | Freq: Once | SUBCUTANEOUS | 0 refills | Status: AC
  Filled 2024-01-15: qty 1, 1d supply, fill #0
  Filled 2024-01-15 – 2024-02-07 (×3): qty 1, 180d supply, fill #0

## 2024-01-15 NOTE — Telephone Encounter (Signed)
 RX already sent to the pharmacy for [name] .

## 2024-01-15 NOTE — Telephone Encounter (Signed)
 RX sent to [facility].

## 2024-01-17 ENCOUNTER — Other Ambulatory Visit (HOSPITAL_COMMUNITY): Payer: Self-pay

## 2024-01-20 ENCOUNTER — Other Ambulatory Visit (HOSPITAL_COMMUNITY): Payer: Self-pay

## 2024-01-24 NOTE — Telephone Encounter (Signed)
 SABRA

## 2024-01-30 ENCOUNTER — Other Ambulatory Visit (HOSPITAL_COMMUNITY): Payer: Self-pay

## 2024-01-30 ENCOUNTER — Other Ambulatory Visit: Payer: Self-pay

## 2024-01-31 NOTE — Telephone Encounter (Signed)
 Prior Authorization initiated for PROLIA  via [email or web address] KEY: [id number]

## 2024-01-31 NOTE — Telephone Encounter (Addendum)
 Pharmacy Benefit  Prior Authorization REQUIRED for PROLIA 

## 2024-02-03 NOTE — Telephone Encounter (Signed)
 VOB initiated for Barbara Lawson  via [employer] Provider Benefits Portal.   Checking pharmacy and medical benefits

## 2024-02-03 NOTE — Telephone Encounter (Signed)
 Request received for additional information this morning. Also received denial letter stating that additional information was not received. PA was initiated around noon on Friday, did not receive request for additional information until this morning. Additional information provided and placed at the front desk for faxing.   May be required to change to [name] . Will check VOB for [name] .

## 2024-02-04 NOTE — Telephone Encounter (Signed)
 Dr. Joane, OK to change treatment to [name] ?  The following medications are covered: (a) Alendronate tablet. (b) Ibandronate. (c) [name] . (d) Risedronate.

## 2024-02-05 NOTE — Telephone Encounter (Signed)
 Received a questionnaire from insurance for Prolia . Completed and faxed back.   Will need to wait on attempting prior auth for [name]  until determination has been received about Prolia .

## 2024-02-05 NOTE — Telephone Encounter (Signed)
 Okay to switch to [name] 

## 2024-02-05 NOTE — Telephone Encounter (Signed)
 Medical Buy and Bill  Prior Authorization REQUIRED for [name]          Pharmacy Benefits - undisclosed - [employer]

## 2024-02-06 ENCOUNTER — Other Ambulatory Visit: Payer: Self-pay

## 2024-02-07 ENCOUNTER — Other Ambulatory Visit: Payer: Self-pay

## 2024-02-07 ENCOUNTER — Other Ambulatory Visit (HOSPITAL_COMMUNITY): Payer: Self-pay

## 2024-02-07 NOTE — Telephone Encounter (Signed)
 Patient called stating that she has spoken to [facility] and Jubanti will be delivered on Tuesday, 1/20. Patient is scheduled on 1/22 (with Smith visit)  FYI

## 2024-02-07 NOTE — Progress Notes (Signed)
 Specialty Pharmacy Refill Coordination Note  Barbara Lawson is a 69 y.o. female contacted today regarding refills of specialty medication(s) Denosumab -bbdz ([name] )   Patient requested Courier to Provider Office   Delivery date: 02/11/24   Verified address: [facility] [address]   Medication will be filled on: 02/10/24

## 2024-02-10 ENCOUNTER — Other Ambulatory Visit: Payer: Self-pay

## 2024-02-10 NOTE — Telephone Encounter (Signed)
 Noted, thank you.

## 2024-02-10 NOTE — Telephone Encounter (Signed)
 Dorette Johnetta PARAS [facility]   02/07/24  1:47 PM Note Patient called stating that she has spoken to [facility] and Jubanti will be delivered on Tuesday, 1/20. Patient is scheduled on 1/22 (with Smith visit)   FYI     Me    01/15/24  8:43 AM Note RX already sent to the pharmacy for [name] .

## 2024-02-12 NOTE — Progress Notes (Unsigned)
 " [facility] [address] [place] [address] Phone: [phone] Subjective:   Barbara Lawson am a scribe for Dr. Claudene.   I'm seeing this patient by the request  of:  Berneta Elsie Sayre, MD  CC: Right hip pain  YEP:Dlagzrupcz  12/18/2023 Pain seems to be more localized to the right hip.  Worsening symptoms that is affecting daily activities as well as waking her up at night.  Patient pain seems to be fairly severe and I do feel that advanced imaging would be warranted.  MRI of the lumbar and right hip is necessary.  Will get laboratory workup as well to optimize other things such as patient has previous history of the osteoporosis as well as elevation in calcium  that can be playing a role.  Discussed with patient also that if worsening pain to seek medical attention otherwise.  Given gabapentin  to take at night follow-up with me again in 6 to 12 weeks     Following up to get Prolia  again in the relatively near future.     Updated 02/13/2024 Barbara Lawson is a 69 y.o. female coming in with complaint of hip pain. Patient states that today the hip is a dull roar. It really does depend on the day. Some days are worse than others. Also here for [name]  injection.   X-rays of the lumbar spine shows mild to moderate degenerative changes.  Patient was to have an MRI of the hip as well as an MRI lumbar.  This has not been done.    Past Medical History:  Diagnosis Date   ANEMIA-NOS 09/28/2009   Aortic atherosclerosis    ASTHMA 09/28/2009   moderate persistent    CAD (coronary artery disease)    Colon polyps    COPD (chronic obstructive pulmonary disease) (HCC)    COVID-19    01/20/2020   Emphysema lung (HCC)    vs bronchitis;paraseptal    Herpes    Herpes labialis 05/30/2023   HPV in female    hpv 20+ years    Hyperlipemia    HYPOTHYROIDISM 09/28/2009   Lung nodule    Osteoporosis    Pneumonia    hosp in 2018   Thyroid  nodule     right thyroid  5.2 x 1.8 x 1.9 cm right lobe   Past Surgical History:  Procedure Laterality Date   COLONOSCOPY     COLONOSCOPY WITH PROPOFOL  N/A 10/30/2021   Procedure: COLONOSCOPY WITH PROPOFOL ;  Surgeon: Therisa Bi, MD;  Location: [facility] ENDOSCOPY;  Service: Gastroenterology;  Laterality: N/A;   ESOPHAGOGASTRODUODENOSCOPY N/A 10/30/2021   Procedure: ESOPHAGOGASTRODUODENOSCOPY (EGD);  Surgeon: Therisa Bi, MD;  Location: [facility] ENDOSCOPY;  Service: Gastroenterology;  Laterality: N/A;   WRIST SURGERY Right 05/03/2022   Social History   Socioeconomic History   Marital status: Divorced    Spouse name: Not on file   Number of children: Not on file   Years of education: Not on file   Highest education level: Bachelor's degree (e.g., BA, AB, BS)  Occupational History   Not on file  Tobacco Use   Smoking status: Former    Current packs/day: 0.00    Average packs/day: 1.0 packs/day    Types: Cigarettes    Quit date: 01/26/2013    Years since quitting: 11.0   Smokeless tobacco: Never  Vaping Use   Vaping status: Former  Substance and Sexual Activity   Alcohol use: Yes    Alcohol/week: 3.0 standard drinks of alcohol  Types: 3 Standard drinks or equivalent per week   Drug use: No   Sexual activity: Not Currently  Other Topics Concern   Not on file  Social History Narrative   Lives alone with dog    Moved from [place]    Bachelors degree massage therapy    Age 45 y.o abortion    lmp age 4    Social Drivers of Health   Tobacco Use: Medium Risk (11/26/2023)   Patient History    Smoking Tobacco Use: Former    Smokeless Tobacco Use: Never    Passive Exposure: Not on Actuary Strain: Low Risk (11/22/2023)   Overall Financial Resource Strain (CARDIA)    Difficulty of Paying Living Expenses: Not hard at all  Food Insecurity: No Food Insecurity (11/22/2023)   Epic    Worried About Programme Researcher, Broadcasting/film/video in the Last Year: Never true    Ran Out of Food in the Last Year:  Never true  Transportation Needs: No Transportation Needs (11/22/2023)   Epic    Lack of Transportation (Medical): No    Lack of Transportation (Non-Medical): No  Physical Activity: Insufficiently Active (11/22/2023)   Exercise Vital Sign    Days of Exercise per Week: 3 days    Minutes of Exercise per Session: 40 min  Stress: Stress Concern Present (11/22/2023)   [employer] of Occupational Health - Occupational Stress Questionnaire    Feeling of Stress: To some extent  Social Connections: Socially Isolated (11/22/2023)   Social Connection and Isolation Panel    Frequency of Communication with Friends and Family: Twice a week    Frequency of Social Gatherings with Friends and Family: Once a week    Attends Religious Services: Never    Database Administrator or Organizations: No    Attends Engineer, Structural: Not on file    Marital Status: Divorced  Depression (PHQ2-9): Low Risk (10/29/2023)   Depression (PHQ2-9)    PHQ-2 Score: 3  Alcohol Screen: Low Risk (11/22/2023)   Alcohol Screen    Last Alcohol Screening Score (AUDIT): 2  Housing: Low Risk (11/22/2023)   Epic    Unable to Pay for Housing in the Last Year: No    Number of Times Moved in the Last Year: 0    Homeless in the Last Year: No  Utilities: Not At Risk (10/25/2023)   Epic    Threatened with loss of utilities: No  Health Literacy: Adequate Health Literacy (10/25/2023)   B1300 Health Literacy    Frequency of need for help with medical instructions: Never   Allergies[1] Family History  Problem Relation Age of Onset   Cancer Mother    Asthma Father    Cancer Father    Hyperlipidemia Father    Alzheimer's disease Father    Hyperlipidemia Brother     Current Outpatient Medications (Endocrine & Metabolic):    ARMOUR THYROID  30 MG tablet, Take 30 mg by mouth every morning.   ARMOUR THYROID  60 MG tablet, Take 60 mg by mouth every morning.   progesterone (PROMETRIUM) 200 MG capsule, Take 300 mg by  mouth at bedtime.  Current Outpatient Medications (Cardiovascular):    atorvastatin  (LIPITOR) 10 MG tablet, Take 1 tablet (10 mg total) by mouth daily. (Patient not taking: Reported on 11/26/2023)   colestipol  (COLESTID ) 1 g tablet, Take 2 tablets twice daily with meals   hydrochlorothiazide  (HYDRODIURIL ) 25 MG tablet, Take 0.5 tablets (12.5 mg total) by mouth daily. (Patient  not taking: Reported on 11/26/2023)  Current Outpatient Medications (Respiratory):    Fluticasone -Umeclidin-Vilant (TRELEGY ELLIPTA) 100-62.5-25 MCG/ACT AEPB, Inhale into the lungs.   Fluticasone -Umeclidin-Vilant (TRELEGY ELLIPTA) 200-62.5-25 MCG/ACT AEPB, Inhale into the lungs.   ipratropium (ATROVENT) 0.02 % nebulizer solution, Take 500 mcg by nebulization 4 (four) times daily.   Ipratropium-Albuterol (COMBIVENT RESPIMAT) 20-100 MCG/ACT AERS respimat, Inhale 1 puff into the lungs every 6 (six) hours as needed for wheezing.   theophylline  (THEODUR) 300 MG 12 hr tablet, Take 1.5 tablets by mouth 2 (two) times daily.  Current Outpatient Medications (Analgesics):    meloxicam  (MOBIC ) 15 MG tablet, Take 1 tablet (15 mg total) by mouth daily.  Current Outpatient Medications (Other):    acyclovir  ointment (ZOVIRAX ) 5 %, Apply 1 Application topically every 3 (three) hours.   cephALEXin  (KEFLEX ) 500 MG capsule, Take 1 capsule (500 mg total) by mouth 2 (two) times daily.   cholecalciferol (VITAMIN D ) 25 MCG (1000 UNIT) tablet, Take 2,000 Units by mouth daily.   clonazePAM (KLONOPIN) 0.5 MG tablet, Take 0.25 mg by mouth at bedtime. Outside prescriber holistic MD Dr. Marsa Lawless   gabapentin  (NEURONTIN ) 100 MG capsule, Take by mouth.   gabapentin  (NEURONTIN ) 100 MG capsule, Take 2 capsules (200 mg total) by mouth at bedtime.   ondansetron  (ZOFRAN ) 4 MG tablet,    valACYclovir  (VALTREX ) 1000 MG tablet, 2 pills x 1 dose then X bid 3-7 days with outbreak until resolution   varenicline  (CHANTIX ) 0.5 MG tablet, PLEASE SEE  ATTACHED FOR DETAILED DIRECTIONS   Reviewed prior external information including notes and imaging from  primary care provider As well as notes that were available from care everywhere and other healthcare systems.  Past medical history, social, surgical and family history all reviewed in electronic medical record.  No pertanent information unless stated regarding to the chief complaint.   Review of Systems:  No headache, visual changes, nausea, vomiting, diarrhea, constipation, dizziness, abdominal pain, skin rash, fevers, chills, night sweats, weight loss, swollen lymph nodes, body aches, joint swelling, chest pain, shortness of breath, mood changes. POSITIVE muscle aches  Objective  Blood pressure 138/80, pulse 73, height 5' 4 (1.626 m), weight 131 lb 9.6 oz (59.7 kg), SpO2 97%.   General: No apparent distress alert and oriented x3 mood and affect normal, dressed appropriately.  HEENT: Pupils equal, extraocular movements intact  Respiratory: Patient's speak in full sentences and does not appear short of breath  Cardiovascular: No lower extremity edema, non tender, no erythema  Back exam shows scoliosis noted.  Tender to palpation in the paraspinal musculature.  Hip exam shows patient does have some loss of lordosis noted.  Some tenderness to palpation in the paraspinal musculature.    Impression and Recommendations:     The above documentation has been reviewed and is accurate and complete Arthea CHRISTELLA Sharps, DO       [1]  Allergies Allergen Reactions   Misc. Sulfonamide Containing Compounds Rash and Swelling    Patient Reports It Only Occurred with The Suppository Formulation, Patient Reports It Only Occurred with The Suppository Formulation, Patient Reports It Only Occurred with The Suppository Formulation   Sulfa Antibiotics Rash and Swelling    Patient Reports It Only Occurred with The Suppository Formulation  Patient Reports It Only Occurred with The Suppository  Formulation Patient Reports It Only Occurred with The Suppository Formulation Patient Reports It Only Occurred with The Suppository Formulation  Patient Reports It Only Occurred with The Suppository Formulation  Patient Reports It  Only Occurred with The Suppository Formulation Patient Reports It Only Occurred with The Suppository Formulation Patient Reports It Only Occurred with The Suppository Formulation    Patient Reports It Only Occurred with The Suppository Formulation Patient Reports It Only Occurred with The Suppository Formulation Patient Reports It Only Occurred with The Suppository Formulation    Patient Reports It Only Occurred with The Suppository Formulation   Cat Dander    Dust Mite Extract     Other Reaction(s): Other (See Comments)  Bothers asthma     Bothers asthma   Sulfonamide Derivatives    "

## 2024-02-13 ENCOUNTER — Ambulatory Visit (INDEPENDENT_AMBULATORY_CARE_PROVIDER_SITE_OTHER): Admitting: Family Medicine

## 2024-02-13 ENCOUNTER — Ambulatory Visit: Payer: Self-pay | Admitting: Family Medicine

## 2024-02-13 ENCOUNTER — Ambulatory Visit: Admitting: Family Medicine

## 2024-02-13 ENCOUNTER — Encounter: Payer: Self-pay | Admitting: Family Medicine

## 2024-02-13 VITALS — BP 138/80 | HR 73 | Ht 64.0 in | Wt 131.6 lb

## 2024-02-13 DIAGNOSIS — M7601 Gluteal tendinitis, right hip: Secondary | ICD-10-CM

## 2024-02-13 DIAGNOSIS — M810 Age-related osteoporosis without current pathological fracture: Secondary | ICD-10-CM | POA: Diagnosis not present

## 2024-02-13 DIAGNOSIS — Z87310 Personal history of (healed) osteoporosis fracture: Secondary | ICD-10-CM | POA: Diagnosis not present

## 2024-02-13 DIAGNOSIS — R5383 Other fatigue: Secondary | ICD-10-CM

## 2024-02-13 LAB — URINALYSIS, ROUTINE W REFLEX MICROSCOPIC
Bilirubin Urine: NEGATIVE
Hgb urine dipstick: NEGATIVE
Ketones, ur: NEGATIVE
Nitrite: NEGATIVE
RBC / HPF: NONE SEEN
Specific Gravity, Urine: 1.01 (ref 1.000–1.030)
Total Protein, Urine: NEGATIVE
Urine Glucose: NEGATIVE
Urobilinogen, UA: 0.2 (ref 0.0–1.0)
pH: 7.5 (ref 5.0–8.0)

## 2024-02-13 MED ORDER — DENOSUMAB-BBDZ 60 MG/ML ~~LOC~~ SOSY
60.0000 mg | PREFILLED_SYRINGE | Freq: Once | SUBCUTANEOUS | Status: AC
  Administered 2024-02-13: 60 mg via SUBCUTANEOUS

## 2024-02-13 MED ORDER — MELOXICAM 15 MG PO TABS: 15.0000 mg | ORAL_TABLET | Freq: Every day | ORAL | 0 refills | Status: AC

## 2024-02-13 MED ORDER — CEPHALEXIN 500 MG PO CAPS: 500.0000 mg | ORAL_CAPSULE | Freq: Two times a day (BID) | ORAL | 0 refills | Status: AC

## 2024-02-13 MED ORDER — GABAPENTIN 100 MG PO CAPS: 200.0000 mg | ORAL_CAPSULE | Freq: Every day | ORAL | 0 refills | Status: AC

## 2024-02-13 NOTE — Assessment & Plan Note (Signed)
 Discussed with patient at great length.  Discussed the potential advanced imaging including pelvis and MRI.  Patient would like to hold on it at the moment.  Patient is getting chelation and feels like it may be beneficial.  We discussed having the provider though check heavy metals as well as different cations that could potentially be chelated out patient is in agreement with this.

## 2024-02-13 NOTE — Assessment & Plan Note (Signed)
 Patient given injection today.  Ionized calcium  has been within the normal range in the last 2 months

## 2024-02-13 NOTE — Patient Instructions (Addendum)
 Good to see you. [name]  given today. Come back again in 6 months for your next shot.  Refill meloxicam  and gabapentin . Recheck Zinc, calcium , and iron next time you go to your other doctor. Recommend 30 g zinc and 3600 mg tart cherry. Continue vitamin D .  See me again in 3 months.

## 2024-02-14 NOTE — Telephone Encounter (Signed)
 Last [name]  injection 02/13/24 Next [name]  inj due 08/13/24

## 2024-02-19 ENCOUNTER — Other Ambulatory Visit: Payer: Self-pay

## 2024-03-31 ENCOUNTER — Ambulatory Visit: Admitting: Family Medicine

## 2024-05-13 ENCOUNTER — Ambulatory Visit: Admitting: Family Medicine

## 2024-10-26 ENCOUNTER — Ambulatory Visit
